# Patient Record
Sex: Male | Born: 1965 | Hispanic: Yes | Marital: Single | Smoking: Never smoker
Health system: Southern US, Community
[De-identification: ages and names within clinical notes are randomized; demographics above are authoritative.]

## PROBLEM LIST (undated history)

## (undated) DIAGNOSIS — E119 Type 2 diabetes mellitus without complications: Secondary | ICD-10-CM

---

## 2020-01-12 ENCOUNTER — Inpatient Hospital Stay
Admission: EM | Admit: 2020-01-12 | Discharge: 2020-01-16 | DRG: 439 | Disposition: A | Discharge status: Home Health | Payer: Self-pay | Attending: Internal Medicine | Admitting: Internal Medicine

## 2020-01-12 ENCOUNTER — Emergency Department: Payer: Self-pay

## 2020-01-12 DIAGNOSIS — E1165 Type 2 diabetes mellitus with hyperglycemia: Secondary | ICD-10-CM | POA: Diagnosis present

## 2020-01-12 DIAGNOSIS — K567 Ileus, unspecified: Secondary | ICD-10-CM | POA: Diagnosis present

## 2020-01-12 DIAGNOSIS — E440 Moderate protein-calorie malnutrition: Secondary | ICD-10-CM | POA: Diagnosis present

## 2020-01-12 DIAGNOSIS — R7989 Other specified abnormal findings of blood chemistry: Secondary | ICD-10-CM | POA: Diagnosis present

## 2020-01-12 DIAGNOSIS — N179 Acute kidney failure, unspecified: Secondary | ICD-10-CM | POA: Diagnosis present

## 2020-01-12 DIAGNOSIS — F10129 Alcohol abuse with intoxication, unspecified: Secondary | ICD-10-CM

## 2020-01-12 DIAGNOSIS — R739 Hyperglycemia, unspecified: Secondary | ICD-10-CM

## 2020-01-12 DIAGNOSIS — F10229 Alcohol dependence with intoxication, unspecified: Secondary | ICD-10-CM | POA: Diagnosis present

## 2020-01-12 DIAGNOSIS — Z20822 Contact with and (suspected) exposure to covid-19: Secondary | ICD-10-CM | POA: Diagnosis present

## 2020-01-12 DIAGNOSIS — K852 Alcohol induced acute pancreatitis without necrosis or infection: Principal | ICD-10-CM

## 2020-01-12 DIAGNOSIS — E871 Hypo-osmolality and hyponatremia: Secondary | ICD-10-CM

## 2020-01-12 DIAGNOSIS — E781 Pure hyperglyceridemia: Secondary | ICD-10-CM | POA: Diagnosis present

## 2020-01-12 DIAGNOSIS — D6959 Other secondary thrombocytopenia: Secondary | ICD-10-CM | POA: Diagnosis present

## 2020-01-12 DIAGNOSIS — R197 Diarrhea, unspecified: Secondary | ICD-10-CM | POA: Diagnosis present

## 2020-01-12 DIAGNOSIS — Y904 Blood alcohol level of 80-99 mg/100 ml: Secondary | ICD-10-CM | POA: Diagnosis present

## 2020-01-12 DIAGNOSIS — K859 Acute pancreatitis without necrosis or infection, unspecified: Secondary | ICD-10-CM | POA: Diagnosis present

## 2020-01-12 DIAGNOSIS — Z6824 Body mass index (BMI) 24.0-24.9, adult: Secondary | ICD-10-CM

## 2020-01-12 DIAGNOSIS — E872 Acidosis: Secondary | ICD-10-CM | POA: Diagnosis present

## 2020-01-12 DIAGNOSIS — K760 Fatty (change of) liver, not elsewhere classified: Secondary | ICD-10-CM | POA: Diagnosis present

## 2020-01-12 LAB — COMPREHENSIVE METABOLIC PANEL
ALT: 52 U/L — ABNORMAL HIGH (ref 0–44)
ALT: 56 U/L — ABNORMAL HIGH (ref 0–44)
ALT: 45 U/L — ABNORMAL HIGH (ref 0–44)
AST: 72 U/L — ABNORMAL HIGH (ref 15–41)
AST: 82 U/L — ABNORMAL HIGH (ref 15–41)
AST: 72 U/L — ABNORMAL HIGH (ref 15–41)
Albumin: 3.3 g/dL — ABNORMAL LOW (ref 3.5–5.0)
Albumin: 3.3 g/dL — ABNORMAL LOW (ref 3.5–5.0)
Albumin: 2.6 g/dL — ABNORMAL LOW (ref 3.5–5.0)
Alkaline Phosphatase: 156 U/L — ABNORMAL HIGH (ref 38–126)
Alkaline Phosphatase: 160 U/L — ABNORMAL HIGH (ref 38–126)
Alkaline Phosphatase: 127 U/L — ABNORMAL HIGH (ref 38–126)
Anion gap: 19 — ABNORMAL HIGH (ref 5–15)
Anion gap: 18 — ABNORMAL HIGH (ref 5–15)
Anion gap: 14 (ref 5–15)
BUN: 22 mg/dL — ABNORMAL HIGH (ref 6–20)
BUN: 23 mg/dL — ABNORMAL HIGH (ref 6–20)
BUN: 21 mg/dL — ABNORMAL HIGH (ref 6–20)
CO2: 15 mmol/L — ABNORMAL LOW (ref 22–32)
CO2: 21 mmol/L — ABNORMAL LOW (ref 22–32)
CO2: 19 mmol/L — ABNORMAL LOW (ref 22–32)
Calcium: 8.3 mg/dL — ABNORMAL LOW (ref 8.9–10.3)
Calcium: 8.1 mg/dL — ABNORMAL LOW (ref 8.9–10.3)
Calcium: 7.1 mg/dL — ABNORMAL LOW (ref 8.9–10.3)
Chloride: 97 mmol/L — ABNORMAL LOW (ref 98–111)
Chloride: 93 mmol/L — ABNORMAL LOW (ref 98–111)
Chloride: 101 mmol/L (ref 98–111)
Creatinine, Ser: 0.77 mg/dL (ref 0.61–1.24)
Creatinine, Ser: 0.76 mg/dL (ref 0.61–1.24)
Creatinine, Ser: 0.69 mg/dL (ref 0.61–1.24)
GFR, Estimated: >60 mL/min (ref >60)
GFR, Estimated: >60 mL/min (ref >60)
GFR, Estimated: >60 mL/min (ref >60)
Glucose, Bld: 341 mg/dL — ABNORMAL HIGH (ref 70–99)
Glucose, Bld: 311 mg/dL — ABNORMAL HIGH (ref 70–99)
Glucose, Bld: 218 mg/dL — ABNORMAL HIGH (ref 70–99)
Potassium: 4 mmol/L (ref 3.5–5.1)
Potassium: 3.8 mmol/L (ref 3.5–5.1)
Potassium: 3.8 mmol/L (ref 3.5–5.1)
Sodium: 131 mmol/L — ABNORMAL LOW (ref 135–145)
Sodium: 132 mmol/L — ABNORMAL LOW (ref 135–145)
Sodium: 134 mmol/L — ABNORMAL LOW (ref 135–145)
Total Bilirubin: 1.6 mg/dL — ABNORMAL HIGH (ref 0.3–1.2)
Total Bilirubin: 1.8 mg/dL — ABNORMAL HIGH (ref 0.3–1.2)
Total Bilirubin: 1.7 mg/dL — ABNORMAL HIGH (ref 0.3–1.2)
Total Protein: 6.4 g/dL — ABNORMAL LOW (ref 6.5–8.1)
Total Protein: 6.8 g/dL (ref 6.5–8.1)
Total Protein: 5.3 g/dL — ABNORMAL LOW (ref 6.5–8.1)

## 2020-01-12 LAB — CBC
HCT: 38.2 % — ABNORMAL LOW (ref 39.0–52.0)
HCT: 37.7 % — ABNORMAL LOW (ref 39.0–52.0)
Hemoglobin: 13.9 g/dL (ref 13.0–17.0)
Hemoglobin: 13.6 g/dL (ref 13.0–17.0)
MCH: 34.2 pg — ABNORMAL HIGH (ref 26.0–34.0)
MCH: 34.3 pg — ABNORMAL HIGH (ref 26.0–34.0)
MCHC: 36.4 g/dL — ABNORMAL HIGH (ref 30.0–36.0)
MCHC: 36.1 g/dL — ABNORMAL HIGH (ref 30.0–36.0)
MCV: 93.9 fL (ref 80.0–100.0)
MCV: 95 fL (ref 80.0–100.0)
Platelets: 204 10*3/uL (ref 150–400)
Platelets: 173 10*3/uL (ref 150–400)
RBC: 4.07 MIL/uL — ABNORMAL LOW (ref 4.22–5.81)
RBC: 3.97 MIL/uL — ABNORMAL LOW (ref 4.22–5.81)
RDW: 13.1 % (ref 11.5–15.5)
RDW: 13.2 % (ref 11.5–15.5)
WBC: 5.9 10*3/uL (ref 4.0–10.5)
WBC: 8.1 10*3/uL (ref 4.0–10.5)
nRBC: 0 % (ref 0.0–0.2)
nRBC: 0 % (ref 0.0–0.2)

## 2020-01-12 LAB — URINALYSIS, COMPLETE (UACMP) WITH MICROSCOPIC
Bacteria, UA: NONE SEEN
Bilirubin Urine: NEGATIVE
Glucose, UA: >=500 mg/dL — AB
Hgb urine dipstick: NEGATIVE
Ketones, ur: NEGATIVE mg/dL
Leukocytes,Ua: NEGATIVE
Nitrite: NEGATIVE
Protein, ur: NEGATIVE mg/dL
Specific Gravity, Urine: 1.022 (ref 1.005–1.030)
pH: 5 (ref 5.0–8.0)

## 2020-01-12 LAB — PHOSPHORUS: Phosphorus: 3 mg/dL (ref 2.5–4.6)

## 2020-01-12 LAB — MAGNESIUM: Magnesium: 1.9 mg/dL (ref 1.7–2.4)

## 2020-01-12 LAB — ETHANOL: Alcohol, Ethyl (B): 96 mg/dL — ABNORMAL HIGH (ref <10)

## 2020-01-12 LAB — LIPASE, BLOOD: Lipase: 726 U/L — ABNORMAL HIGH (ref 11–51)

## 2020-01-12 LAB — CBG MONITORING, ED: Glucose-Capillary: 206 mg/dL — ABNORMAL HIGH (ref 70–99)

## 2020-01-12 LAB — RESPIRATORY PANEL BY RT PCR (FLU A&B, COVID)
Influenza A by PCR: NEGATIVE
Influenza B by PCR: NEGATIVE
SARS Coronavirus 2 by RT PCR: NEGATIVE

## 2020-01-12 IMAGING — CT CT ABD-PELV W/ CM
2 of 5 series · 15 of 46 positions shown, 17 images · IV contrast (omnipaque)
Comparison: None.

CLINICAL DATA: Diffuse abdominal pain, nausea and vomiting

EXAM:
CT ABDOMEN AND PELVIS WITH CONTRAST
TECHNIQUE: Multidetector CT imaging of the abdomen and pelvis was performed
using the standard protocol following bolus administration of
intravenous contrast.
CONTRAST:  100mL OMNIPAQUE IOHEXOL 300 MG/ML  SOLN

[Series 2: routine abd/pel with · axial · 0.70mm/px · z∈[-816,-391]mm · 12 of 95 slices shown, 14 images]
[im 5/95  soft-tissue]
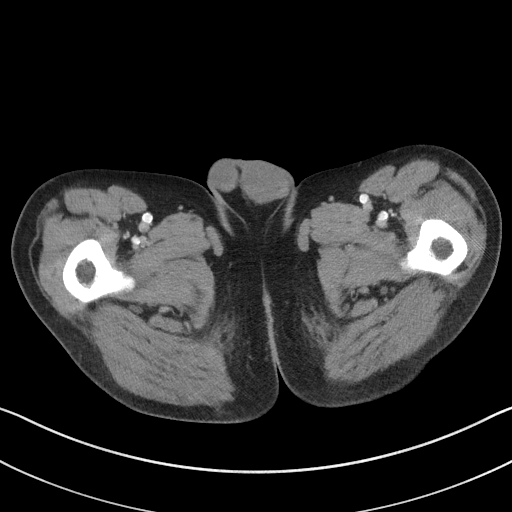
[im 5/95  bone]
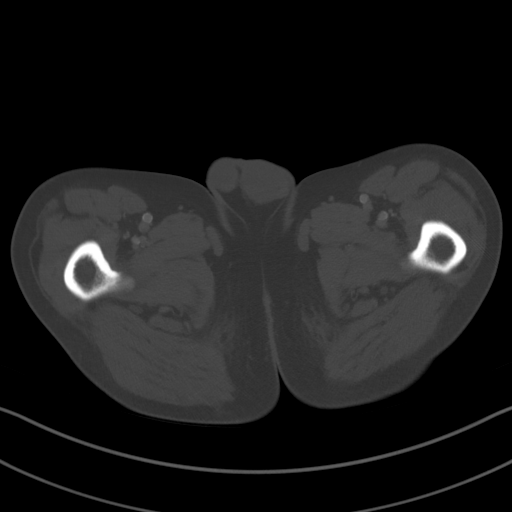
[im 15/95  soft-tissue]
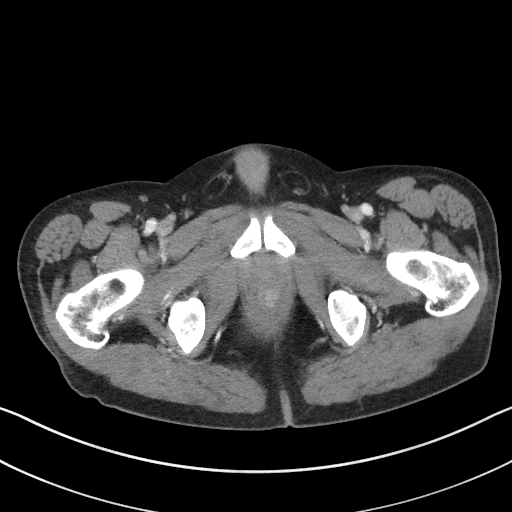
[im 20/95  soft-tissue]
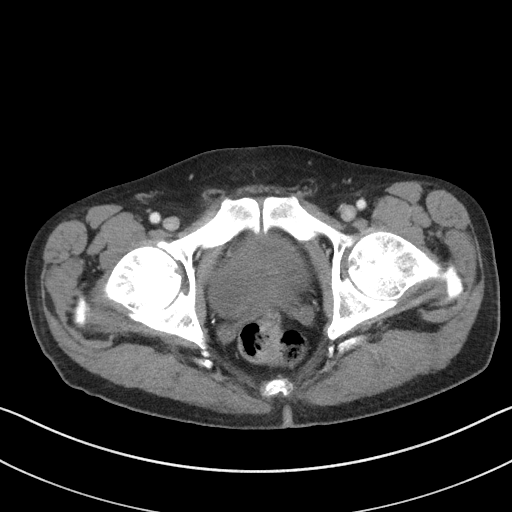
[im 30/95  soft-tissue]
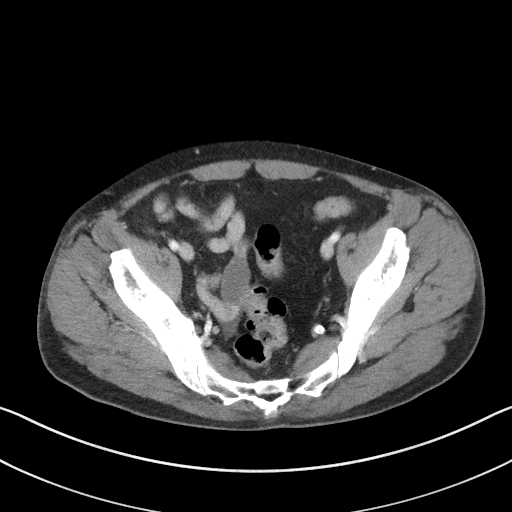
[im 35/95  soft-tissue]
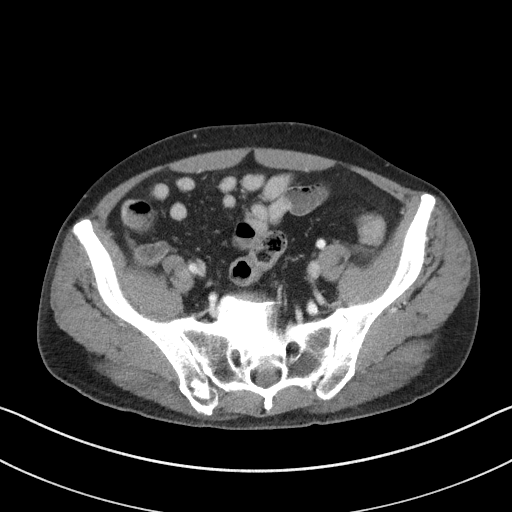
[im 45/95  soft-tissue]
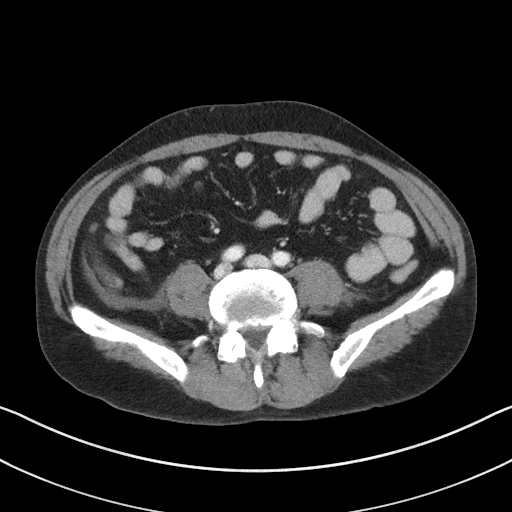
[im 50/95  soft-tissue]
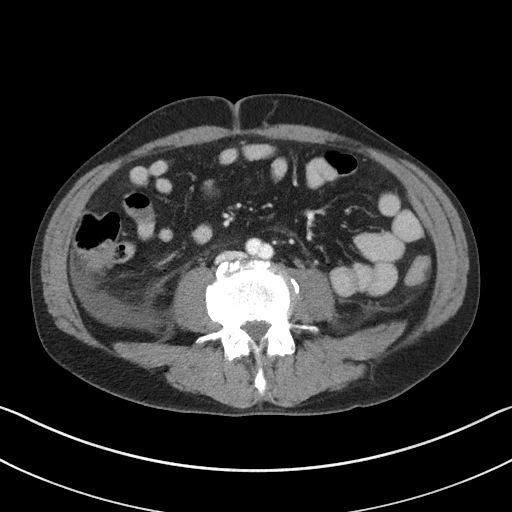
[im 60/95  soft-tissue]
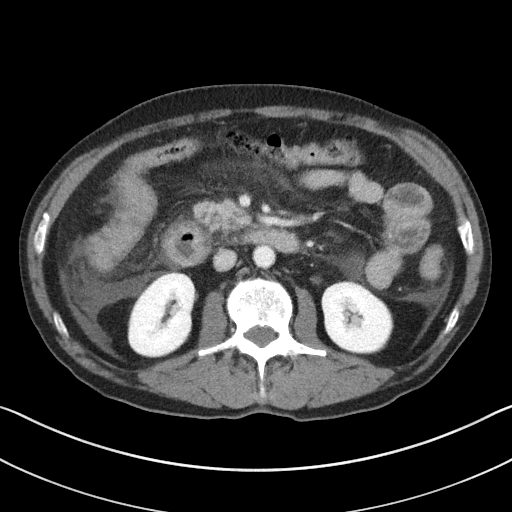
[im 65/95  soft-tissue]
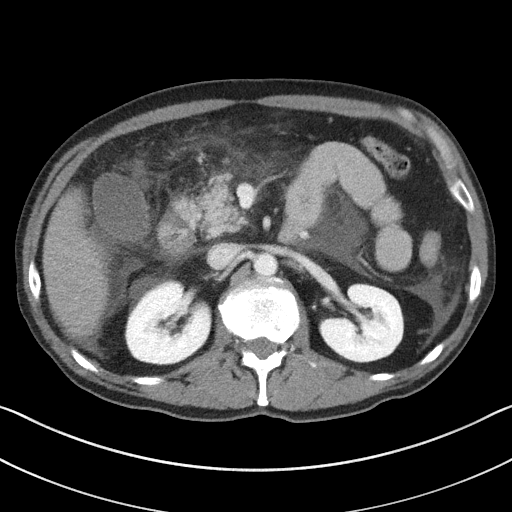
[im 65/95  bone]
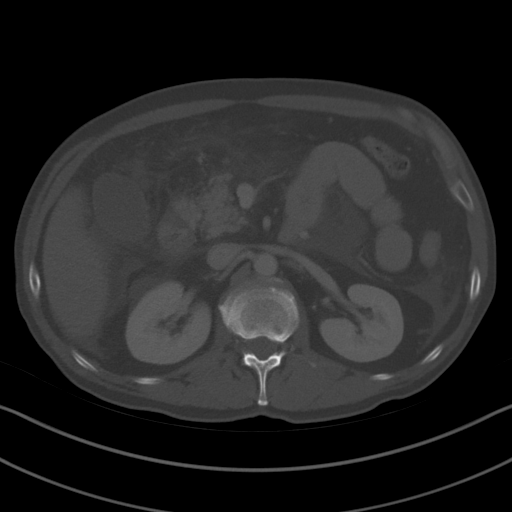
[im 75/95  soft-tissue]
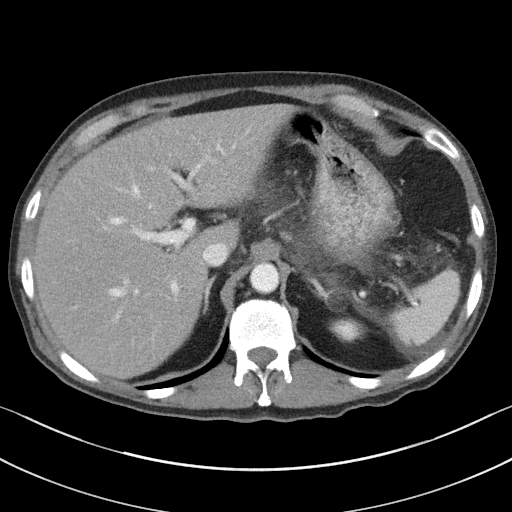
[im 80/95  soft-tissue]
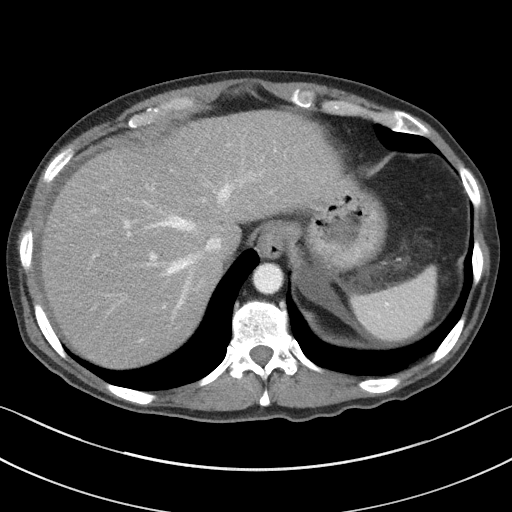
[im 90/95  soft-tissue]
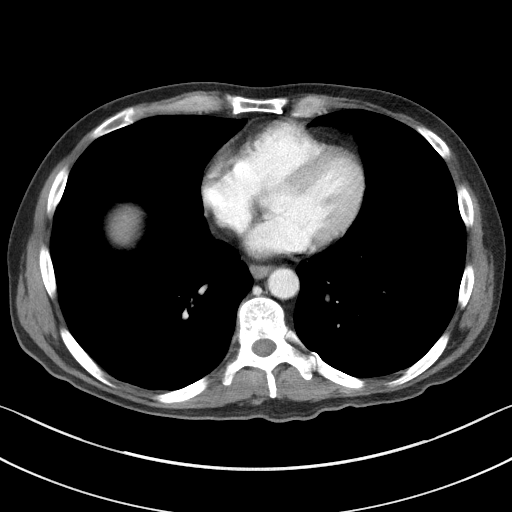

[Series 5: coronal st · coronal · 0.70mm/px · 3 of 86 slices shown]
[im 29/86  soft-tissue]
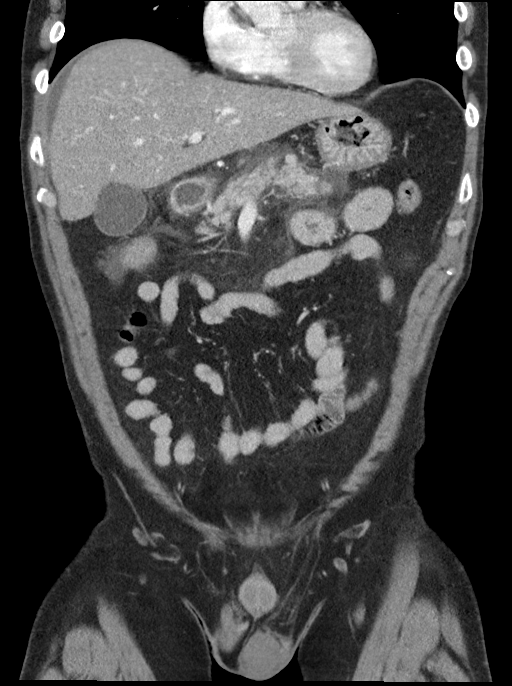
[im 38/86  soft-tissue]
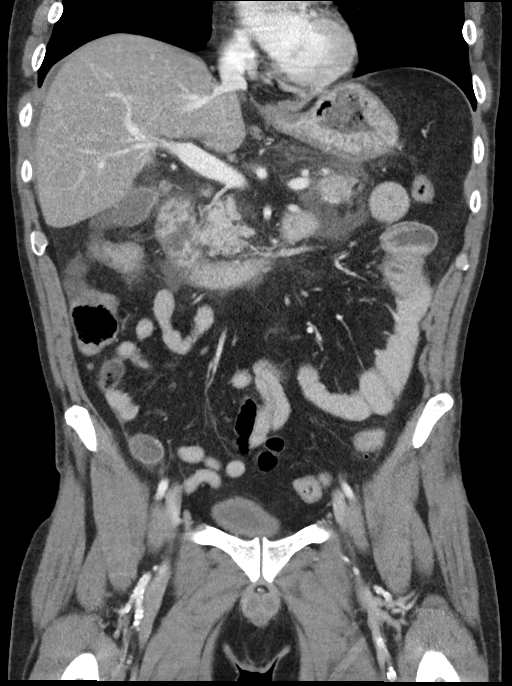
[im 48/86  soft-tissue]
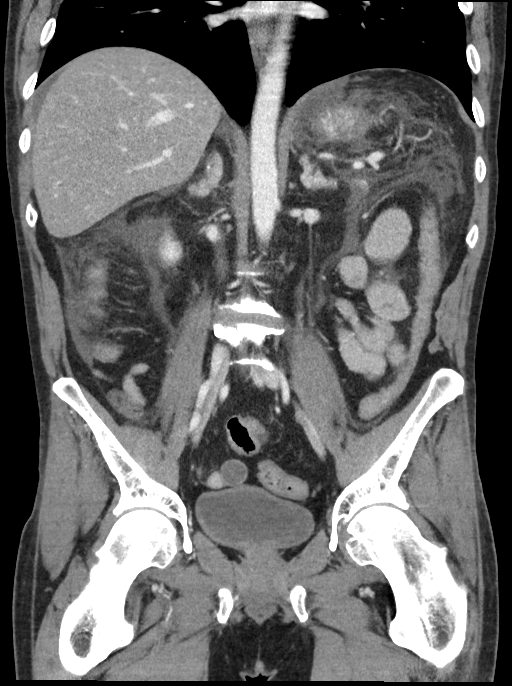

[15 of 46 positions shown; findings below may reference images not displayed]

FINDINGS: Lower chest: No acute pleural or parenchymal lung disease.

Hepatobiliary: Diffuse hepatic steatosis. No focal liver
abnormality. The gallbladder is unremarkable.

Pancreas: There are marked inflammatory changes surrounding the
pancreas consistent with acute pancreatitis. The pancreatic
parenchyma enhances normally. No pancreatic duct dilation.

Spleen: Normal in size without focal abnormality.

Adrenals/Urinary Tract: 1.6 cm fat containing left adrenal nodule
consistent with adrenal myelolipoma. The right adrenal is
unremarkable. Kidneys enhance normally and symmetrically. No urinary
tract calculi or obstruction. The bladder is unremarkable.

Stomach/Bowel: Mildly dilated loops of proximal small bowel, with
associated mild wall thickening, likely reflect localized ileus
given the adjacent inflammatory changes from pancreatitis. No
high-grade obstruction. Normal appendix right lower quadrant.

Vascular/Lymphatic: Aortic atherosclerosis. No enlarged abdominal or
pelvic lymph nodes.

Reproductive: Prostate is unremarkable.

Other: Moderate ascites is seen within the upper abdomen, related to
the acute pancreatitis described above. No fluid collection or
abscess. No free intraperitoneal gas. No abdominal wall hernia.

Musculoskeletal: No acute or destructive bony lesions. Reconstructed
images demonstrate no additional findings.
IMPRESSION: 1. Findings consistent with acute pancreatitis. There is
peripancreatic mesenteric fat stranding and free fluid throughout
the upper abdomen. No localized fluid collection, abscess, or
pseudocyst.
2. Mild proximal jejunal wall thickening and dilation consistent
with localized ileus.
3. Diffuse hepatic steatosis.
4.  Aortic Atherosclerosis ([F8]-[F8]).

## 2020-01-12 MED ORDER — MORPHINE SULFATE (PF) 4 MG/ML IV SOLN: 4.0000 mg | Freq: Once | INTRAVENOUS | Status: AC

## 2020-01-12 MED ORDER — MORPHINE SULFATE (PF) 4 MG/ML IV SOLN
4.0000 mg | Freq: Once | INTRAVENOUS | Status: AC
  Administered 2020-01-12: 4 mg via INTRAVENOUS
  Filled 2020-01-12: qty 1

## 2020-01-12 MED ORDER — LACTATED RINGERS IV SOLN: INTRAVENOUS | Status: DC

## 2020-01-12 MED ORDER — FOLIC ACID 1 MG PO TABS
1.0000 mg | ORAL_TABLET | Freq: Every day | ORAL | Status: DC
  Administered 2020-01-12 – 2020-01-16 (×5): 1 mg via ORAL
  Filled 2020-01-12 (×5): qty 1

## 2020-01-12 MED ORDER — ONDANSETRON HCL 4 MG/2ML IJ SOLN
4.0000 mg | Freq: Once | INTRAMUSCULAR | Status: AC
  Administered 2020-01-12: 4 mg via INTRAVENOUS
  Filled 2020-01-12: qty 2

## 2020-01-12 MED ORDER — IOHEXOL 300 MG/ML  SOLN
100.0000 mL | Freq: Once | INTRAMUSCULAR | Status: AC | PRN
  Administered 2020-01-12: 100 mL via INTRAVENOUS

## 2020-01-12 MED ORDER — SODIUM CHLORIDE 0.9 % IV BOLUS
1000.0000 mL | Freq: Once | INTRAVENOUS | Status: AC
  Administered 2020-01-12: 1000 mL via INTRAVENOUS

## 2020-01-12 MED ORDER — LORAZEPAM 2 MG/ML IJ SOLN
1.0000 mg | INTRAMUSCULAR | Status: AC | PRN
  Administered 2020-01-12: 2 mg via INTRAVENOUS
  Administered 2020-01-13: 1 mg via INTRAVENOUS
  Filled 2020-01-12 (×2): qty 1

## 2020-01-12 MED ORDER — LORAZEPAM 1 MG PO TABS: 1.0000 mg | ORAL_TABLET | ORAL | Status: AC | PRN

## 2020-01-12 MED ORDER — ADULT MULTIVITAMIN W/MINERALS CH
1.0000 | ORAL_TABLET | Freq: Every day | ORAL | Status: DC
  Administered 2020-01-12 – 2020-01-16 (×5): 1 via ORAL
  Filled 2020-01-12 (×5): qty 1

## 2020-01-12 MED ORDER — SODIUM CHLORIDE 0.9 % IV SOLN
1000.0000 mL | Freq: Once | INTRAVENOUS | Status: AC
  Administered 2020-01-12: 1000 mL via INTRAVENOUS

## 2020-01-12 MED ORDER — MORPHINE SULFATE (PF) 4 MG/ML IV SOLN
INTRAVENOUS | Status: AC
  Administered 2020-01-12: 4 mg via INTRAVENOUS
  Filled 2020-01-12: qty 1

## 2020-01-12 MED ORDER — THIAMINE HCL 100 MG PO TABS
100.0000 mg | ORAL_TABLET | Freq: Every day | ORAL | Status: DC
  Administered 2020-01-12 – 2020-01-16 (×5): 100 mg via ORAL
  Filled 2020-01-12 (×5): qty 1

## 2020-01-12 MED ORDER — ONDANSETRON HCL 4 MG/2ML IJ SOLN: 4.0000 mg | Freq: Four times a day (QID) | INTRAMUSCULAR | Status: DC | PRN

## 2020-01-12 MED ORDER — INSULIN ASPART 100 UNIT/ML ~~LOC~~ SOLN
0.0000 [IU] | Freq: Every day | SUBCUTANEOUS | Status: DC
  Administered 2020-01-12 – 2020-01-15 (×2): 2 [IU] via SUBCUTANEOUS
  Filled 2020-01-12 (×2): qty 1

## 2020-01-12 MED ORDER — ONDANSETRON HCL 4 MG PO TABS: 4.0000 mg | ORAL_TABLET | Freq: Four times a day (QID) | ORAL | Status: DC | PRN

## 2020-01-12 MED ORDER — THIAMINE HCL 100 MG/ML IJ SOLN: 100.0000 mg | Freq: Every day | INTRAMUSCULAR | Status: DC

## 2020-01-12 MED ORDER — ENOXAPARIN SODIUM 40 MG/0.4ML ~~LOC~~ SOLN
40.0000 mg | SUBCUTANEOUS | Status: DC
  Administered 2020-01-12 – 2020-01-15 (×4): 40 mg via SUBCUTANEOUS
  Filled 2020-01-12 (×4): qty 0.4

## 2020-01-12 MED ORDER — INSULIN ASPART 100 UNIT/ML ~~LOC~~ SOLN
0.0000 [IU] | Freq: Three times a day (TID) | SUBCUTANEOUS | Status: DC
  Administered 2020-01-13 – 2020-01-14 (×3): 2 [IU] via SUBCUTANEOUS
  Administered 2020-01-15: 5 [IU] via SUBCUTANEOUS
  Administered 2020-01-15: 3 [IU] via SUBCUTANEOUS
  Administered 2020-01-16: 2 [IU] via SUBCUTANEOUS
  Administered 2020-01-16: 7 [IU] via SUBCUTANEOUS
  Filled 2020-01-12 (×7): qty 1

## 2020-01-12 MED ORDER — HYDROMORPHONE HCL 1 MG/ML IJ SOLN
0.5000 mg | INTRAMUSCULAR | Status: DC | PRN
  Administered 2020-01-12 – 2020-01-13 (×2): 1 mg via INTRAVENOUS
  Filled 2020-01-12 (×2): qty 1

## 2020-01-12 NOTE — ED Triage Notes (Signed)
Through interpretor patient states abdominal pain located all over the abdomen.Onset yesterday and states nausea and vomiting x 4 yesterday and once today. Denies diarrhea. Drank alcohol yesterday.

## 2020-01-12 NOTE — ED Provider Notes (Signed)
[facility] Emergency Department Provider Note  ____________________________________________   First MD Initiated Contact with Patient 01/12/20 1615     (approximate)  I have reviewed the triage vital signs and the nursing notes.   HISTORY  Chief Complaint Abdominal Pain    HPI Cody Cowan is a 54 y.o. male presents emergency department with abdominal pain, nausea/vomiting/diarrhea.  Patient is a known noncompliant diabetic.  States he has not had any medication since he came here from [place].  States he just arrived.  States he normally takes pills.  Has never been on insulin.  No history of pancreatitis or DKA.  Patient states he drank a 12 pack of beer yesterday.  He then had vomiting and diarrhea.  He is complaining of a headache.  He denies fever but does have chills.    History reviewed. No pertinent past medical history.  There are no problems to display for this patient.   History reviewed. No pertinent surgical history.  Prior to Admission medications   Not on File    Allergies Patient has no known allergies.  History reviewed. No pertinent family history.  Social History Social History   Tobacco Use  . Smoking status: Never Smoker  . Smokeless tobacco: Never Used  Substance Use Topics  . Alcohol use: Not Currently  . Drug use: Not on file    Review of Systems  Constitutional: No fever/positive chills Eyes: No visual changes. ENT: No sore throat. Respiratory: Denies cough Cardiovascular: Denies chest pain Gastrointestinal: Positive abdominal pain Genitourinary: Negative for dysuria. Musculoskeletal: Negative for back pain. Skin: Negative for rash. Psychiatric: no mood changes,     ____________________________________________   PHYSICAL EXAM:  VITAL SIGNS: ED Triage Vitals  Enc Vitals Group     BP 01/12/20 1439 90/68     Pulse Rate 01/12/20 1439 (!) 117     Resp 01/12/20 1439 (!) 22     Temp  01/12/20 1439 99 F (37.2 C)     Temp Source 01/12/20 1439 Oral     SpO2 01/12/20 1439 100 %     Weight 01/12/20 1443 148 lb (67.1 kg)     Height 01/12/20 1443 5' 4.96" (1.65 m)     Head Circumference --      Peak Flow --      Pain Score 01/12/20 1439 10     Pain Loc --      Pain Edu? --      Excl. in GC? --     Constitutional: Alert and oriented. Well appearing and in no acute distress.  Patient is shaking with chills Eyes: Conjunctivae are normal.  Head: Atraumatic. Nose: No congestion/rhinnorhea. Mouth/Throat: Mucous membranes are moist.   Neck:  supple no lymphadenopathy noted Cardiovascular: Tachycardic, regular rhythm. Heart sounds are normal Respiratory: Normal respiratory effort.  No retractions, lungs c t a  Abd: soft tender in all quadrants bs normal all 4 quad GU: deferred Musculoskeletal: FROM all extremities, warm and well perfused Neurologic:  Normal speech and language.  Skin:  Skin is warm, dry and intact. No rash noted. Psychiatric: Mood and affect are normal. Speech and behavior are normal.  ____________________________________________   LABS (all labs ordered are listed, but only abnormal results are displayed)  Labs Reviewed  LIPASE, BLOOD - Abnormal; Notable for the following components:      Result Value   Lipase 726 (*)    All other components within normal limits  COMPREHENSIVE METABOLIC PANEL - Abnormal; Notable  for the following components:   Sodium 131 (*)    Chloride 97 (*)    CO2 15 (*)    Glucose, Bld 341 (*)    BUN 22 (*)    Calcium 8.3 (*)    Total Protein 6.4 (*)    Albumin 3.3 (*)    AST 72 (*)    ALT 52 (*)    Alkaline Phosphatase 156 (*)    Total Bilirubin 1.6 (*)    Anion gap 19 (*)    All other components within normal limits  CBC - Abnormal; Notable for the following components:   RBC 4.07 (*)    HCT 38.2 (*)    MCH 34.2 (*)    MCHC 36.4 (*)    All other components within normal limits  URINALYSIS, COMPLETE (UACMP)  WITH MICROSCOPIC - Abnormal; Notable for the following components:   Color, Urine YELLOW (*)    APPearance HAZY (*)    Glucose, UA >=500 (*)    All other components within normal limits  RESPIRATORY PANEL BY RT PCR (FLU A&B, COVID)  BLOOD GAS, VENOUS  COMPREHENSIVE METABOLIC PANEL  ETHANOL   ____________________________________________   ____________________________________________  RADIOLOGY  CT abdomen/pelvis with IV contrast  ____________________________________________   PROCEDURES  Procedure(s) performed: No  Procedures    ____________________________________________   INITIAL IMPRESSION / ASSESSMENT AND PLAN / ED COURSE  Pertinent labs & imaging results that were available during my care of the patient were reviewed by me and considered in my medical decision making (see chart for details).   Patient is a 54 year old Hispanic male with history of diabetes presents emergency department vomiting diarrhea after drinking a 12 pack of beer yesterday.  Patient states he has diffuse abdominal pain.  See HPI.  Physical exam shows patient to have chills, is tachycardic, abdomen is tender throughout all 4 quadrants.  Decreased bowel sounds also noted.  DDx: Pancreatitis, acute cholecystitis, DKA, bowel obstruction  CBC basically normal, lipase elevated at 726 to confirm pancreatitis, comprehensive metabolic panel has low sodium elevated glucose and BUN, elevated LFTs, and elevated anion gap.  Urinalysis shows greater than 50 glucose, EtOH level and Covid test along with VBG pending.  I did discuss with Dr. Cyril Loosen.  Patient will be moved to the major side.  We did start fluids and pain medication follow-up back here in this area.      Cody Cowan was evaluated in Emergency Department on 01/12/2020 for the symptoms described in the history of present illness. He was evaluated in the context of the global COVID-19 pandemic, which necessitated consideration that  the patient might be at risk for infection with the SARS-CoV-2 virus that causes COVID-19. Institutional protocols and algorithms that pertain to the evaluation of patients at risk for COVID-19 are in a state of rapid change based on information released by regulatory bodies including the CDC and federal and state organizations. These policies and algorithms were followed during the patient's care in the ED.    As part of my medical decision making, I reviewed the following data within the electronic MEDICAL RECORD NUMBER Nursing notes reviewed and incorporated, Interpreter needed, Labs reviewed , Old chart reviewed, Patient signed out to Dr. Cyril Loosen, Evaluated by EM attending , Notes from prior ED visits and [place] Controlled Substance Database  ____________________________________________   FINAL CLINICAL IMPRESSION(S) / ED DIAGNOSES  Final diagnoses:  Alcohol-induced acute pancreatitis, unspecified complication status      NEW MEDICATIONS STARTED DURING THIS VISIT:  New Prescriptions   No medications on file     Note:  This document was prepared using Dragon voice recognition software and may include unintentional dictation errors.    Faythe Ghee, PA-C 01/12/20 1710    Jene Every, MD 01/12/20 Garnette Scheuermann    Jene Every, MD 01/12/20 Clarisa Fling    Jene Every, MD 01/12/20 2036

## 2020-01-12 NOTE — ED Notes (Signed)
Patient transported to CT 

## 2020-01-12 NOTE — H&P (Signed)
History and Physical   Cody Cowan QBV:[id number] DOB: 14-Jan-1966 DOA: 01/12/2020  Referring MD/NP/PA: Dr. Cyril Loosen  PCP: Patient, No Pcp Per   Outpatient Specialists: None  Patient coming from: Home  Chief Complaint: Abdominal pain nausea with vomiting  HPI: Cody Cowan is a 54 y.o. male with medical history significant of diabetes who has not been on any medication, recent alcohol abuse who presents to the ER with persistent epigastric pain rated as 10 out of 10 on arrival with intractable nausea vomiting lasting 2 days.  Patient reports binge drinking recently.  No prior abdominal problems.  No known liver or pancreatic disease.  He was found to be in acute pancreatitis with pseudohyponatremia and elevated glucose.  Denied any family history of gallstones or gallbladder disease.  Patient additionally suspected to have had possible gallstones.  He recently moved over from [place] and has not established care with any physician.  He is being admitted with most likely alcoholic pancreatitis.  He denied fever or chills.  Denied any exposure to Covid..  ED Course: Temperature is 99 blood pressure 162/89 initially pulse 128 respirate of 22 oxygen sat 98% room air.  Sodium 132 potassium 3.8 chloride 93 CO2 21 glucose 311 BUN 23 creatinine 1.76.  His anion gap of 18 with calcium 8.1.  CBC is entirely within normal.  COVID-19 screen is negative alcohol level of 96.  CT abdomen pelvis shows findings consistent with acute pancreatitis.  There is peripancreatic mesenteric fat stranding and free fluid throughout the upper abdomen no abscess.  There is also mild proximal jejunal thickening and dilatation consistent with localized ileus with diffuse hepatic steatosis.  Patient being admitted with acute pancreatitis most likely alcoholic.  Review of Systems: As per HPI otherwise 10 point review of systems negative.    History reviewed. No pertinent past medical history.  History  reviewed. No pertinent surgical history.   reports that he has never smoked. He has never used smokeless tobacco. He reports previous alcohol use. No history on file for drug use.  No Known Allergies  History reviewed. No pertinent family history.   Prior to Admission medications   Not on File    Physical Exam: Vitals:   01/12/20 1740 01/12/20 1820 01/12/20 1830 01/12/20 1900  BP: (!) 141/85 (!) 162/89 (!) 143/89 106/76  Pulse: (!) 122 (!) 124 (!) 121 (!) 118  Resp: 20 20    Temp:  98.5 F (36.9 C)    TempSrc:  Oral    SpO2: 99% 98% 99% 100%  Weight:      Height:          Constitutional: Acutely ill looking, in fetal position, and pain Vitals:   01/12/20 1740 01/12/20 1820 01/12/20 1830 01/12/20 1900  BP: (!) 141/85 (!) 162/89 (!) 143/89 106/76  Pulse: (!) 122 (!) 124 (!) 121 (!) 118  Resp: 20 20    Temp:  98.5 F (36.9 C)    TempSrc:  Oral    SpO2: 99% 98% 99% 100%  Weight:      Height:       Eyes: PERRL, lids and conjunctivae normal ENMT: Mucous membranes are dry. Posterior pharynx clear of any exudate or lesions.Normal dentition.  Neck: normal, supple, no masses, no thyromegaly Respiratory: clear to auscultation bilaterally, no wheezing, no crackles. Normal respiratory effort. No accessory muscle use.  Cardiovascular: Sinus tachycardia, no murmurs / rubs / gallops. No extremity edema. 2+ pedal pulses. No carotid bruits.  Abdomen: Significant tenderness  with mild rebound in the epigastric region, no masses palpated. No hepatosplenomegaly. Bowel sounds positive.  Musculoskeletal: no clubbing / cyanosis. No joint deformity upper and lower extremities. Good ROM, no contractures. Normal muscle tone.  Skin: no rashes, lesions, ulcers. No induration Neurologic: CN 2-12 grossly intact. Sensation intact, DTR normal. Strength 5/5 in all 4.  Psychiatric: Normal judgment and insight. Alert and oriented x 3. Normal mood.     Labs on Admission: I have personally reviewed  following labs and imaging studies  CBC: Recent Labs  Lab 01/12/20 1446  WBC 5.9  HGB 13.9  HCT 38.2*  MCV 93.9  PLT 204   Basic Metabolic Panel: Recent Labs  Lab 01/12/20 1446 01/12/20 1652  NA 131* 132*  K 4.0 3.8  CL 97* 93*  CO2 15* 21*  GLUCOSE 341* 311*  BUN 22* 23*  CREATININE 0.77 0.76  CALCIUM 8.3* 8.1*   GFR: Estimated Creatinine Clearance: 92.7 mL/min (by C-G formula based on SCr of 0.76 mg/dL). Liver Function Tests: Recent Labs  Lab 01/12/20 1446 01/12/20 1652  AST 72* 82*  ALT 52* 56*  ALKPHOS 156* 160*  BILITOT 1.6* 1.8*  PROT 6.4* 6.8  ALBUMIN 3.3* 3.3*   Recent Labs  Lab 01/12/20 1446  LIPASE 726*   No results for input(s): AMMONIA in the last 168 hours. Coagulation Profile: No results for input(s): INR, PROTIME in the last 168 hours. Cardiac Enzymes: No results for input(s): CKTOTAL, CKMB, CKMBINDEX, TROPONINI in the last 168 hours. BNP (last 3 results) No results for input(s): PROBNP in the last 8760 hours. HbA1C: No results for input(s): HGBA1C in the last 72 hours. CBG: No results for input(s): GLUCAP in the last 168 hours. Lipid Profile: No results for input(s): CHOL, HDL, LDLCALC, TRIG, CHOLHDL, LDLDIRECT in the last 72 hours. Thyroid Function Tests: No results for input(s): TSH, T4TOTAL, FREET4, T3FREE, THYROIDAB in the last 72 hours. Anemia Panel: No results for input(s): VITAMINB12, FOLATE, FERRITIN, TIBC, IRON, RETICCTPCT in the last 72 hours. Urine analysis:    Component Value Date/Time   COLORURINE YELLOW (A) 01/12/2020 1446   APPEARANCEUR HAZY (A) 01/12/2020 1446   LABSPEC 1.022 01/12/2020 1446   PHURINE 5.0 01/12/2020 1446   GLUCOSEU >=500 (A) 01/12/2020 1446   HGBUR NEGATIVE 01/12/2020 1446   BILIRUBINUR NEGATIVE 01/12/2020 1446   KETONESUR NEGATIVE 01/12/2020 1446   PROTEINUR NEGATIVE 01/12/2020 1446   NITRITE NEGATIVE 01/12/2020 1446   LEUKOCYTESUR NEGATIVE 01/12/2020 1446   Sepsis  Labs: @LABRCNTIP (procalcitonin:4,lacticidven:4) ) Recent Results (from the past 240 hour(s))  Respiratory Panel by RT PCR (Flu A&B, Covid) - Nasopharyngeal Swab     Status: None   Collection Time: 01/12/20  6:21 PM   Specimen: Nasopharyngeal Swab  Result Value Ref Range Status   SARS Coronavirus 2 by RT PCR NEGATIVE NEGATIVE Final    Comment: (NOTE) SARS-CoV-2 target nucleic acids are NOT DETECTED.  The SARS-CoV-2 RNA is generally detectable in upper respiratoy specimens during the acute phase of infection. The lowest concentration of SARS-CoV-2 viral copies this assay can detect is 131 copies/mL. A negative result does not preclude SARS-Cov-2 infection and should not be used as the sole basis for treatment or other patient management decisions. A negative result may occur with  improper specimen collection/handling, submission of specimen other than nasopharyngeal swab, presence of viral mutation(s) within the areas targeted by this assay, and inadequate number of viral copies (<131 copies/mL). A negative result must be combined with clinical observations, patient history, and epidemiological  information. The expected result is Negative.  Fact Sheet for Patients:  https://www.moore.com/  Fact Sheet for Healthcare Providers:  https://www.young.biz/  This test is no t yet approved or cleared by the [place] FDA and  has been authorized for detection and/or diagnosis of SARS-CoV-2 by FDA under an Emergency Use Authorization (EUA). This EUA will remain  in effect (meaning this test can be used) for the duration of the COVID-19 declaration under Section 564(b)(1) of the Act, 21 U.S.C. section 360bbb-3(b)(1), unless the authorization is terminated or revoked sooner.     Influenza A by PCR NEGATIVE NEGATIVE Final   Influenza B by PCR NEGATIVE NEGATIVE Final    Comment: (NOTE) The Xpert Xpress SARS-CoV-2/FLU/RSV assay is intended as an  aid in  the diagnosis of influenza from Nasopharyngeal swab specimens and  should not be used as a sole basis for treatment. Nasal washings and  aspirates are unacceptable for Xpert Xpress SARS-CoV-2/FLU/RSV  testing.  Fact Sheet for Patients: https://www.moore.com/  Fact Sheet for Healthcare Providers: https://www.young.biz/  This test is not yet approved or cleared by the [place] FDA and  has been authorized for detection and/or diagnosis of SARS-CoV-2 by  FDA under an Emergency Use Authorization (EUA). This EUA will remain  in effect (meaning this test can be used) for the duration of the  Covid-19 declaration under Section 564(b)(1) of the Act, 21  U.S.C. section 360bbb-3(b)(1), unless the authorization is  terminated or revoked. Performed at [facility], [address] [place], [place] [address]      Radiological Exams on Admission: CT ABDOMEN PELVIS W CONTRAST  Result Date: 01/12/2020 CLINICAL DATA:  Diffuse abdominal pain, nausea and vomiting EXAM: CT ABDOMEN AND PELVIS WITH CONTRAST TECHNIQUE: Multidetector CT imaging of the abdomen and pelvis was performed using the standard protocol following bolus administration of intravenous contrast. CONTRAST:  OMNIPAQUE IOHEXOL 300 MG/ML  SOLN COMPARISON:  None. FINDINGS: Lower chest: No acute pleural or parenchymal lung disease. Hepatobiliary: Diffuse hepatic steatosis. No focal liver abnormality. The gallbladder is unremarkable. Pancreas: There are marked inflammatory changes surrounding the pancreas consistent with acute pancreatitis. The pancreatic parenchyma enhances normally. No pancreatic duct dilation. Spleen: Normal in size without focal abnormality. 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 The right adrenal is unremarkable. Kidneys enhance normally and symmetrically. No urinary tract calculi or obstruction.  The bladder is unremarkable. Stomach/Bowel: Mildly dilated loops of proximal small bowel, with associated mild wall thickening, likely reflect localized ileus given the adjacent inflammatory changes from pancreatitis. No high-grade obstruction. Normal appendix right lower quadrant. Vascular/Lymphatic: Aortic atherosclerosis. No enlarged abdominal or pelvic lymph nodes. Reproductive: Prostate is unremarkable. Other: Moderate ascites is seen within the upper abdomen, related to the acute pancreatitis described above. No fluid collection or abscess. No free intraperitoneal gas. No abdominal wall hernia. Musculoskeletal: No acute or destructive bony lesions. Reconstructed images demonstrate no additional findings. IMPRESSION: 1. Findings consistent with acute pancreatitis. There is peripancreatic mesenteric fat stranding and free fluid throughout the upper abdomen. No localized fluid collection, abscess, or pseudocyst. 2. Mild proximal jejunal wall thickening and dilation consistent with localized ileus. 3. Diffuse hepatic steatosis. 4.  Aortic Atherosclerosis (ICD10-I70.0). Electronically Signed   By: Sharlet Salina M.D.   On: 01/12/2020 18:38    EKG: Independently reviewed.  Sinus tachycardia otherwise no significant findings  Assessment/Plan Principal Problem:   Pancreatitis, acute Active Problems:   Alcohol abuse with intoxication (HCC)   Hyperglycemia  Hyponatremia     #1 acute pancreatitis: Suspected alcoholic pancreatitis.  Patient will have right upper quadrant abdominal ultrasound to rule out gallstones versus historically seems more alcoholic.  Initiate bowel rest Ringer's lactate pain control nausea control.  Check lipase daily.  Current lipase is 726.  #2 uncontrolled diabetes: Patient has not been on medication.  Will initiate subcutaneous insulin while here.  Monitor blood sugar closely.  Will need to get back on medications at discharge.  #3 alcohol abuse with intoxication: Initiate  CIWA protocol  #4 pseudohyponatremia: Most likely due to elevated glucose.  Continue close monitoring.  #5 elevated anion gap: Probably dehydration hyperchloremia.  Not in DKA.   DVT prophylaxis: Lovenox Code Status: Full code Family Communication: No family at bedside Disposition Plan: Home Consults called: None Admission status: Inpatient  Severity of Illness: The appropriate patient status for this patient is INPATIENT. Inpatient status is judged to be reasonable and necessary in order to provide the required intensity of service to ensure the patient's safety. The patient's presenting symptoms, physical exam findings, and initial radiographic and laboratory data in the context of their chronic comorbidities is felt to place them at high risk for further clinical deterioration. Furthermore, it is not anticipated that the patient will be medically stable for discharge from the hospital within 2 midnights of admission. The following factors support the patient status of inpatient.   " The patient's presenting symptoms include epigastric pain. " The worrisome physical exam findings include diffuse tenderness in the epigastric region. " The initial radiographic and laboratory data are worrisome because of elevated lipase and evidence of hyperglycemia. " The chronic co-morbidities include diabetes.   * I certify that at the point of admission it is my clinical judgment that the patient will require inpatient hospital care spanning beyond 2 midnights from the point of admission due to high intensity of service, high risk for further deterioration and high frequency of surveillance required.Lonia Blood*    Christeen Lai,LAWAL MD [facility] Pager [phone]  If 7PM-7AM, please contact night-coverage [email or web address] Password [email or web address]  01/12/2020, 7:49 PM

## 2020-01-13 ENCOUNTER — Other Ambulatory Visit: Payer: Self-pay

## 2020-01-13 ENCOUNTER — Inpatient Hospital Stay: Payer: Self-pay

## 2020-01-13 LAB — BLOOD GAS, VENOUS
Acid-base deficit: 1.8 mmol/L (ref 0.0–2.0)
Bicarbonate: 23.1 mmol/L (ref 20.0–28.0)
O2 Saturation: 35.9 %
Patient temperature: 37
pCO2, Ven: 39 mmHg — ABNORMAL LOW (ref 44.0–60.0)
pH, Ven: 7.38 (ref 7.250–7.430)

## 2020-01-13 LAB — COMPREHENSIVE METABOLIC PANEL
ALT: 42 U/L (ref 0–44)
AST: 68 U/L — ABNORMAL HIGH (ref 15–41)
Albumin: 2.6 g/dL — ABNORMAL LOW (ref 3.5–5.0)
Alkaline Phosphatase: 102 U/L (ref 38–126)
Anion gap: 11 (ref 5–15)
BUN: 28 mg/dL — ABNORMAL HIGH (ref 6–20)
CO2: 20 mmol/L — ABNORMAL LOW (ref 22–32)
Calcium: 7.2 mg/dL — ABNORMAL LOW (ref 8.9–10.3)
Chloride: 103 mmol/L (ref 98–111)
Creatinine, Ser: 1.24 mg/dL (ref 0.61–1.24)
GFR, Estimated: >60 mL/min (ref >60)
Glucose, Bld: 130 mg/dL — ABNORMAL HIGH (ref 70–99)
Potassium: 4 mmol/L (ref 3.5–5.1)
Sodium: 134 mmol/L — ABNORMAL LOW (ref 135–145)
Total Bilirubin: 2.3 mg/dL — ABNORMAL HIGH (ref 0.3–1.2)
Total Protein: 5.3 g/dL — ABNORMAL LOW (ref 6.5–8.1)

## 2020-01-13 LAB — CBC
HCT: 38.6 % — ABNORMAL LOW (ref 39.0–52.0)
Hemoglobin: 13.6 g/dL (ref 13.0–17.0)
MCH: 33.6 pg (ref 26.0–34.0)
MCHC: 35.2 g/dL (ref 30.0–36.0)
MCV: 95.3 fL (ref 80.0–100.0)
Platelets: 153 10*3/uL (ref 150–400)
RBC: 4.05 MIL/uL — ABNORMAL LOW (ref 4.22–5.81)
RDW: 13.5 % (ref 11.5–15.5)
WBC: 8 10*3/uL (ref 4.0–10.5)
nRBC: 0 % (ref 0.0–0.2)

## 2020-01-13 LAB — HEPATITIS PANEL, ACUTE
HCV Ab: NONREACTIVE
Hep A IgM: NONREACTIVE
Hep B C IgM: NONREACTIVE
Hepatitis B Surface Ag: NONREACTIVE

## 2020-01-13 LAB — LIPASE, BLOOD: Lipase: 289 U/L — ABNORMAL HIGH (ref 11–51)

## 2020-01-13 LAB — HEMOGLOBIN A1C
Hgb A1c MFr Bld: 11.2 % — ABNORMAL HIGH (ref 4.8–5.6)
Mean Plasma Glucose: 274.74 mg/dL

## 2020-01-13 LAB — GLUCOSE, CAPILLARY
Glucose-Capillary: 168 mg/dL — ABNORMAL HIGH (ref 70–99)
Glucose-Capillary: 168 mg/dL — ABNORMAL HIGH (ref 70–99)
Glucose-Capillary: 105 mg/dL — ABNORMAL HIGH (ref 70–99)

## 2020-01-13 LAB — HIV ANTIBODY (ROUTINE TESTING W REFLEX): HIV Screen 4th Generation wRfx: NONREACTIVE

## 2020-01-13 LAB — TRIGLYCERIDES: Triglycerides: 1915 mg/dL — ABNORMAL HIGH (ref <150)

## 2020-01-13 LAB — CBG MONITORING, ED: Glucose-Capillary: 154 mg/dL — ABNORMAL HIGH (ref 70–99)

## 2020-01-13 IMAGING — US US ABDOMEN LIMITED
1 series · 15 of 25 positions shown · non-contrast
Comparison: CT [DATE]

CLINICAL DATA: Pancreatitis

EXAM:
ULTRASOUND ABDOMEN LIMITED RIGHT UPPER QUADRANT

[Series 1: us abdomen limited ruq · 15 of 55 slices shown]
[im 1/55]
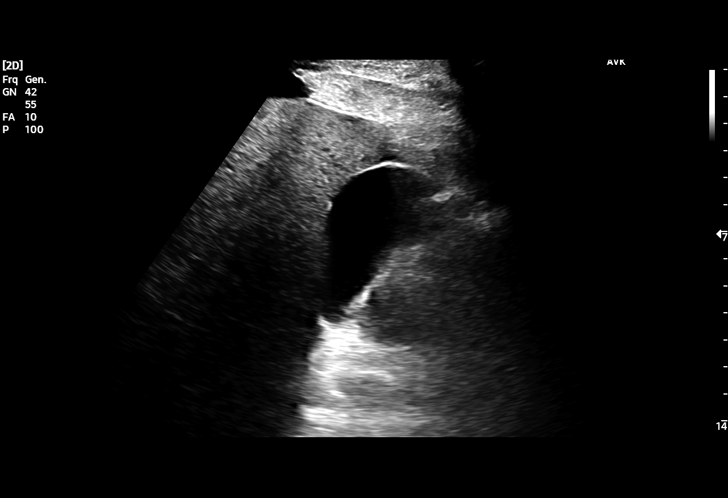
[im 5/55]
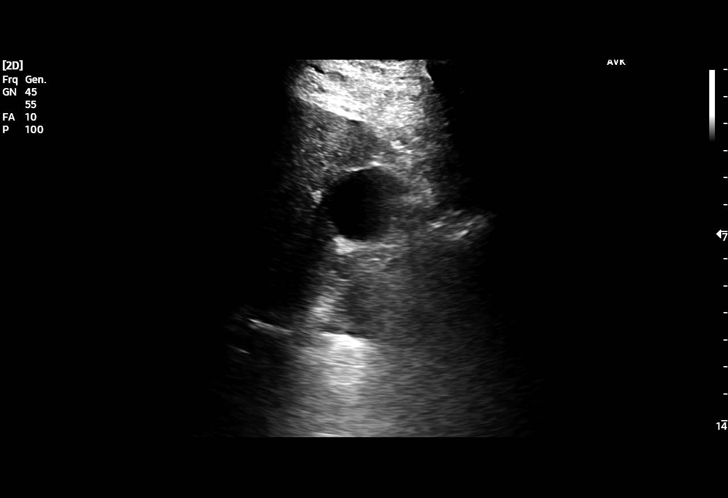
[im 10/55]
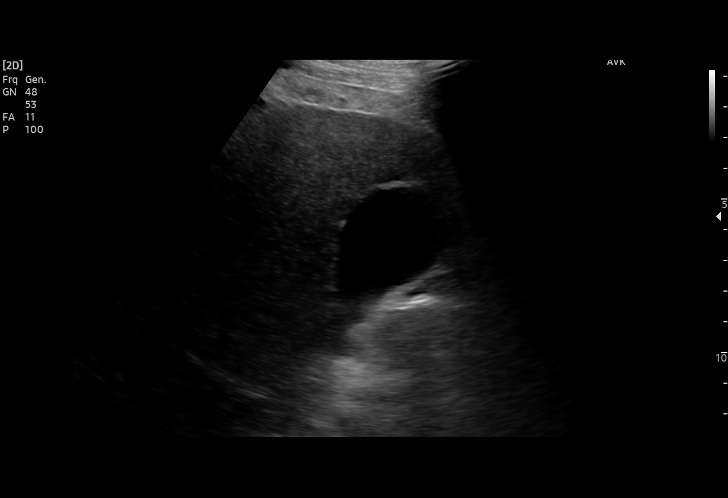
[im 12/55]
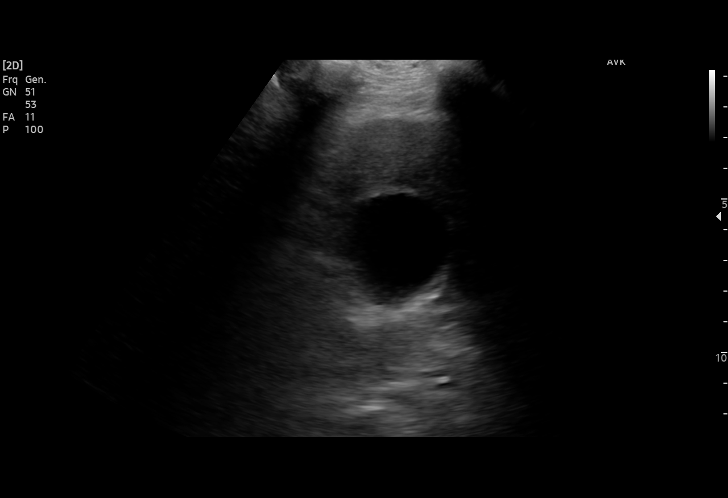
[im 16/55]
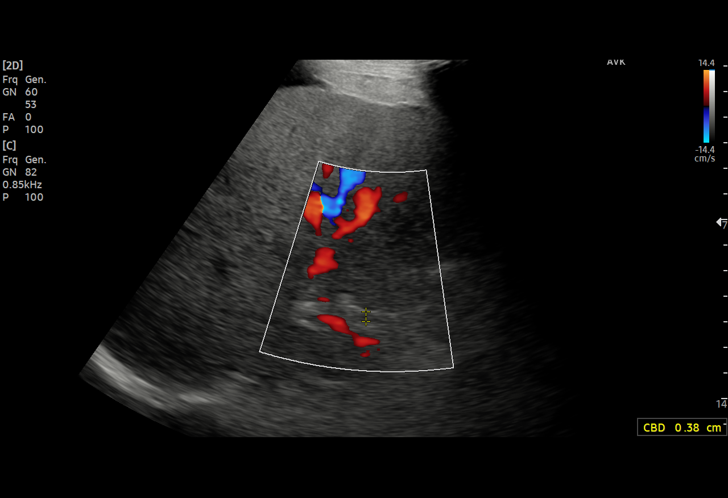
[im 21/55]
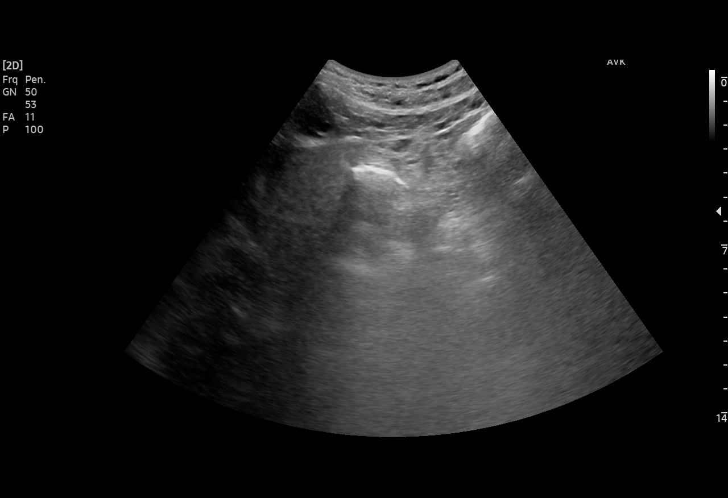
[im 23/55]
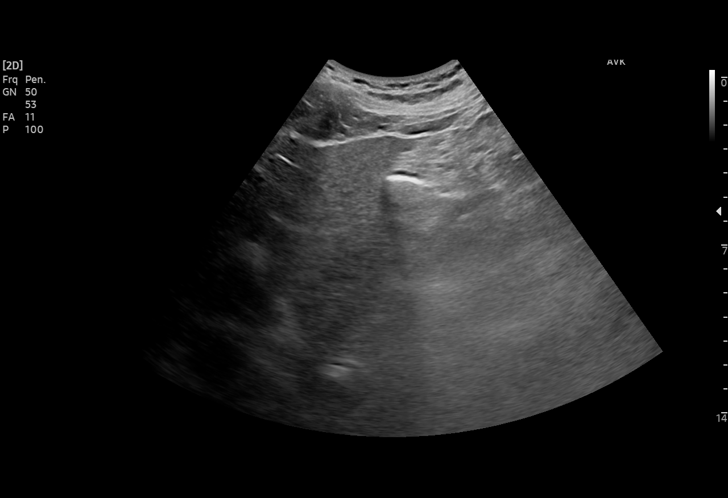
[im 28/55]
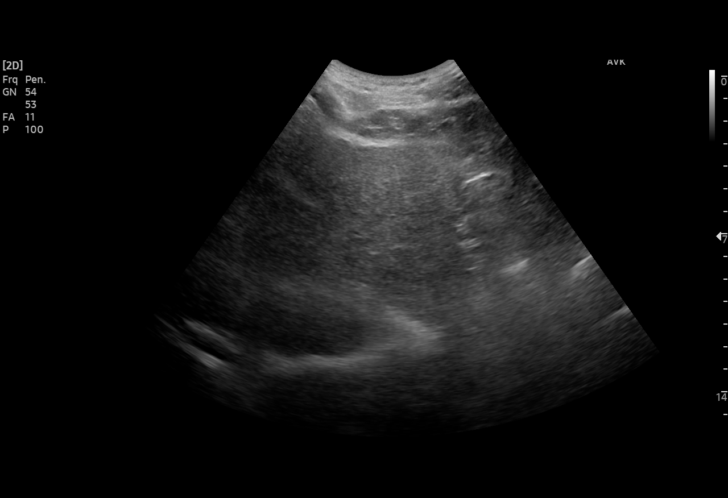
[im 32/55]
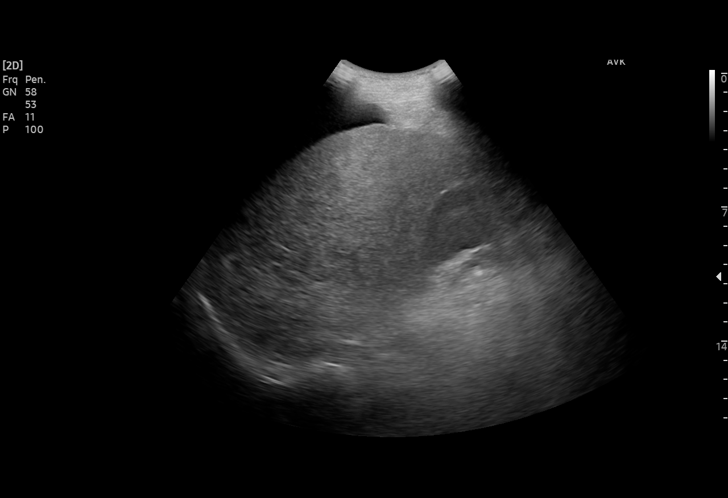
[im 34/55]
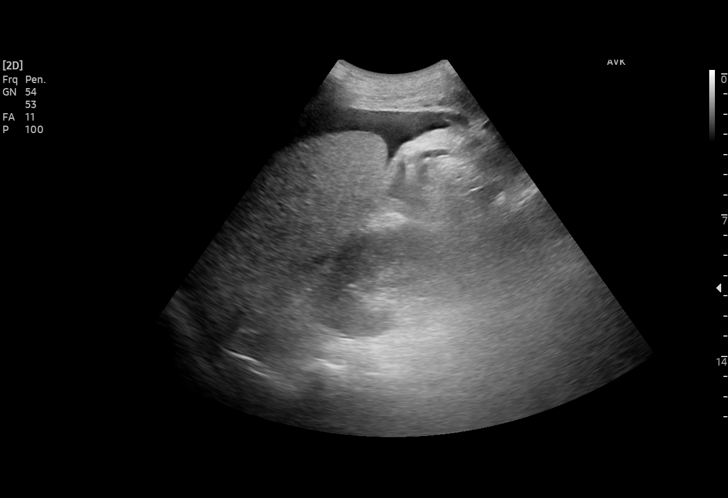
[im 39/55]
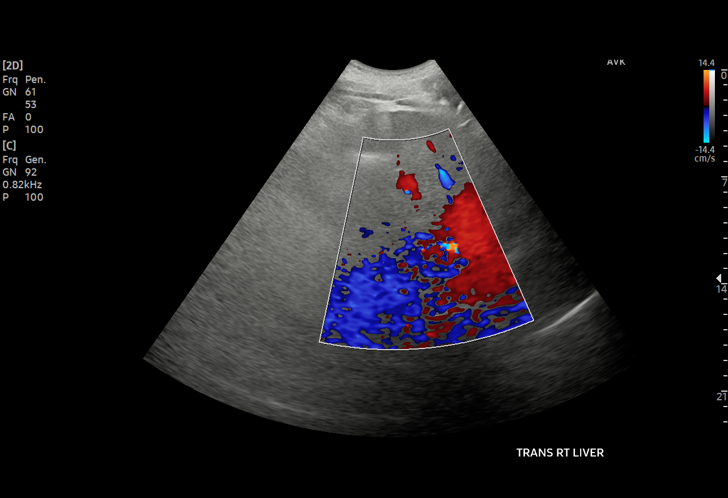
[im 43/55]
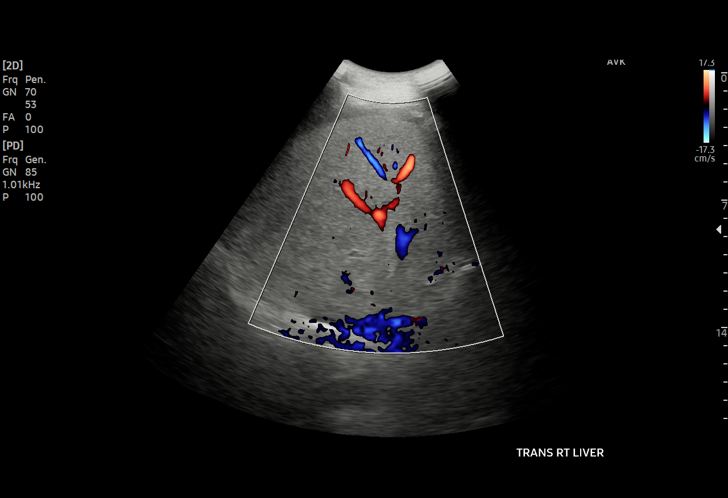
[im 46/55]
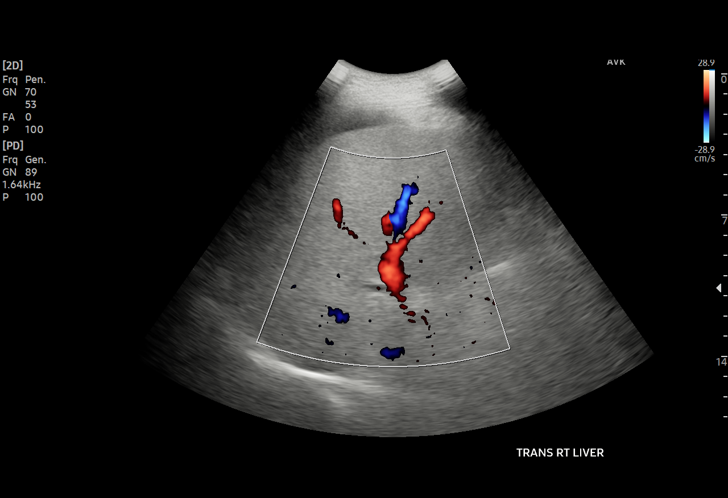
[im 50/55]
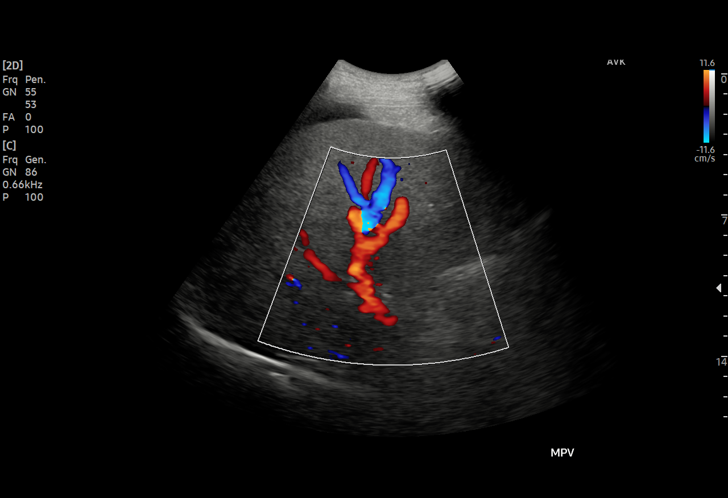
[im 55/55]
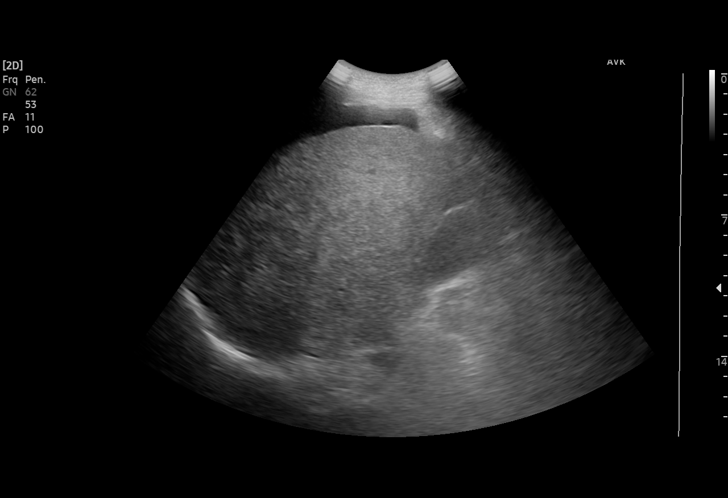

[15 of 25 positions shown; findings below may reference images not displayed]

FINDINGS: Gallbladder:

No gallstones or wall thickening visualized. No sonographic Murphy
sign noted by sonographer.

Common bile duct:

Diameter: 4 mm.

Liver:

No focal lesion identified. Diffusely increased hepatic parenchymal
echogenicity. Portal vein is patent on color Doppler imaging with
normal direction of blood flow towards the liver.

Other: Small volume ascites.
IMPRESSION: 1. The echogenicity of the liver is increased. This is a nonspecific
finding but is most commonly seen with fatty infiltration of the
liver. There are no obvious focal liver lesions.
2. Small volume ascites.

## 2020-01-13 MED ORDER — LACTATED RINGERS IV BOLUS
1000.0000 mL | Freq: Once | INTRAVENOUS | Status: AC
  Administered 2020-01-13: 1000 mL via INTRAVENOUS

## 2020-01-13 MED ORDER — LACTATED RINGERS IV SOLN: INTRAVENOUS | Status: AC

## 2020-01-13 MED ORDER — LACTATED RINGERS IV SOLN: INTRAVENOUS | Status: DC

## 2020-01-13 NOTE — Plan of Care (Signed)
°  Problem: Education: Goal: Ability to describe self-care measures that may prevent or decrease complications (Diabetes Survival Skills Education) will improve Outcome: Progressing Goal: Individualized Educational Video(s) Outcome: Progressing   Problem: Coping: Goal: Ability to adjust to condition or change in health will improve Outcome: Progressing   Problem: Fluid Volume: Goal: Ability to maintain a balanced intake and output will improve Outcome: Progressing   Problem: Health Behavior/Discharge Planning: Goal: Ability to identify and utilize available resources and services will improve Outcome: Progressing Goal: Ability to manage health-related needs will improve Outcome: Progressing   Problem: Metabolic: Goal: Ability to maintain appropriate glucose levels will improve Outcome: Progressing   Problem: Nutritional: Goal: Maintenance of adequate nutrition will improve Outcome: Progressing Goal: Progress toward achieving an optimal weight will improve Outcome: Progressing   Problem: Skin Integrity: Goal: Risk for impaired skin integrity will decrease Outcome: Progressing   Problem: Tissue Perfusion: Goal: Adequacy of tissue perfusion will improve Outcome: Progressing   Problem: Education: Goal: Knowledge of General Education information will improve Description: Including pain rating scale, medication(s)/side effects and non-pharmacologic comfort measures Outcome: Progressing   Problem: Health Behavior/Discharge Planning: Goal: Ability to manage health-related needs will improve Outcome: Progressing   Problem: Clinical Measurements: Goal: Ability to maintain clinical measurements within normal limits will improve Outcome: Progressing Goal: Will remain free from infection Outcome: Progressing Goal: Diagnostic test results will improve Outcome: Progressing Goal: Respiratory complications will improve Outcome: Progressing Goal: Cardiovascular complication will  be avoided Outcome: Progressing   Problem: Activity: Goal: Risk for activity intolerance will decrease Outcome: Progressing   Problem: Nutrition: Goal: Adequate nutrition will be maintained Outcome: Progressing   Problem: Coping: Goal: Level of anxiety will decrease Outcome: Progressing   Problem: Elimination: Goal: Will not experience complications related to bowel motility Outcome: Progressing Goal: Will not experience complications related to urinary retention Outcome: Progressing   Problem: Pain Managment: Goal: General experience of comfort will improve Outcome: Progressing   Problem: Safety: Goal: Ability to remain free from injury will improve Outcome: Progressing   Problem: Skin Integrity: Goal: Risk for impaired skin integrity will decrease Outcome: Progressing   Problem: Education: Goal: Knowledge of Pancreatitis treatment and prevention will improve Outcome: Progressing   Problem: Health Behavior/Discharge Planning: Goal: Ability to formulate a plan to maintain an alcohol-free life will improve Outcome: Progressing   Problem: Nutritional: Goal: Ability to achieve adequate nutritional intake will improve Outcome: Progressing   Problem: Clinical Measurements: Goal: Complications related to the disease process, condition or treatment will be avoided or minimized Outcome: Progressing

## 2020-01-13 NOTE — Progress Notes (Addendum)
PROGRESS NOTE    Cody Cowan  [id number] DOB: 03-Jan-1966 DOA: 01/12/2020 PCP: Patient, No Pcp Per   Chief Complaint  Patient presents with  . Abdominal Pain   Brief Narrative:  Cody Cowan is Cody Cowan 54 y.o. male with medical history significant of diabetes who has not been on any medication, recent alcohol abuse who presents to the ER with persistent epigastric pain rated as 10 out of 10 on arrival with intractable nausea vomiting lasting 2 days.  Patient reports binge drinking recently.  No prior abdominal problems.  No known liver or pancreatic disease.  He was found to be in acute pancreatitis with pseudohyponatremia and elevated glucose.  Denied any family history of gallstones or gallbladder disease.  Patient additionally suspected to have had possible gallstones.  He recently moved over from [place] and has not established care with any physician.  He is being admitted with most likely alcoholic pancreatitis.  He denied fever or chills.  Denied any exposure to Covid..  ED Course: Temperature is 99 blood pressure 162/89 initially pulse 128 respirate of 22 oxygen sat 98% room air.  Sodium 132 potassium 3.8 chloride 93 CO2 21 glucose 311 BUN 23 creatinine 1.76.  His anion gap of 18 with calcium 8.1.  CBC is entirely within normal.  COVID-19 screen is negative alcohol level of 96.  CT abdomen pelvis shows findings consistent with acute pancreatitis.  There is peripancreatic mesenteric fat stranding and free fluid throughout the upper abdomen no abscess.  There is also mild proximal jejunal thickening and dilatation consistent with localized ileus with diffuse hepatic steatosis.  Patient being admitted with acute pancreatitis most likely alcoholic.  Review of Systems: As per HPI otherwise 10 point review of systems negative.   Assessment & Plan:   Principal Problem:   Pancreatitis, acute Active Problems:   Alcohol abuse with intoxication (HCC)   Hyperglycemia    Hyponatremia   # Alcoholic Pancreatitis:  RUQ US without gallstones.  Hx binge drinking this wknd prior to symptoms.  No home meds.  Check triglycerides. Continue IVF - bolus and increase rate in setting of AKI, increased hematocrit compared to yesterday - follow volume status closely Strict I/O  NPO Analgesia, antiemetics prn Will continue to monitor, reimage prn  # Acute Kidney Injury:  Creatinine 0.69 yesterday, increased to 1.24 today Follow IVF, follow UA  # Elevated LFTs: 2/2 etoh.  US as above.  Increased echogenictiy of liver.  Small volume ascities.  Suspect 2/2 etoh.  Follow acute hepatitis panel.  #2 uncontrolled diabetes with hyperglycemia:  Continue SSI for now A1c 11.2 Needs diabetes education when able  #3 alcohol abuse with intoxication: Initiate CIWA protocol  #4 pseudohyponatremia: Most likely due to elevated glucose.  Continue close monitoring.  #5 NAGMA: likely 2/2 AKI, continue to monitor  DVT prophylaxis: lovenox Code Status: full  Family Communication: nephew at bedside Disposition:   Status is: Inpatient  Remains inpatient appropriate because:Inpatient level of care appropriate due to severity of illness   Dispo: The patient is from: Home              Anticipated d/c is to: Home              Anticipated d/c date is: > 3 days              Patient currently is not medically stable to d/c.  Consultants:   none  Procedures:   none  Antimicrobials:  Anti-infectives (From admission, onward)  None     Subjective: C/o abdominal pain Drank 12 pack on sat, abdominal pain started Saturday Drank 1 beer yesterday Usually drinks 3 days Cody Housand week Interpreter used  Objective: Vitals:   01/13/20 1100 01/13/20 1200 01/13/20 1212 01/13/20 1216  BP: (!) 123/92 117/68 117/68 117/68  Pulse:  (!) 121  (!) 119  Resp: 14 17  18   Temp:    98.4 F (36.9 C)  TempSrc:      SpO2:  99%  98%  Weight:      Height:        Intake/Output Summary (Last  24 hours) at 01/13/2020 1224 Last data filed at 01/12/2020 2100 Gross per 24 hour  Intake 2000 ml  Output --  Net 2000 ml   Filed Weights   01/12/20 1443  Weight: 67.1 kg    Examination:  General exam: Appears calm and comfortable  Respiratory system: Clear to auscultation. Respiratory effort normal. Cardiovascular system: S1 & S2 heard, RRR.  Gastrointestinal system: Abdomen is mildly distended, diffuse TTP most in epigastric region, no rebound/guarding Central nervous system: Alert and oriented. No focal neurological deficits. Extremities: no LEE Skin: No rashes, lesions or ulcers Psychiatry: Judgement and insight appear normal. Mood & affect appropriate.     Data Reviewed: I have personally reviewed following labs and imaging studies  CBC: Recent Labs  Lab 01/12/20 1446 01/12/20 2112 01/13/20 0632  WBC 5.9 8.1 8.0  HGB 13.9 13.6 13.6  HCT 38.2* 37.7* 38.6*  MCV 93.9 95.0 95.3  PLT 204 173 153    Basic Metabolic Panel: Recent Labs  Lab 01/12/20 1446 01/12/20 1652 01/12/20 2112 01/13/20 0632  NA 131* 132* 134* 134*  K 4.0 3.8 3.8 4.0  CL 97* 93* 101 103  CO2 15* 21* 19* 20*  GLUCOSE 341* 311* 218* 130*  BUN 22* 23* 21* 28*  CREATININE 0.77 0.76 0.69 1.24  CALCIUM 8.3* 8.1* 7.1* 7.2*  MG  --   --  1.9  --   PHOS  --   --  3.0  --     GFR: Estimated Creatinine Clearance: 59.8 mL/min (by C-G formula based on SCr of 1.24 mg/dL).  Liver Function Tests: Recent Labs  Lab 01/12/20 1446 01/12/20 1652 01/12/20 2112 01/13/20 0632  AST 72* 82* 72* 68*  ALT 52* 56* 45* 42  ALKPHOS 156* 160* 127* 102  BILITOT 1.6* 1.8* 1.7* 2.3*  PROT 6.4* 6.8 5.3* 5.3*  ALBUMIN 3.3* 3.3* 2.6* 2.6*    CBG: Recent Labs  Lab 01/12/20 2309 01/13/20 0801  GLUCAP 206* 154*     Recent Results (from the past 240 hour(s))  Respiratory Panel by RT PCR (Flu Jolynda Townley&B, Covid) - Nasopharyngeal Swab     Status: None   Collection Time: 01/12/20  6:21 PM   Specimen: Nasopharyngeal  Swab  Result Value Ref Range Status   SARS Coronavirus 2 by RT PCR NEGATIVE NEGATIVE Final    Comment: (NOTE) SARS-CoV-2 target nucleic acids are NOT DETECTED.  The SARS-CoV-2 RNA is generally detectable in upper respiratoy specimens during the acute phase of infection. The lowest concentration of SARS-CoV-2 viral copies this assay can detect is 131 copies/mL. Camia Dipinto negative result does not preclude SARS-Cov-2 infection and should not be used as the sole basis for treatment or other patient management decisions. Bria Sparr negative result may occur with  improper specimen collection/handling, submission of specimen other than nasopharyngeal swab, presence of viral mutation(s) within the areas targeted by this assay, and inadequate number of  viral copies (<131 copies/mL). Zyden Suman negative result must be combined with clinical observations, patient history, and epidemiological information. The expected result is Negative.  Fact Sheet for Patients:  https://www.moore.com/  Fact Sheet for Healthcare Providers:  https://www.young.biz/  This test is no t yet approved or cleared by the [place] FDA and  has been authorized for detection and/or diagnosis of SARS-CoV-2 by FDA under an Emergency Use Authorization (EUA). This EUA will remain  in effect (meaning this test can be used) for the duration of the COVID-19 declaration under Section 564(b)(1) of the Act, 21 U.S.C. section 360bbb-3(b)(1), unless the authorization is terminated or revoked sooner.     Influenza Sherryn Pollino by PCR NEGATIVE NEGATIVE Final   Influenza B by PCR NEGATIVE NEGATIVE Final    Comment: (NOTE) The Xpert Xpress SARS-CoV-2/FLU/RSV assay is intended as an aid in  the diagnosis of influenza from Nasopharyngeal swab specimens and  should not be used as Keyan Folson sole basis for treatment. Nasal washings and  aspirates are unacceptable for Xpert Xpress SARS-CoV-2/FLU/RSV  testing.  Fact Sheet for  Patients: https://www.moore.com/  Fact Sheet for Healthcare Providers: https://www.young.biz/  This test is not yet approved or cleared by the [place] FDA and  has been authorized for detection and/or diagnosis of SARS-CoV-2 by  FDA under an Emergency Use Authorization (EUA). This EUA will remain  in effect (meaning this test can be used) for the duration of the  Covid-19 declaration under Section 564(b)(1) of the Act, 21  U.S.C. section 360bbb-3(b)(1), unless the authorization is  terminated or revoked. Performed at [facility], [address] [place], [place] [address]          Radiology Studies: CT ABDOMEN PELVIS W CONTRAST  Result Date: 01/12/2020 CLINICAL DATA:  Diffuse abdominal pain, nausea and vomiting EXAM: CT ABDOMEN AND PELVIS WITH CONTRAST TECHNIQUE: Multidetector CT imaging of the abdomen and pelvis was performed using the standard protocol following bolus administration of intravenous contrast. CONTRAST:  OMNIPAQUE IOHEXOL 300 MG/ML  SOLN COMPARISON:  None. FINDINGS: Lower chest: No acute pleural or parenchymal lung disease. Hepatobiliary: Diffuse hepatic steatosis. No focal liver abnormality. The gallbladder is unremarkable. Pancreas: There are marked inflammatory changes surrounding the pancreas consistent with acute pancreatitis. The pancreatic parenchyma enhances normally. No pancreatic duct dilation. Spleen: Normal in size without focal abnormality. 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 The right adrenal is unremarkable. Kidneys enhance normally and symmetrically. No urinary tract calculi or obstruction. The bladder is unremarkable. Stomach/Bowel: Mildly dilated loops of proximal small bowel, with associated mild wall thickening, likely reflect localized ileus given the adjacent inflammatory changes from pancreatitis. No high-grade obstruction.  Normal appendix right lower quadrant. Vascular/Lymphatic: Aortic atherosclerosis. No enlarged abdominal or pelvic lymph nodes. Reproductive: Prostate is unremarkable. Other: Moderate ascites is seen within the upper abdomen, related to the acute pancreatitis described above. No fluid collection or abscess. No free intraperitoneal gas. No abdominal wall hernia. Musculoskeletal: No acute or destructive bony lesions. Reconstructed images demonstrate no additional findings. IMPRESSION: 1. Findings consistent with acute pancreatitis. There is peripancreatic mesenteric fat stranding and free fluid throughout the upper abdomen. No localized fluid collection, abscess, or pseudocyst. 2. Mild proximal jejunal wall thickening and dilation consistent with localized ileus. 3. Diffuse hepatic steatosis. 4.  Aortic Atherosclerosis (ICD10-I70.0). Electronically Signed   By: Sharlet Salina M.D.   On: 01/12/2020 18:38   US Abdomen Limited RUQ (LIVER/GB)  Result Date: 01/13/2020 CLINICAL DATA:  Pancreatitis EXAM:  ULTRASOUND ABDOMEN LIMITED RIGHT UPPER QUADRANT COMPARISON:  CT 01/12/2020 FINDINGS: Gallbladder: No gallstones or wall thickening visualized. No sonographic Murphy sign noted by sonographer. Common bile duct: Diameter: 4 mm. Liver: No focal lesion identified. Diffusely increased hepatic parenchymal echogenicity. Portal vein is patent on color Doppler imaging with normal direction of blood flow towards the liver. Other: Small volume ascites. IMPRESSION: 1. The echogenicity of the liver is increased. This is Siah Steely nonspecific finding but is most commonly seen with fatty infiltration of the liver. There are no obvious focal liver lesions. 2. Small volume ascites. Electronically Signed   By: Duanne Guess D.O.   On: 01/13/2020 09:18        Scheduled Meds: . enoxaparin (LOVENOX) injection  40 mg Subcutaneous Q24H  . folic acid  1 mg Oral Daily  . insulin aspart  0-5 Units Subcutaneous QHS  . insulin aspart  0-9  Units Subcutaneous TID WC  . multivitamin with minerals  1 tablet Oral Daily  . thiamine  100 mg Oral Daily   Or  . thiamine  100 mg Intravenous Daily   Continuous Infusions: . lactated ringers 200 mL/hr at 01/13/20 0901   Followed by  . lactated ringers       LOS: 1 day    Time spent: over 30 min    Lacretia Nicks, MD [facility]   To contact the attending provider between 7A-7P or the covering provider during after hours 7P-7A, please log into the web site [email or web address] and access using universal [place] password for that web site. If you do not have the password, please call the hospital operator.  01/13/2020, 12:24 PM

## 2020-01-13 NOTE — ED Notes (Signed)
Attempted 5 am lab draw without success.  Lab called to come draw.

## 2020-01-13 NOTE — ED Notes (Signed)
Will call accepting nurse back; she is unable to take report for a few minutes.

## 2020-01-13 NOTE — ED Notes (Signed)
Pt is sleeping but wakes as I enter room.  Pt states that pain is reduced and only a little bit of pain can be felt.  Pressure soft but pt is asymptomatic.

## 2020-01-13 NOTE — ED Notes (Signed)
Cliffton Asters, NP notified regarding patient's heart rate.

## 2020-01-14 ENCOUNTER — Inpatient Hospital Stay: Payer: Self-pay

## 2020-01-14 LAB — URINALYSIS, COMPLETE (UACMP) WITH MICROSCOPIC
Bacteria, UA: NONE SEEN
Bilirubin Urine: NEGATIVE
Glucose, UA: >=500 mg/dL — AB
Glucose, UA: >=500 mg/dL — AB
Hgb urine dipstick: NEGATIVE
Ketones, ur: NEGATIVE mg/dL
Ketones, ur: NEGATIVE mg/dL
Leukocytes,Ua: NEGATIVE
Nitrite: NEGATIVE
Nitrite: NEGATIVE
Protein, ur: NEGATIVE mg/dL
Protein, ur: NEGATIVE mg/dL
RBC / HPF: >50 RBC/hpf — ABNORMAL HIGH (ref 0–5)
Specific Gravity, Urine: 1.003 — ABNORMAL LOW (ref 1.005–1.030)
Specific Gravity, Urine: 1.003 — ABNORMAL LOW (ref 1.005–1.030)
Squamous Epithelial / HPF: NONE SEEN (ref 0–5)
WBC, UA: >50 WBC/hpf — ABNORMAL HIGH (ref 0–5)
pH: 6 (ref 5.0–8.0)
pH: 6 (ref 5.0–8.0)

## 2020-01-14 LAB — CBC WITH DIFFERENTIAL/PLATELET
Abs Immature Granulocytes: 0.04 10*3/uL (ref 0.00–0.07)
Basophils Absolute: 0 10*3/uL (ref 0.0–0.1)
Basophils Relative: 0 %
Eosinophils Absolute: 0.1 10*3/uL (ref 0.0–0.5)
Eosinophils Relative: 1 %
HCT: 30.7 % — ABNORMAL LOW (ref 39.0–52.0)
Hemoglobin: 10.9 g/dL — ABNORMAL LOW (ref 13.0–17.0)
Immature Granulocytes: 1 %
Lymphocytes Relative: 23 %
Lymphs Abs: 1.2 10*3/uL (ref 0.7–4.0)
MCH: 34 pg (ref 26.0–34.0)
MCHC: 35.5 g/dL (ref 30.0–36.0)
MCV: 95.6 fL (ref 80.0–100.0)
Monocytes Absolute: 0.4 10*3/uL (ref 0.1–1.0)
Monocytes Relative: 7 %
Neutro Abs: 3.6 10*3/uL (ref 1.7–7.7)
Neutrophils Relative %: 68 %
Platelets: 124 10*3/uL — ABNORMAL LOW (ref 150–400)
RBC: 3.21 MIL/uL — ABNORMAL LOW (ref 4.22–5.81)
RDW: 13.1 % (ref 11.5–15.5)
Smear Review: NORMAL
WBC: 5.4 10*3/uL (ref 4.0–10.5)
nRBC: 0 % (ref 0.0–0.2)

## 2020-01-14 LAB — PHOSPHORUS: Phosphorus: 1.4 mg/dL — ABNORMAL LOW (ref 2.5–4.6)

## 2020-01-14 LAB — COMPREHENSIVE METABOLIC PANEL
ALT: 31 U/L (ref 0–44)
AST: 65 U/L — ABNORMAL HIGH (ref 15–41)
Albumin: 2.3 g/dL — ABNORMAL LOW (ref 3.5–5.0)
Alkaline Phosphatase: 89 U/L (ref 38–126)
Anion gap: 8 (ref 5–15)
BUN: 19 mg/dL (ref 6–20)
CO2: 19 mmol/L — ABNORMAL LOW (ref 22–32)
Calcium: 7.1 mg/dL — ABNORMAL LOW (ref 8.9–10.3)
Chloride: 105 mmol/L (ref 98–111)
Creatinine, Ser: 0.57 mg/dL — ABNORMAL LOW (ref 0.61–1.24)
GFR, Estimated: >60 mL/min (ref >60)
Glucose, Bld: 126 mg/dL — ABNORMAL HIGH (ref 70–99)
Potassium: 3.5 mmol/L (ref 3.5–5.1)
Sodium: 132 mmol/L — ABNORMAL LOW (ref 135–145)
Total Bilirubin: 2.6 mg/dL — ABNORMAL HIGH (ref 0.3–1.2)
Total Protein: 4.6 g/dL — ABNORMAL LOW (ref 6.5–8.1)

## 2020-01-14 LAB — GLUCOSE, CAPILLARY
Glucose-Capillary: 127 mg/dL — ABNORMAL HIGH (ref 70–99)
Glucose-Capillary: 183 mg/dL — ABNORMAL HIGH (ref 70–99)
Glucose-Capillary: 85 mg/dL (ref 70–99)
Glucose-Capillary: 168 mg/dL — ABNORMAL HIGH (ref 70–99)

## 2020-01-14 LAB — MAGNESIUM: Magnesium: 1.9 mg/dL (ref 1.7–2.4)

## 2020-01-14 LAB — LIPASE, BLOOD: Lipase: 107 U/L — ABNORMAL HIGH (ref 11–51)

## 2020-01-14 IMAGING — DX DG CHEST 1V PORT
1 series · 1 of 1 positions shown · non-contrast
Comparison: None similar

CLINICAL DATA: Pancreatitis

EXAM:
PORTABLE CHEST 1 VIEW

[chest ap]
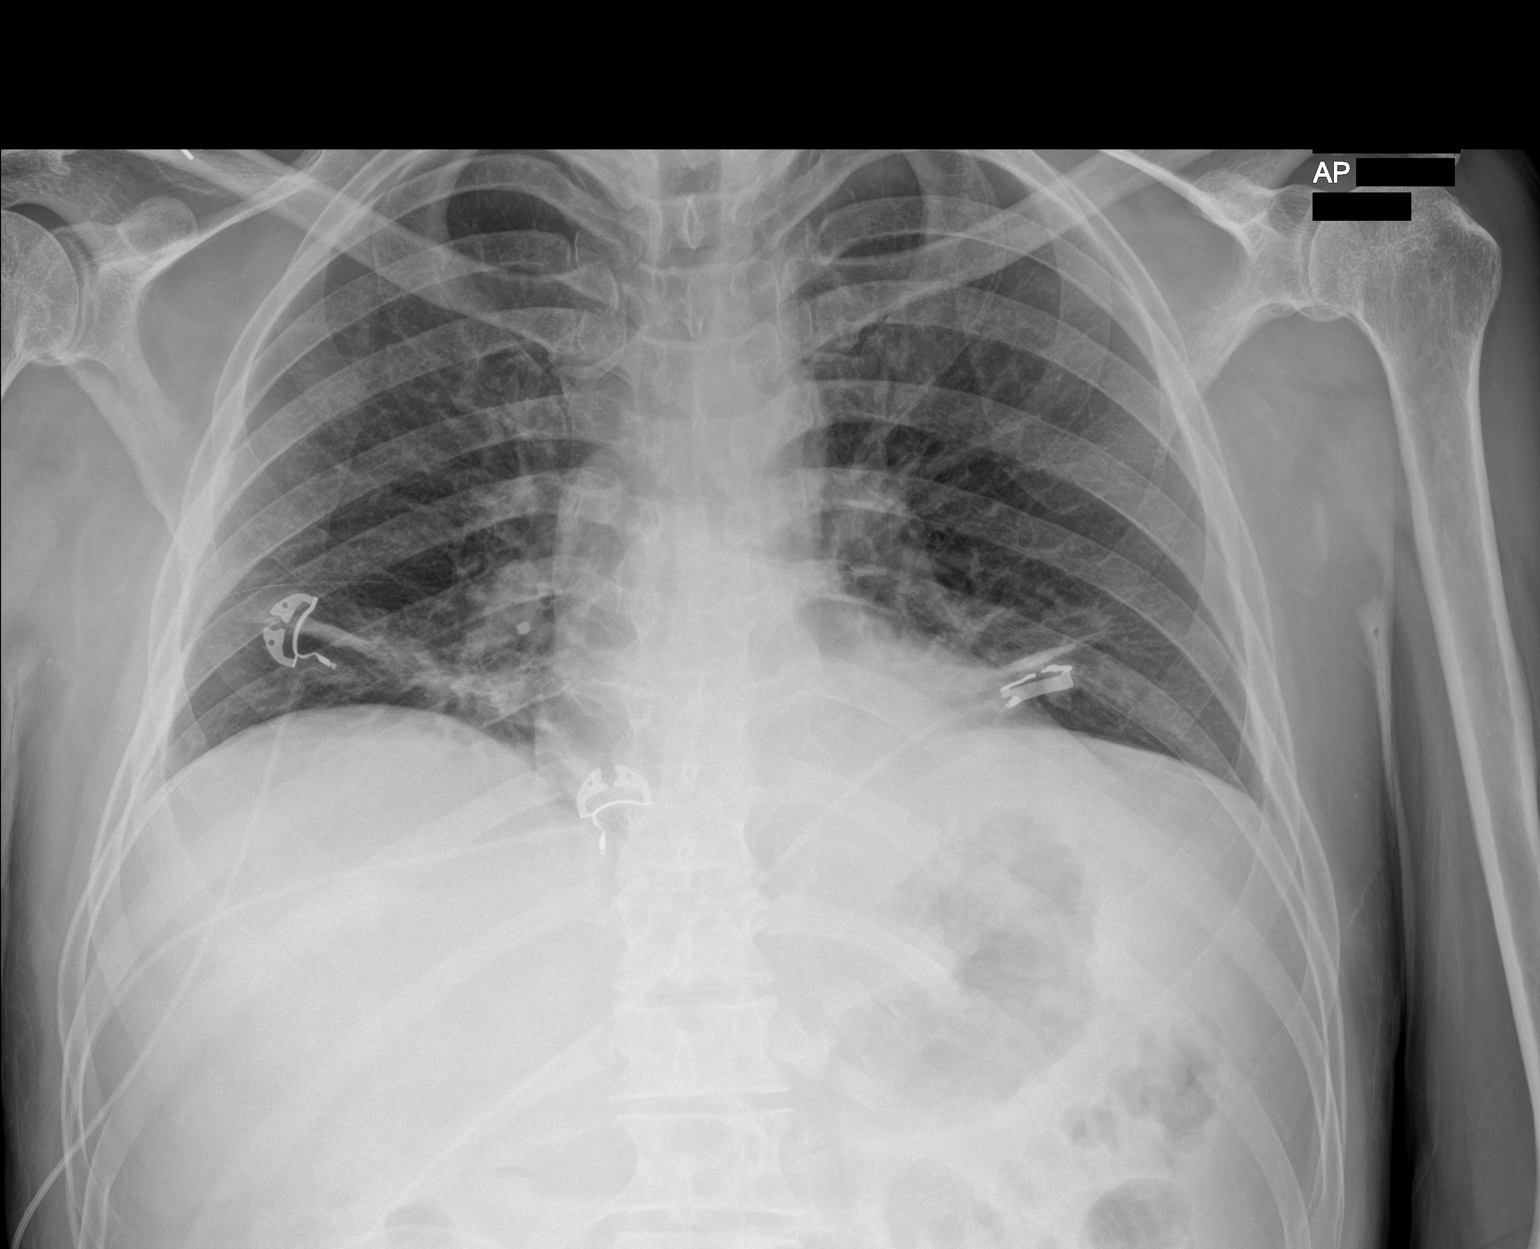

[1 of 1 positions shown; findings below may reference images not displayed]

FINDINGS: Mildly low lung volumes with streaky density at the bases. Normal
heart size. No edema, air bronchogram, effusion, or pneumothorax.
IMPRESSION: Low volume chest with atelectasis at the bases.

## 2020-01-14 MED ORDER — LACTATED RINGERS IV SOLN
INTRAVENOUS | Status: DC
  Filled 2020-01-14: qty 1000

## 2020-01-14 MED ORDER — PROSOURCE PLUS PO LIQD
30.0000 mL | Freq: Three times a day (TID) | ORAL | Status: DC
  Administered 2020-01-14 – 2020-01-16 (×4): 30 mL via ORAL
  Filled 2020-01-14 (×7): qty 30

## 2020-01-14 MED ORDER — K PHOS MONO-SOD PHOS DI & MONO 155-852-130 MG PO TABS
500.0000 mg | ORAL_TABLET | Freq: Four times a day (QID) | ORAL | Status: AC
  Administered 2020-01-14 (×4): 500 mg via ORAL
  Filled 2020-01-14 (×4): qty 2

## 2020-01-14 MED ORDER — INSULIN DETEMIR 100 UNIT/ML ~~LOC~~ SOLN
5.0000 [IU] | Freq: Two times a day (BID) | SUBCUTANEOUS | Status: DC
  Administered 2020-01-14: 5 [IU] via SUBCUTANEOUS
  Filled 2020-01-14 (×3): qty 0.05

## 2020-01-14 MED ORDER — INSULIN DETEMIR 100 UNIT/ML ~~LOC~~ SOLN
5.0000 [IU] | Freq: Every day | SUBCUTANEOUS | Status: DC
  Administered 2020-01-15 – 2020-01-16 (×2): 5 [IU] via SUBCUTANEOUS
  Filled 2020-01-14 (×3): qty 0.05

## 2020-01-14 MED ORDER — POTASSIUM CHLORIDE CRYS ER 20 MEQ PO TBCR
40.0000 meq | EXTENDED_RELEASE_TABLET | Freq: Once | ORAL | Status: AC
  Administered 2020-01-14: 40 meq via ORAL
  Filled 2020-01-14: qty 2

## 2020-01-14 MED ORDER — POTASSIUM CHLORIDE 2 MEQ/ML IV SOLN
INTRAVENOUS | Status: DC
  Filled 2020-01-14 (×3): qty 1000

## 2020-01-14 NOTE — TOC Initial Note (Signed)
Transition of Care [facility]) - Initial/Assessment Note    Patient Details  Name: Cody Cowan MRN: [id number] Date of Birth: 1965-11-17  Transition of Care [facility]) CM/SW Contact:    Marina Goodell Phone Number: [phone] 01/14/2020, 9:37 AM  Clinical Narrative:                  CSW spoke with patient and explained the role of TOC in patient care. Patient stated he lives with his sister in [place], Jeffie Pollock, but could not remember her phone number.  Patient stated he is able to perform all his ADLs, but he does not drive.  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  Patient stated he does drink alcohol but only drinks 12oz beer everyday.  CSW asked what brought him to the ED and the patient stated that he felt ill after drinking a beer. CSW spoke with the patient about substance abuse resources, gave him a list of resources, and the application spanish version of the [employer] application.  Expected Discharge Plan: Home w Home Health Services Barriers to Discharge: Continued Medical Work up, Inadequate or no insurance   Patient Goals and CMS Choice Patient states their goals for this hospitalization and ongoing recovery are:: To feel better and return home.      Expected Discharge Plan and Services Expected Discharge Plan: Home w Home Health Services In-house Referral: Clinical Social Work     Living arrangements for the past 2 months: Single Family Home                                      Prior Living Arrangements/Services Living arrangements for the past 2 months: Single Family Home Lives with:: Relatives (lives with sister Jeffie Pollock but could not remember contact number.) Patient language and need for interpreter reviewed:: Yes (Needs Spanish interpreter)        Need for Family Participation in Patient Care: Yes (Comment) Care giver support system in place?: No  (comment)   Criminal Activity/Legal Involvement Pertinent to Current Situation/Hospitalization: No - Comment as needed  Activities of Daily Living Home Assistive Devices/Equipment: None ADL Screening (condition at time of admission) Patient's cognitive ability adequate to safely complete daily activities?: Yes Is the patient deaf or have difficulty hearing?: No Does the patient have difficulty seeing, even when wearing glasses/contacts?: No Does the patient have difficulty concentrating, remembering, or making decisions?: No Patient able to express need for assistance with ADLs?: Yes Does the patient have difficulty dressing or bathing?: No Independently performs ADLs?: Yes (appropriate for developmental age) Does the patient have difficulty walking or climbing stairs?: No Weakness of Legs: None Weakness of Arms/Hands: None  Permission Sought/Granted Permission sought to share information with : Family Supports (sister Jeffie Pollock pt cannot remember her phone number.)    Share Information with NAME: Jeffie Pollock sister           Emotional Assessment Appearance:: Appears older than stated age Attitude/Demeanor/Rapport: Engaged Affect (typically observed): Guarded Orientation: : Oriented to Self, Oriented to Place, Oriented to Situation Alcohol / Substance Use: Alcohol Use (Patient stated he drinks beer daily) Psych Involvement: No (comment)  Admission diagnosis:  Pancreatitis, acute [K85.90] Pancreatitis [K85.90] Hyperglycemia [R73.9] Alcohol-induced acute pancreatitis, unspecified complication status [K85.20] Patient Active Problem List   Diagnosis Date Noted  . Alcohol  abuse with intoxication (HCC) 01/12/2020  . Hyperglycemia 01/12/2020  . Hyponatremia 01/12/2020  . Pancreatitis, acute 01/12/2020   PCP:  Patient, No Pcp Per Pharmacy:  No Pharmacies Listed    Social Determinants of Health (SDOH) Interventions    Readmission Risk Interventions No flowsheet data  found.

## 2020-01-14 NOTE — Progress Notes (Signed)
Initial Nutrition Assessment  DOCUMENTATION CODES:   Non-severe (moderate) malnutrition in context of social or environmental circumstances  INTERVENTION:  Provide PROSource Plus 30 mL po TID between meals, each supplement provides 100 kcal and 15 grams of protein.  Once diet is advanced past clear liquids provide Ensure Max Protein po BID, each supplement provides 150 kcal and 30 grams of protein.  Encouraged adequate intake of calories and protein at meals. Discussed which foods contain protein and how patient can increase protein intake at home.  Monitor magnesium, potassium, and phosphorus daily for at least 3 days, MD to replete as needed, as pt is at risk for refeeding syndrome given malnutrition, EtOH abuse. MD is already monitoring and replacing electrolytes.  NUTRITION DIAGNOSIS:   Moderate Malnutrition related to social / environmental circumstances (EtOH abuse, suspected inadequate oral intake) as evidenced by moderate fat depletion, moderate muscle depletion, severe muscle depletion.  GOAL:   Patient will meet greater than or equal to 90% of their needs  MONITOR:   PO intake, Supplement acceptance, Diet advancement, Labs, Weight trends, I & O's  REASON FOR ASSESSMENT:   Malnutrition Screening Tool    ASSESSMENT:   54 year old male with PMHx of DM, EtOH abuse admitted with alcoholic pancreatitis, AKI.   Met with patient at bedside with assistance from [place] interpreter Environmental education officer). Patient initially reports he has had a decreased appetite but then later reported he is hungry but cannot eat well lately. He reports he tries to eat 2 meals per day. At home he usually eats beans and rice. He is currently on clear liquid diet. He reports he is tolerating well. He denies any N/V or abdominal pain. He does endorse diarrhea that began today. Discussed importance of adequate calorie/protein intake. Patient is amenable to drinking oral nutrition supplements to help meet  needs.  Patient reports his UBW was 198 lbs and he has lost down to 148 lbs over the last 2 years. He is unsure what led to this weight loss. No weight history in chart to trend.  Medications reviewed and include: folic acid 1 mg daily, Novolog 0-5 units QHS, Novolog 0-9 units TID, Levemir 5 units BID, MVI daily, K Phos Neutral 500 mg QID, thiamine 100 mg daily, LR at 125 mL/hr.  Labs reviewed: CBG 85-183, Sodium 132, CO2 19, Creatinine 0.57, Phosphorus 1.4.  NUTRITION - FOCUSED PHYSICAL EXAM:    Most Recent Value  Orbital Region Moderate depletion  Upper Arm Region Moderate depletion  Thoracic and Lumbar Region Moderate depletion  Buccal Region Moderate depletion  Temple Region Moderate depletion  Clavicle Bone Region Moderate depletion  Clavicle and Acromion Bone Region Moderate depletion  Scapular Bone Region Mild depletion  Dorsal Hand Moderate depletion  Patellar Region Severe depletion  Anterior Thigh Region Severe depletion  Posterior Calf Region Severe depletion  Edema (RD Assessment) None  Hair Reviewed  Eyes Reviewed  Mouth Reviewed  Skin Reviewed  Nails Reviewed     Diet Order:   Diet Order            Diet clear liquid Room service appropriate? Yes; Fluid consistency: Thin  Diet effective now                EDUCATION NEEDS:   Education needs have been addressed  Skin:  Skin Assessment: Reviewed RN Assessment  Last BM:  01/14/2020 - large type 6  Height:   Ht Readings from Last 1 Encounters:  01/12/20 5' 4.96" (1.65 m)  Weight:   Wt Readings from Last 1 Encounters:  01/12/20 67.1 kg   BMI:  Body mass index is 24.66 kg/m.  Estimated Nutritional Needs:   Kcal:  2000-2200  Protein:  100-110 grams  Fluid:  2 L/day  Jacklynn Barnacle, MS, RD, LDN Pager number available on Amion

## 2020-01-14 NOTE — Progress Notes (Addendum)
PROGRESS NOTE    Cody Cowan  OEV:[id number] DOB: May 16, 1965 DOA: 01/12/2020 PCP: Patient, No Pcp Per   Chief Complaint  Patient presents with  . Abdominal Pain   Brief Narrative:  Cody Cowan is Cody Cowan 54 y.o. male with medical history significant of diabetes with [name] hx of etoh abuse who presents after binge drinking episode on Saturday with alcoholic pancreatitis.    Assessment & Plan:   Principal Problem:   Pancreatitis, acute Active Problems:   Alcohol abuse with intoxication (HCC)   Hyperglycemia   Hyponatremia   # Alcoholic Pancreatitis:  RUQ [place] without gallstones.  Hx binge drinking this wknd prior to symptoms.  No home meds.  Check triglycerides. Continue IVF Attention to volume status, UOP Strict I/O  Clear liquids today, advance to soft as tolerated Analgesia, antiemetics prn Will continue to monitor, reimage prn Hopefully d/c within 24-48 hrs if he continues to improve  # Acute Kidney Injury:  Improved with IVF  # Concern for hematuria: follow UA - concern for bloody urine in cannister   # Hypophosphatemia: replace and follow  # Elevated LFTs: 2/2 etoh.  [place] as above.  Increased echogenictiy of liver.  Small volume ascities.  Suspect 2/2 etoh.  Follow acute hepatitis panel (negative). Improved AST/ALT, bili has bumped  #2 uncontrolled diabetes with hyperglycemia:  Continue SSI for now A1c 11.2 Start basal insulin, follow Needs diabetes education when able  #3 alcohol abuse with intoxication: Initiate CIWA protocol  #4 hyponatremia: mild, continue to monitor with IVF  #5 NAGMA: continue to monitor, possibly related to IVF  # Thrombocytopenia: all counts down today, likely component of dilution.  likely 2/2 etoh use, follow.  DVT prophylaxis: lovenox Code Status: full  Family Communication: nephew at bedside Disposition:   Status is: Inpatient  Remains inpatient appropriate because:Inpatient level of care appropriate  due to severity of illness   Dispo: The patient is from: Home              Anticipated d/c is to: Home              Anticipated d/c date is: > 3 days              Patient currently is not medically stable to d/c.  Consultants:   none  Procedures:   none  Antimicrobials:  Anti-infectives (From admission, onward)   None     Subjective: Asking to eat today abd pain improved Interpreter used  Objective: Vitals:   01/14/20 0048 01/14/20 0422 01/14/20 0723 01/14/20 1133  BP: 131/71 (!) 147/89 (!) 137/91 (!) 163/91  Pulse: (!) 107 (!) 104 96 97  Resp: 18 17 16 18   Temp: 98.8 F (37.1 C) 97.9 F (36.6 C) 98.1 F (36.7 C) 98.4 F (36.9 C)  TempSrc: Oral Oral Oral Oral  SpO2: 96% 98% 99% 99%  Weight:      Height:        Intake/Output Summary (Last 24 hours) at 01/14/2020 1349 Last data filed at 01/14/2020 0320 Gross per 24 hour  Intake 1566.2 ml  Output --  Net 1566.2 ml   Filed Weights   01/12/20 1443  Weight: 67.1 kg    Examination:  General: No acute distress. Cardiovascular: Heart sounds show Deonna Krummel regular rate, and rhythm.  Lungs: Clear to auscultation bilaterally  Abdomen: Soft, mildly tender to palpation, most in epigastric region Neurological: Alert and oriented 3. Moves all extremities 4 . Cranial nerves II through XII grossly intact. Skin:  Warm and dry. No rashes or lesions. Extremities: No clubbing or cyanosis. No edema.  Data Reviewed: I have personally reviewed following labs and imaging studies  CBC: Recent Labs  Lab 01/12/20 1446 01/12/20 2112 01/13/20 0632 01/14/20 0409  WBC 5.9 8.1 8.0 5.4  NEUTROABS  --   --   --  3.6  HGB 13.9 13.6 13.6 10.9*  HCT 38.2* 37.7* 38.6* 30.7*  MCV 93.9 95.0 95.3 95.6  PLT 204 173 153 124*    Basic Metabolic Panel: Recent Labs  Lab 01/12/20 1446 01/12/20 1652 01/12/20 2112 01/13/20 0632 01/14/20 0409  NA 131* 132* 134* 134* 132*  K 4.0 3.8 3.8 4.0 3.5  CL 97* 93* 101 103 105  CO2 15* 21* 19*  20* 19*  GLUCOSE 341* 311* 218* 130* 126*  BUN 22* 23* 21* 28* 19  CREATININE 0.77 0.76 0.69 1.24 0.57*  CALCIUM 8.3* 8.1* 7.1* 7.2* 7.1*  MG  --   --  1.9  --  1.9  PHOS  --   --  3.0  --  1.4*    GFR: Estimated Creatinine Clearance: 92.7 mL/min (Hania Cerone) (by C-G formula based on SCr of 0.57 mg/dL (L)).  Liver Function Tests: Recent Labs  Lab 01/12/20 1446 01/12/20 1652 01/12/20 2112 01/13/20 0632 01/14/20 0409  AST 72* 82* 72* 68* 65*  ALT 52* 56* 45* 42 31  ALKPHOS 156* 160* 127* 102 89  BILITOT 1.6* 1.8* 1.7* 2.3* 2.6*  PROT 6.4* 6.8 5.3* 5.3* 4.6*  ALBUMIN 3.3* 3.3* 2.6* 2.6* 2.3*    CBG: Recent Labs  Lab 01/13/20 1312 01/13/20 1630 01/13/20 2106 01/14/20 0720 01/14/20 1134  GLUCAP 168* 168* 105* 127* 183*     Recent Results (from the past 240 hour(s))  Respiratory Panel by RT PCR (Flu Mennie Spiller&B, Covid) - Nasopharyngeal Swab     Status: None   Collection Time: 01/12/20  6:21 PM   Specimen: Nasopharyngeal Swab  Result Value Ref Range Status   SARS Coronavirus 2 by RT PCR NEGATIVE NEGATIVE Final    Comment: (NOTE) SARS-CoV-2 target nucleic acids are NOT DETECTED.  The SARS-CoV-2 RNA is generally detectable in upper respiratoy specimens during the acute phase of infection. The lowest concentration of SARS-CoV-2 viral copies this assay can detect is 131 copies/mL. Katrina Brosh negative result does not preclude SARS-Cov-2 infection and should not be used as the sole basis for treatment or other patient management decisions. Girard Koontz negative result may occur with  improper specimen collection/handling, submission of specimen other than nasopharyngeal swab, presence of viral mutation(s) within the areas targeted by this assay, and inadequate number of viral copies (<131 copies/mL). Toyna Erisman negative result must be combined with clinical observations, patient history, and epidemiological information. The expected result is Negative.  Fact Sheet for Patients:    https://www.moore.com/https://www.fda.gov/media/142436/download  Fact Sheet for Healthcare Providers:  https://www.young.biz/https://www.fda.gov/media/142435/download  This test is no t yet approved or cleared by the [place] FDA and  has been authorized for detection and/or diagnosis of SARS-CoV-2 by FDA under an Emergency Use Authorization (EUA). This EUA will remain  in effect (meaning this test can be used) for the duration of the COVID-19 declaration under Section 564(b)(1) of the Act, 21 U.S.C. section 360bbb-3(b)(1), unless the authorization is terminated or revoked sooner.     Influenza Sloan Takagi by PCR NEGATIVE NEGATIVE Final   Influenza B by PCR NEGATIVE NEGATIVE Final    Comment: (NOTE) The Xpert Xpress SARS-CoV-2/FLU/RSV assay is intended as an aid in  the diagnosis of  influenza from Nasopharyngeal swab specimens and  should not be used as Randal Goens sole basis for treatment. Nasal washings and  aspirates are unacceptable for Xpert Xpress SARS-CoV-2/FLU/RSV  testing.  Fact Sheet for Patients: https://www.moore.com/  Fact Sheet for Healthcare Providers: https://www.young.biz/  This test is not yet approved or cleared by the [place] FDA and  has been authorized for detection and/or diagnosis of SARS-CoV-2 by  FDA under an Emergency Use Authorization (EUA). This EUA will remain  in effect (meaning this test can be used) for the duration of the  Covid-19 declaration under Section 564(b)(1) of the Act, 21  U.S.C. section 360bbb-3(b)(1), unless the authorization is  terminated or revoked. Performed at [facility], [address] [place], [place] [address]          Radiology Studies: CT ABDOMEN PELVIS W CONTRAST  Result Date: 01/12/2020 CLINICAL DATA:  Diffuse abdominal pain, nausea and vomiting EXAM: CT ABDOMEN AND PELVIS WITH CONTRAST TECHNIQUE: Multidetector CT imaging of the abdomen and pelvis was performed using the standard protocol following bolus  administration of intravenous contrast. CONTRAST:  OMNIPAQUE IOHEXOL 300 MG/ML  SOLN COMPARISON:  None. FINDINGS: Lower chest: No acute pleural or parenchymal lung disease. Hepatobiliary: Diffuse hepatic steatosis. No focal liver abnormality. The gallbladder is unremarkable. Pancreas: There are marked inflammatory changes surrounding the pancreas consistent with acute pancreatitis. The pancreatic parenchyma enhances normally. No pancreatic duct dilation. Spleen: Normal in size without focal abnormality. 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 The right adrenal is unremarkable. Kidneys enhance normally and symmetrically. No urinary tract calculi or obstruction. The bladder is unremarkable. Stomach/Bowel: Mildly dilated loops of proximal small bowel, with associated mild wall thickening, likely reflect localized ileus given the adjacent inflammatory changes from pancreatitis. No high-grade obstruction. Normal appendix right lower quadrant. Vascular/Lymphatic: Aortic atherosclerosis. No enlarged abdominal or pelvic lymph nodes. Reproductive: Prostate is unremarkable. Other: Moderate ascites is seen within the upper abdomen, related to the acute pancreatitis described above. No fluid collection or abscess. No free intraperitoneal gas. No abdominal wall hernia. Musculoskeletal: No acute or destructive bony lesions. Reconstructed images demonstrate no additional findings. IMPRESSION: 1. Findings consistent with acute pancreatitis. There is peripancreatic mesenteric fat stranding and free fluid throughout the upper abdomen. No localized fluid collection, abscess, or pseudocyst. 2. Mild proximal jejunal wall thickening and dilation consistent with localized ileus. 3. Diffuse hepatic steatosis. 4.  Aortic Atherosclerosis (ICD10-I70.0). Electronically Signed   By: Sharlet Salina M.D.   On: 01/12/2020 18:38   DG Chest Port 1 View  Result Date:  01/14/2020 CLINICAL DATA:  Pancreatitis EXAM: PORTABLE CHEST 1 VIEW COMPARISON:  None similar FINDINGS: Mildly low lung volumes with streaky density at the bases. Normal heart size. No edema, air bronchogram, effusion, or pneumothorax. IMPRESSION: Low volume chest with atelectasis at the bases. Electronically Signed   By: Marnee Spring M.D.   On: 01/14/2020 07:52   US Abdomen Limited RUQ (LIVER/GB)  Result Date: 01/13/2020 CLINICAL DATA:  Pancreatitis EXAM: ULTRASOUND ABDOMEN LIMITED RIGHT UPPER QUADRANT COMPARISON:  CT 01/12/2020 FINDINGS: Gallbladder: No gallstones or wall thickening visualized. No sonographic Murphy sign noted by sonographer. Common bile duct: Diameter: 4 mm. Liver: No focal lesion identified. Diffusely increased hepatic parenchymal echogenicity. Portal vein is patent on color Doppler imaging with normal direction of blood flow towards the liver. Other: Small volume ascites. IMPRESSION: 1. The echogenicity of the liver is increased. This is Kathrene Sinopoli nonspecific finding but is most commonly seen with  fatty infiltration of the liver. There are no obvious focal liver lesions. 2. Small volume ascites. Electronically Signed   By: Duanne Guess D.O.   On: 01/13/2020 09:18        Scheduled Meds: . enoxaparin (LOVENOX) injection  40 mg Subcutaneous Q24H  . folic acid  1 mg Oral Daily  . insulin aspart  0-5 Units Subcutaneous QHS  . insulin aspart  0-9 Units Subcutaneous TID WC  . insulin detemir  5 Units Subcutaneous BID  . multivitamin with minerals  1 tablet Oral Daily  . phosphorus  500 mg Oral QID  . thiamine  100 mg Oral Daily   Or  . thiamine  100 mg Intravenous Daily   Continuous Infusions: . lactated ringers 125 mL/hr at 01/14/20 1036     LOS: 2 days    Time spent: over 30 min    Lacretia Nicks, MD [facility]   To contact the attending provider between 7A-7P or the covering provider during after hours 7P-7A, please log into the web site [email or web address]  and access using universal [place] password for that web site. If you do not have the password, please call the hospital operator.  01/14/2020, 1:49 PM

## 2020-01-15 LAB — GLUCOSE, CAPILLARY
Glucose-Capillary: 118 mg/dL — ABNORMAL HIGH (ref 70–99)
Glucose-Capillary: 281 mg/dL — ABNORMAL HIGH (ref 70–99)
Glucose-Capillary: 227 mg/dL — ABNORMAL HIGH (ref 70–99)
Glucose-Capillary: 228 mg/dL — ABNORMAL HIGH (ref 70–99)

## 2020-01-15 LAB — C DIFFICILE QUICK SCREEN W PCR REFLEX
C Diff antigen: NEGATIVE
C Diff interpretation: NOT DETECTED
C Diff toxin: NEGATIVE

## 2020-01-15 MED ORDER — FENOFIBRATE 160 MG PO TABS
160.0000 mg | ORAL_TABLET | Freq: Every day | ORAL | Status: DC
  Administered 2020-01-15 – 2020-01-16 (×2): 160 mg via ORAL
  Filled 2020-01-15 (×3): qty 1

## 2020-01-15 MED ORDER — LOPERAMIDE HCL 2 MG PO CAPS
4.0000 mg | ORAL_CAPSULE | ORAL | Status: DC | PRN
  Administered 2020-01-15: 4 mg via ORAL
  Filled 2020-01-15 (×4): qty 2

## 2020-01-15 NOTE — Progress Notes (Signed)
PROGRESS NOTE    Cody Cowan  VVO:[id number] DOB: September 21, 1965 DOA: 01/12/2020 PCP: Patient, No Pcp Per   Brief Narrative:  Cody Gatliff Hernandezis a 54 y.o.malewith medical history significant ofdiabetes with a hx of etoh abuse who presents after binge drinking episode on Saturday with alcoholic pancreatitis.    Assessment & Plan:  Alcoholic Pancreatitis:  -RUQ [place] without gallstones.  Hx binge drinking this wknd prior to symptoms.  No home meds.  Lipase: Elevated at 726 upon admission Continue IVF Strict I/O  Advance diet to low-fat diet.  Continue current medications and antiemetics as needed.    Hypertriglyceridemia: -Started on fibrate  Loose stools: C. difficile negative -Imodium as needed  Acute Kidney Injury:  Improved with IVF  Concern for hematuria: Repeat UA negative for hematuria  Hypophosphatemia: Replenished  Elevated LFTs: 2/2 etoh.  [place] as above.  Increased echogenictiy of liver.  Small volume ascities.  Suspect 2/2 etoh.   Acute hepatitis panel: Negative Improved AST/ALT, bili has bumped  Diabetes mellitus: Uncontrolled.  A1c: 11.2%.  Continue sliding scale insulin and Levemir 5 units. -Consult diabetic coordinator for appropriate insulin regime for discharge.  alcohol abuse with intoxication:Initiate CIWA protocol   hyponatremia: mild, continue to monitor with IVF  Thrombocytopenia: all counts down today, likely component of dilution.  likely 2/2 etoh use, follow.  Anticipating discharge tomorrow AM.  Patient does not have PCP.talked to case manager -will arrange open-door clinic for the patient tomorrow AM with appropriate discharge plan.  DVT prophylaxis: Lovenox Code Status: Full code Family Communication:  None present at bedside.  Plan of care discussed with patient in length and he verbalized understanding and agreed with it. Disposition Plan: Likely home tomorrow  Consultants:   None  Procedures:    None  Antimicrobials:   None  Status is: Inpatient  Dispo: The patient is from: home              Anticipated d/c is to: home              Anticipated d/c date is: 01/16/2020              Patient currently not medically stable for the discharge.   Subjective: Patient seen and examined.  Resting comfortably on the bed.  Tells me that he feels much better however he continues to have loose stool.  He had 4 loose stools this morning.  He denies nausea, vomiting or severe abdominal pain.  Objective: Vitals:   01/14/20 2351 01/15/20 0415 01/15/20 0733 01/15/20 1150  BP: 139/77 (!) 168/93 137/78   Pulse: 89 94 79 84  Resp: 18 18 15 16   Temp: 100.2 F (37.9 C) 98.4 F (36.9 C) 98.2 F (36.8 C) 98.9 F (37.2 C)  TempSrc: Oral Oral Oral Oral  SpO2: 99% 98% 100% 99%  Weight:      Height:        Intake/Output Summary (Last 24 hours) at 01/15/2020 1629 Last data filed at 01/15/2020 0118 Gross per 24 hour  Intake 780 ml  Output 1125 ml  Net -345 ml   Filed Weights   01/12/20 1443  Weight: 67.1 kg    Examination:  General exam: Appears calm and comfortable  Respiratory system: Clear to auscultation. Respiratory effort normal. Cardiovascular system: S1 & S2 heard, RRR. No JVD, murmurs, rubs, gallops or clicks. No pedal edema. Gastrointestinal system: Abdomen is nondistended, soft and nontender. No organomegaly or masses felt. Normal bowel sounds heard. Central nervous system: Alert and  oriented. No focal neurological deficits. Extremities: Symmetric 5 x 5 power. Skin: No rashes, lesions or ulcers Psychiatry: Judgement and insight appear normal. Mood & affect appropriate.    Data Reviewed: I have personally reviewed following labs and imaging studies  CBC: Recent Labs  Lab 01/12/20 1446 01/12/20 2112 01/13/20 0632 01/14/20 0409  WBC 5.9 8.1 8.0 5.4  NEUTROABS  --   --   --  3.6  HGB 13.9 13.6 13.6 10.9*  HCT 38.2* 37.7* 38.6* 30.7*  MCV 93.9 95.0 95.3 95.6   PLT 204 173 153 124*   Basic Metabolic Panel: Recent Labs  Lab 01/12/20 1446 01/12/20 1652 01/12/20 2112 01/13/20 0632 01/14/20 0409  NA 131* 132* 134* 134* 132*  K 4.0 3.8 3.8 4.0 3.5  CL 97* 93* 101 103 105  CO2 15* 21* 19* 20* 19*  GLUCOSE 341* 311* 218* 130* 126*  BUN 22* 23* 21* 28* 19  CREATININE 0.77 0.76 0.69 1.24 0.57*  CALCIUM 8.3* 8.1* 7.1* 7.2* 7.1*  MG  --   --  1.9  --  1.9  PHOS  --   --  3.0  --  1.4*   GFR: Estimated Creatinine Clearance: 92.7 mL/min (A) (by C-G formula based on SCr of 0.57 mg/dL (L)). Liver Function Tests: Recent Labs  Lab 01/12/20 1446 01/12/20 1652 01/12/20 2112 01/13/20 0632 01/14/20 0409  AST 72* 82* 72* 68* 65*  ALT 52* 56* 45* 42 31  ALKPHOS 156* 160* 127* 102 89  BILITOT 1.6* 1.8* 1.7* 2.3* 2.6*  PROT 6.4* 6.8 5.3* 5.3* 4.6*  ALBUMIN 3.3* 3.3* 2.6* 2.6* 2.3*   Recent Labs  Lab 01/12/20 1446 01/13/20 0632 01/14/20 0409  LIPASE 726* 289* 107*   No results for input(s): AMMONIA in the last 168 hours. Coagulation Profile: No results for input(s): INR, PROTIME in the last 168 hours. Cardiac Enzymes: No results for input(s): CKTOTAL, CKMB, CKMBINDEX, TROPONINI in the last 168 hours. BNP (last 3 results) No results for input(s): PROBNP in the last 8760 hours. HbA1C: Recent Labs    01/12/20 1951  HGBA1C 11.2*   CBG: Recent Labs  Lab 01/14/20 1134 01/14/20 1632 01/14/20 2102 01/15/20 0735 01/15/20 1146  GLUCAP 183* 85 168* 118* 281*   Lipid Profile: Recent Labs    01/13/20 0632  TRIG 1,915*   Thyroid Function Tests: No results for input(s): TSH, T4TOTAL, FREET4, T3FREE, THYROIDAB in the last 72 hours. Anemia Panel: No results for input(s): VITAMINB12, FOLATE, FERRITIN, TIBC, IRON, RETICCTPCT in the last 72 hours. Sepsis Labs: No results for input(s): PROCALCITON, LATICACIDVEN in the last 168 hours.  Recent Results (from the past 240 hour(s))  Respiratory Panel by RT PCR (Flu A&B, Covid) -  Nasopharyngeal Swab     Status: None   Collection Time: 01/12/20  6:21 PM   Specimen: Nasopharyngeal Swab  Result Value Ref Range Status   SARS Coronavirus 2 by RT PCR NEGATIVE NEGATIVE Final    Comment: (NOTE) SARS-CoV-2 target nucleic acids are NOT DETECTED.  The SARS-CoV-2 RNA is generally detectable in upper respiratoy specimens during the acute phase of infection. The lowest concentration of SARS-CoV-2 viral copies this assay can detect is 131 copies/mL. A negative result does not preclude SARS-Cov-2 infection and should not be used as the sole basis for treatment or other patient management decisions. A negative result may occur with  improper specimen collection/handling, submission of specimen other than nasopharyngeal swab, presence of viral mutation(s) within the areas targeted by this assay, and inadequate  number of viral copies (<131 copies/mL). A negative result must be combined with clinical observations, patient history, and epidemiological information. The expected result is Negative.  Fact Sheet for Patients:  https://www.moore.com/  Fact Sheet for Healthcare Providers:  https://www.young.biz/  This test is no t yet approved or cleared by the [place] FDA and  has been authorized for detection and/or diagnosis of SARS-CoV-2 by FDA under an Emergency Use Authorization (EUA). This EUA will remain  in effect (meaning this test can be used) for the duration of the COVID-19 declaration under Section 564(b)(1) of the Act, 21 U.S.C. section 360bbb-3(b)(1), unless the authorization is terminated or revoked sooner.     Influenza A by PCR NEGATIVE NEGATIVE Final   Influenza B by PCR NEGATIVE NEGATIVE Final    Comment: (NOTE) The Xpert Xpress SARS-CoV-2/FLU/RSV assay is intended as an aid in  the diagnosis of influenza from Nasopharyngeal swab specimens and  should not be used as a sole basis for treatment. Nasal washings and   aspirates are unacceptable for Xpert Xpress SARS-CoV-2/FLU/RSV  testing.  Fact Sheet for Patients: https://www.moore.com/  Fact Sheet for Healthcare Providers: https://www.young.biz/  This test is not yet approved or cleared by the [place] FDA and  has been authorized for detection and/or diagnosis of SARS-CoV-2 by  FDA under an Emergency Use Authorization (EUA). This EUA will remain  in effect (meaning this test can be used) for the duration of the  Covid-19 declaration under Section 564(b)(1) of the Act, 21  U.S.C. section 360bbb-3(b)(1), unless the authorization is  terminated or revoked. Performed at [facility], [address] [place], [place] [address]   C Difficile Quick Screen w PCR reflex     Status: None   Collection Time: 01/14/20  5:23 PM   Specimen: STOOL  Result Value Ref Range Status   C Diff antigen NEGATIVE NEGATIVE Final   C Diff toxin NEGATIVE NEGATIVE Final   C Diff interpretation No C. difficile detected.  Final    Comment: Performed at [facility], [address] [place], [place] [address]      Radiology Studies: DG Chest Taylor 1 View  Result Date: 01/14/2020 CLINICAL DATA:  Pancreatitis EXAM: PORTABLE CHEST 1 VIEW COMPARISON:  None similar FINDINGS: Mildly low lung volumes with streaky density at the bases. Normal heart size. No edema, air bronchogram, effusion, or pneumothorax. IMPRESSION: Low volume chest with atelectasis at the bases. Electronically Signed   By: Marnee Spring M.D.   On: 01/14/2020 07:52    Scheduled Meds: . (feeding supplement) PROSource Plus  30 mL Oral TID BM  . enoxaparin (LOVENOX) injection  40 mg Subcutaneous Q24H  . fenofibrate  160 mg Oral Q breakfast  . folic acid  1 mg Oral Daily  . insulin aspart  0-5 Units Subcutaneous QHS  . insulin aspart  0-9 Units Subcutaneous TID WC  . insulin detemir  5 Units Subcutaneous Daily  . multivitamin with minerals  1  tablet Oral Daily  . thiamine  100 mg Oral Daily   Or  . thiamine  100 mg Intravenous Daily   Continuous Infusions: . lactated ringers 125 mL/hr at 01/14/20 1839     LOS: 3 days   Time spent: 35 minutes   Cody Cowan Estill Cotta, MD [facility]  If 7PM-7AM, please contact night-coverage [email or web address] 01/15/2020, 4:29 PM

## 2020-01-16 ENCOUNTER — Other Ambulatory Visit: Payer: Self-pay

## 2020-01-16 LAB — CBC
HCT: 29.8 % — ABNORMAL LOW (ref 39.0–52.0)
Hemoglobin: 10.4 g/dL — ABNORMAL LOW (ref 13.0–17.0)
MCH: 33.7 pg (ref 26.0–34.0)
MCHC: 34.9 g/dL (ref 30.0–36.0)
MCV: 96.4 fL (ref 80.0–100.0)
Platelets: 147 10*3/uL — ABNORMAL LOW (ref 150–400)
RBC: 3.09 MIL/uL — ABNORMAL LOW (ref 4.22–5.81)
RDW: 12.5 % (ref 11.5–15.5)
WBC: 5.4 10*3/uL (ref 4.0–10.5)
nRBC: 0 % (ref 0.0–0.2)

## 2020-01-16 LAB — COMPREHENSIVE METABOLIC PANEL
ALT: 29 U/L (ref 0–44)
AST: 36 U/L (ref 15–41)
Albumin: 2.5 g/dL — ABNORMAL LOW (ref 3.5–5.0)
Alkaline Phosphatase: 106 U/L (ref 38–126)
Anion gap: 6 (ref 5–15)
BUN: 6 mg/dL (ref 6–20)
CO2: 26 mmol/L (ref 22–32)
Calcium: 8.4 mg/dL — ABNORMAL LOW (ref 8.9–10.3)
Chloride: 103 mmol/L (ref 98–111)
Creatinine, Ser: 0.6 mg/dL — ABNORMAL LOW (ref 0.61–1.24)
GFR, Estimated: >60 mL/min (ref >60)
Glucose, Bld: 160 mg/dL — ABNORMAL HIGH (ref 70–99)
Potassium: 3.4 mmol/L — ABNORMAL LOW (ref 3.5–5.1)
Sodium: 135 mmol/L (ref 135–145)
Total Bilirubin: 1.5 mg/dL — ABNORMAL HIGH (ref 0.3–1.2)
Total Protein: 5.6 g/dL — ABNORMAL LOW (ref 6.5–8.1)

## 2020-01-16 LAB — MAGNESIUM: Magnesium: 2 mg/dL (ref 1.7–2.4)

## 2020-01-16 LAB — GLUCOSE, CAPILLARY
Glucose-Capillary: 165 mg/dL — ABNORMAL HIGH (ref 70–99)
Glucose-Capillary: 337 mg/dL — ABNORMAL HIGH (ref 70–99)

## 2020-01-16 LAB — PHOSPHORUS: Phosphorus: 3 mg/dL (ref 2.5–4.6)

## 2020-01-16 MED ORDER — ACCU-CHEK SOFTCLIX LANCETS MISC: 1 refills | Status: DC

## 2020-01-16 MED ORDER — INSULIN STARTER KIT- SYRINGES (ENGLISH)
1.0000 | Freq: Once | Status: AC
  Administered 2020-01-16: 1
  Filled 2020-01-16: qty 1

## 2020-01-16 MED ORDER — INFLUENZA VAC SPLIT QUAD 0.5 ML IM SUSY: 0.5000 mL | PREFILLED_SYRINGE | INTRAMUSCULAR | Status: DC

## 2020-01-16 MED ORDER — FOLIC ACID 1 MG PO TABS
1.0000 mg | ORAL_TABLET | Freq: Every day | ORAL | 0 refills | Status: DC
Start: 2020-01-17 — End: 2020-01-17

## 2020-01-16 MED ORDER — INSULIN STARTER KIT- SYRINGES (ENGLISH): 1.0000 | Freq: Once | 0 refills | Status: AC

## 2020-01-16 MED ORDER — ADULT MULTIVITAMIN W/MINERALS CH
1.0000 | ORAL_TABLET | Freq: Every day | ORAL | 0 refills | Status: DC
Start: 2020-01-17 — End: 2021-08-09

## 2020-01-16 MED ORDER — FENOFIBRATE 160 MG PO TABS
160.0000 mg | ORAL_TABLET | Freq: Every day | ORAL | 0 refills | Status: DC
Start: 2020-01-17 — End: 2021-01-27

## 2020-01-16 MED ORDER — LIVING WELL WITH DIABETES BOOK - IN SPANISH
Freq: Once | Status: AC
  Filled 2020-01-16: qty 1

## 2020-01-16 MED ORDER — PNEUMOCOCCAL VAC POLYVALENT 25 MCG/0.5ML IJ INJ: 0.5000 mL | INJECTION | INTRAMUSCULAR | Status: DC

## 2020-01-16 MED ORDER — NOVOLIN 70/30 RELION (70-30) 100 UNIT/ML ~~LOC~~ SUSP: 5.0000 [IU] | Freq: Two times a day (BID) | SUBCUTANEOUS | 11 refills | Status: DC

## 2020-01-16 MED ORDER — THIAMINE HCL 100 MG PO TABS
100.0000 mg | ORAL_TABLET | Freq: Every day | ORAL | 0 refills | Status: DC
Start: 2020-01-17 — End: 2021-01-27

## 2020-01-16 MED ORDER — POTASSIUM CHLORIDE 20 MEQ PO PACK
40.0000 meq | PACK | Freq: Once | ORAL | Status: AC
  Administered 2020-01-16: 40 meq via ORAL
  Filled 2020-01-16: qty 2

## 2020-01-16 NOTE — Discharge Summary (Addendum)
Physician Discharge Summary  Cody Cowan PRF:[id number] DOB: 1965-07-23 DOA: 01/12/2020  PCP: Patient, No Pcp Per  Admit date: 01/12/2020 Discharge date: 01/16/2020  Admitted From:home Disposition: home  Recommendations for Outpatient Follow-up:  1. [facility] 2. Repeat CMP, CBC on follow-up visit 3. Take insulin 70/30 5 units twice daily  4. Monitor blood sugar closely at home 5. Advise carb consistent diet 6. Discussed about alcohol cessation  Home Health:none Equipment/Devices: Glucometer Discharge Condition: Stable CODE STATUS: Full code Diet recommendation: Carb consistent diet/low-fat diet  Brief/Interim Summary:  Cody Matusek Hernandezis a 54 y.o.malewith medical history significant ofdiabeteswith a hx of etoh abuse who presents after binge drinking episode on Saturday with alcoholic pancreatitis.  Alcoholic Pancreatitis:  -RUQ [place] without gallstones.  Hx binge drinking. Lipase: Elevated at 726 upon admission Patient started on IV fluids, pain medications and antiemetics as needed.  His symptoms improved significantly and his diet was advanced with clear liquid and then low-fat diet which he tolerated very well.  His symptoms improved and he remained pain-free on the day of discharge.  Continue IVF  Hypertriglyceridemia: -Started and discharged home on on fibrate  Loose stools: C. difficile negative -Resolved.  Acute Kidney Injury:  Improved with IVF  Concern for hematuria: Repeat UA negative for hematuria.  Patient denied hematuria.  Hypophosphatemia: Replenished  Elevated LFTs: 2/2 etoh.    Right upper quadrant ultrasound showed increased echogenictiy of liver.  Small volume ascities.  Suspect 2/2 etoh.   Acute hepatitis panel: Negative Liver enzymes improved.  Total bilirubin level improved.  Diabetes mellitus: Uncontrolled.  A1c: 11.2%.    Patient started on sliding scale insulin and Levemir 5  units during hospitalization.   -Consulted diabetes coordinator recommended Novolin Relion 70/30 5 units twice daily  alcohol abuse with intoxication:Patient started on CIWA protocol.  He is seems to be pleasant and alert and oriented on day of discharge.  hyponatremia: Resolved with IV fluids  Hypokalemia: Potassium 3.4.  Replenished. -Repeat CMP on follow-up with PCP  Thrombocytopenia: Likely secondary to alcohol abuse.  Platelet: 147 on the day of discharge.  Moderate malnutrition: -Consulted dietitian.  Appreciate help.  Albumin: 2.5. -Recommended Prosource plus liquid 30 mL with meals.  Discharge Diagnoses:  Acute pancreatitis secondary to alcoholism Hypertriglyceridemia Loose stools AKI Hematuria Hypophosphatemia Elevated liver enzymes Uncontrolled diabetes mellitus Alcohol abuse with intoxication Hyponatremia Hypokalemia Thrombocytopenia  Discharge Instructions  Discharge Instructions    Diet Carb Modified   Complete by: As directed    Discharge instructions   Complete by: As directed    Follow-up with PCP in 1 week Repeat CBC and CMP on follow-up visit Carb consistent diet Avoid alcohol Take insulin Novolin 70/30 5 units twice daily   Increase activity slowly   Complete by: As directed      Allergies as of 01/16/2020   No Known Allergies     Medication List    TAKE these medications   Accu-Chek Softclix Lancets lancets Use as directed.   fenofibrate 160 MG tablet Take 1 tablet (160 mg total) by mouth daily with breakfast. Start taking on: January 17, 9356   folic acid 1 MG tablet Commonly known as: FOLVITE Take 1 tablet (1 mg total) by mouth daily. Start taking on: January 17, 2020   insulin starter kit- syringes Misc 1 kit by Other route once for 1 dose.   multivitamin with minerals Tabs tablet Take 1 tablet by mouth daily. Start taking on: January 17, 2020   NovoLIN 70/30 ReliOn (70-30) 100 UNIT/ML injection Generic drug:  insulin NPH-regular Human Inject 5 Units into the skin 2 (two) times daily with a meal.   thiamine 100 MG tablet Take 1 tablet (100 mg total) by mouth daily. Start taking on: January 17, 2020       No Known Allergies  Consultations:  None   Procedures/Studies: CT ABDOMEN PELVIS W CONTRAST  Result Date: 01/12/2020 CLINICAL DATA:  Diffuse abdominal pain, nausea and vomiting EXAM: CT ABDOMEN AND PELVIS WITH CONTRAST TECHNIQUE: Multidetector CT imaging of the abdomen and pelvis was performed using the standard protocol following bolus administration of intravenous contrast. CONTRAST:  149m OMNIPAQUE IOHEXOL 300 MG/ML  SOLN COMPARISON:  None. FINDINGS: Lower chest: No acute pleural or parenchymal lung disease. Hepatobiliary: Diffuse hepatic steatosis. No focal liver abnormality. The gallbladder is unremarkable. Pancreas: There are marked inflammatory changes surrounding the pancreas consistent with acute pancreatitis. The pancreatic parenchyma enhances normally. No pancreatic duct dilation. Spleen: Normal in size without focal abnormality. 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 The right adrenal is unremarkable. Kidneys enhance normally and symmetrically. No urinary tract calculi or obstruction. The bladder is unremarkable. Stomach/Bowel: Mildly dilated loops of proximal small bowel, with associated mild wall thickening, likely reflect localized ileus given the adjacent inflammatory changes from pancreatitis. No high-grade obstruction. Normal appendix right lower quadrant. Vascular/Lymphatic: Aortic atherosclerosis. No enlarged abdominal or pelvic lymph nodes. Reproductive: Prostate is unremarkable. Other: Moderate ascites is seen within the upper abdomen, related to the acute pancreatitis described above. No fluid collection or abscess. No free intraperitoneal gas. No abdominal wall hernia. Musculoskeletal: No acute or destructive bony  lesions. Reconstructed images demonstrate no additional findings. IMPRESSION: 1. Findings consistent with acute pancreatitis. There is peripancreatic mesenteric fat stranding and free fluid throughout the upper abdomen. No localized fluid collection, abscess, or pseudocyst. 2. Mild proximal jejunal wall thickening and dilation consistent with localized ileus. 3. Diffuse hepatic steatosis. 4.  Aortic Atherosclerosis (ICD10-I70.0). Electronically Signed   By: MRanda NgoM.D.   On: 01/12/2020 18:38   DG Chest Port 1 View  Result Date: 01/14/2020 CLINICAL DATA:  Pancreatitis EXAM: PORTABLE CHEST 1 VIEW COMPARISON:  None similar FINDINGS: Mildly low lung volumes with streaky density at the bases. Normal heart size. No edema, air bronchogram, effusion, or pneumothorax. IMPRESSION: Low volume chest with atelectasis at the bases. Electronically Signed   By: JMonte FantasiaM.D.   On: 01/14/2020 07:52   [place]Abdomen Limited RUQ (LIVER/GB)  Result Date: 01/13/2020 CLINICAL DATA:  Pancreatitis EXAM: ULTRASOUND ABDOMEN LIMITED RIGHT UPPER QUADRANT COMPARISON:  CT 01/12/2020 FINDINGS: Gallbladder: No gallstones or wall thickening visualized. No sonographic Murphy sign noted by sonographer. Common bile duct: Diameter: 4 mm. Liver: No focal lesion identified. Diffusely increased hepatic parenchymal echogenicity. Portal vein is patent on color Doppler imaging with normal direction of blood flow towards the liver. Other: Small volume ascites. IMPRESSION: 1. The echogenicity of the liver is increased. This is a nonspecific finding but is most commonly seen with fatty infiltration of the liver. There are no obvious focal liver lesions. 2. Small volume ascites. Electronically Signed   By: NDavina PokeD.O.   On: 01/13/2020 09:18      Subjective:  Patient seen and examined.  Resting comfortably on the bed.  Tells me that he is ready to go home today.  Denies any complaints such as abdominal pain, nausea or vomiting.   Tells me that his  loose stools have resolved.  Discharge Exam: Vitals:   01/15/20 2358 01/16/20 0837  BP: 126/77 (!) 154/92  Pulse: 93 87  Resp: 18 18  Temp: 99.3 F (37.4 C) 98.8 F (37.1 C)  SpO2: 95% 100%   Vitals:   01/15/20 1150 01/15/20 2104 01/15/20 2358 01/16/20 0837  BP:  136/82 126/77 (!) 154/92  Pulse: 84 91 93 87  Resp: _0 Temp: 98.9 F (37.2 C) (!) 100.4 F (38 C) 99.3 F (37.4 C) 98.8 F (37.1 C)  TempSrc: Oral Oral Oral Oral  SpO2: 99% 98% 95% 100%  Weight:      Height:        General: Pt is alert, awake, not in acute distress Cardiovascular: RRR, S1/S2 +, no rubs, no gallops Respiratory: CTA bilaterally, no wheezing, no rhonchi Abdominal: Soft, NT, ND, bowel sounds + Extremities: no edema, no cyanosis    The results of significant diagnostics from this hospitalization (including imaging, microbiology, ancillary and laboratory) are listed below for reference.     Microbiology: Recent Results (from the past 240 hour(s))  Respiratory Panel by RT PCR (Flu A&B, Covid) - Nasopharyngeal Swab     Status: None   Collection Time: 01/12/20  6:21 PM   Specimen: Nasopharyngeal Swab  Result Value Ref Range Status   SARS Coronavirus 2 by RT PCR NEGATIVE NEGATIVE Final    Comment: (NOTE) SARS-CoV-2 target nucleic acids are NOT DETECTED.  The SARS-CoV-2 RNA is generally detectable in upper respiratoy specimens during the acute phase of infection. The lowest concentration of SARS-CoV-2 viral copies this assay can detect is 131 copies/mL. A negative result does not preclude SARS-Cov-2 infection and should not be used as the sole basis for treatment or other patient management decisions. A negative result may occur with  improper specimen collection/handling, submission of specimen other than nasopharyngeal swab, presence of viral mutation(s) within the areas targeted by this assay, and inadequate number of viral copies (<131 copies/mL). A negative  result must be combined with clinical observations, patient history, and epidemiological information. The expected result is Negative.  Fact Sheet for Patients:  PinkCheek.be  Fact Sheet for Healthcare Providers:  GravelBags.it  This test is no t yet approved or cleared by the [place] FDA and  has been authorized for detection and/or diagnosis of SARS-CoV-2 by FDA under an Emergency Use Authorization (EUA). This EUA will remain  in effect (meaning this test can be used) for the duration of the COVID-19 declaration under Section 564(b)(1) of the Act, 21 U.S.C. section 360bbb-3(b)(1), unless the authorization is terminated or revoked sooner.     Influenza A by PCR NEGATIVE NEGATIVE Final   Influenza B by PCR NEGATIVE NEGATIVE Final    Comment: (NOTE) The Xpert Xpress SARS-CoV-2/FLU/RSV assay is intended as an aid in  the diagnosis of influenza from Nasopharyngeal swab specimens and  should not be used as a sole basis for treatment. Nasal washings and  aspirates are unacceptable for Xpert Xpress SARS-CoV-2/FLU/RSV  testing.  Fact Sheet for Patients: PinkCheek.be  Fact Sheet for Healthcare Providers: GravelBags.it  This test is not yet approved or cleared by the [place] FDA and  has been authorized for detection and/or diagnosis of SARS-CoV-2 by  FDA under an Emergency Use Authorization (EUA). This EUA will remain  in effect (meaning this test can be used) for the duration of the  Covid-19 declaration under Section 564(b)(1) of the Act, 21  U.S.C. section 360bbb-3(b)(1), unless the authorization is  terminated or revoked. Performed at [facility], [address] [place], [place] [address]   C Difficile Quick Screen w PCR reflex     Status: None   Collection Time: 01/14/20  5:23 PM   Specimen: STOOL  Result Value Ref Range Status   C Diff  antigen NEGATIVE NEGATIVE Final   C Diff toxin NEGATIVE NEGATIVE Final   C Diff interpretation No C. difficile detected.  Final    Comment: Performed at [facility] [address] [place], [place] [address]     Labs: BNP (last 3 results) No results for input(s): BNP in the last 8760 hours. Basic Metabolic Panel: Recent Labs  Lab 01/12/20 1652 01/12/20 2112 01/13/20 0632 01/14/20 0409 01/16/20 0448  NA 132* 134* 134* 132* 135  K 3.8 3.8 4.0 3.5 3.4*  CL 93* 101 103 105 103  CO2 21* 19* 20* 19* 26  GLUCOSE 311* 218* 130* 126* 160*  BUN 23* 21* 28* 19 6  CREATININE 0.76 0.69 1.24 0.57* 0.60*  CALCIUM 8.1* 7.1* 7.2* 7.1* 8.4*  MG  --  1.9  --  1.9 2.0  PHOS  --  3.0  --  1.4* 3.0   Liver Function Tests: Recent Labs  Lab 01/12/20 1652 01/12/20 2112 01/13/20 0632 01/14/20 0409 01/16/20 0448  AST 82* 72* 68* 65* 36  ALT 56* 45* 42 31 29  ALKPHOS 160* 127* 102 89 106  BILITOT 1.8* 1.7* 2.3* 2.6* 1.5*  PROT 6.8 5.3* 5.3* 4.6* 5.6*  ALBUMIN 3.3* 2.6* 2.6* 2.3* 2.5*   Recent Labs  Lab 01/12/20 1446 01/13/20 0632 01/14/20 0409  LIPASE 726* 289* 107*   No results for input(s): AMMONIA in the last 168 hours. CBC: Recent Labs  Lab 01/12/20 1446 01/12/20 2112 01/13/20 0632 01/14/20 0409 01/16/20 0448  WBC 5.9 8.1 8.0 5.4 5.4  NEUTROABS  --   --   --  3.6  --   HGB 13.9 13.6 13.6 10.9* 10.4*  HCT 38.2* 37.7* 38.6* 30.7* 29.8*  MCV 93.9 95.0 95.3 95.6 96.4  PLT 204 173 153 124* 147*   Cardiac Enzymes: No results for input(s): CKTOTAL, CKMB, CKMBINDEX, TROPONINI in the last 168 hours. BNP: Invalid input(s): POCBNP CBG: Recent Labs  Lab 01/15/20 0735 01/15/20 1146 01/15/20 1634 01/15/20 2144 01/16/20 0744  GLUCAP 118* 281* 227* 228* 165*   D-Dimer No results for input(s): DDIMER in the last 72 hours. Hgb A1c No results for input(s): HGBA1C in the last 72 hours. Lipid Profile No results for input(s): CHOL, HDL, LDLCALC, TRIG, CHOLHDL,  LDLDIRECT in the last 72 hours. Thyroid function studies No results for input(s): TSH, T4TOTAL, T3FREE, THYROIDAB in the last 72 hours.  Invalid input(s): FREET3 Anemia work up No results for input(s): VITAMINB12, FOLATE, FERRITIN, TIBC, IRON, RETICCTPCT in the last 72 hours. Urinalysis    Component Value Date/Time   COLORURINE YELLOW (A) 01/14/2020 1947   APPEARANCEUR CLEAR (A) 01/14/2020 1947   LABSPEC 1.003 (L) 01/14/2020 1947   PHURINE 6.0 01/14/2020 1947   GLUCOSEU >=500 (A) 01/14/2020 1947   HGBUR NEGATIVE 01/14/2020 [place] NEGATIVE 01/14/2020 [place] NEGATIVE 01/14/2020 [place] NEGATIVE 01/14/2020 1947   NITRITE NEGATIVE 01/14/2020 1947   LEUKOCYTESUR NEGATIVE 01/14/2020 1947   Sepsis Labs Invalid input(s): PROCALCITONIN,  WBC,  LACTICIDVEN Microbiology Recent Results (from the past 240 hour(s))  Respiratory Panel by RT PCR (Flu A&B, Covid) - Nasopharyngeal Swab     Status: None  Collection Time: 01/12/20  6:21 PM   Specimen: Nasopharyngeal Swab  Result Value Ref Range Status   SARS Coronavirus 2 by RT PCR NEGATIVE NEGATIVE Final    Comment: (NOTE) SARS-CoV-2 target nucleic acids are NOT DETECTED.  The SARS-CoV-2 RNA is generally detectable in upper respiratoy specimens during the acute phase of infection. The lowest concentration of SARS-CoV-2 viral copies this assay can detect is 131 copies/mL. A negative result does not preclude SARS-Cov-2 infection and should not be used as the sole basis for treatment or other patient management decisions. A negative result may occur with  improper specimen collection/handling, submission of specimen other than nasopharyngeal swab, presence of viral mutation(s) within the areas targeted by this assay, and inadequate number of viral copies (<131 copies/mL). A negative result must be combined with clinical observations, patient history, and epidemiological information. The expected result is  Negative.  Fact Sheet for Patients:  PinkCheek.be  Fact Sheet for Healthcare Providers:  GravelBags.it  This test is no t yet approved or cleared by the [place] FDA and  has been authorized for detection and/or diagnosis of SARS-CoV-2 by FDA under an Emergency Use Authorization (EUA). This EUA will remain  in effect (meaning this test can be used) for the duration of the COVID-19 declaration under Section 564(b)(1) of the Act, 21 U.S.C. section 360bbb-3(b)(1), unless the authorization is terminated or revoked sooner.     Influenza A by PCR NEGATIVE NEGATIVE Final   Influenza B by PCR NEGATIVE NEGATIVE Final    Comment: (NOTE) The Xpert Xpress SARS-CoV-2/FLU/RSV assay is intended as an aid in  the diagnosis of influenza from Nasopharyngeal swab specimens and  should not be used as a sole basis for treatment. Nasal washings and  aspirates are unacceptable for Xpert Xpress SARS-CoV-2/FLU/RSV  testing.  Fact Sheet for Patients: PinkCheek.be  Fact Sheet for Healthcare Providers: GravelBags.it  This test is not yet approved or cleared by the [place] FDA and  has been authorized for detection and/or diagnosis of SARS-CoV-2 by  FDA under an Emergency Use Authorization (EUA). This EUA will remain  in effect (meaning this test can be used) for the duration of the  Covid-19 declaration under Section 564(b)(1) of the Act, 21  U.S.C. section 360bbb-3(b)(1), unless the authorization is  terminated or revoked. Performed at [facility], [address] [place], [place] [address]   C Difficile Quick Screen w PCR reflex     Status: None   Collection Time: 01/14/20  5:23 PM   Specimen: STOOL  Result Value Ref Range Status   C Diff antigen NEGATIVE NEGATIVE Final   C Diff toxin NEGATIVE NEGATIVE Final   C Diff interpretation No C. difficile detected.   Final    Comment: Performed at [facility], [address] [place], [place] [address]     Time coordinating discharge: Over 30 minutes  SIGNED:   Mckinley Jewel, MD  [facility] 01/16/2020, 10:48 AM Pager   If 7PM-7AM, please contact night-coverage [email or web address]

## 2020-01-16 NOTE — TOC Progression Note (Addendum)
Transition of Care [facility]) - Progression Note    Patient Details  Name: Cody Cowan MRN: [id number] Date of Birth: 09/11/1965  Transition of Care [facility]) CM/SW [place], LCSW Phone Number: 01/16/2020, 10:46 AM  Clinical Narrative:   CSW called Medication Management and spoke with Lonn Georgia who confirmed they have the insulin patient needs in stock at this time.  CSW met with patient at bedside using [facility] # [phone]. Patient to discharge home today. Patient confirmed he has a ride home from his sister whom he lives with. Sister will pick him up around 3 or 4. Provided Diabetic Kit to patient. Explained that medications will be prescribed to Medication Management and patient will need to follow up with [facility] for primary care. Patient verbalized understanding. Referral sent to [facility] so they can follow up with patient as well. Patient reported he does not have a way to pick up his medications before Medication Management closes at 4pm, [facility] member will pick up medications and deliver to room, patient should not discharge until these are delivered to his room, patient understands. Substance Abuse resources already provided by [facility] CSW Teresita during this admission. Patient denied additional needs prior to discharge.  2:23- TOC Assistant Holly delivering medications to RN on Unit.   Expected Discharge Plan: [place] Barriers to Discharge: Continued Medical Work up, Inadequate or no insurance  Expected Discharge Plan and Services Expected Discharge Plan: [place] In-house Referral: Clinical Social Work     Living arrangements for the past 2 months: Single Family Home                                       Social Determinants of Health (SDOH) Interventions    Readmission Risk Interventions No flowsheet data found.

## 2020-01-16 NOTE — Progress Notes (Addendum)
Inpatient Diabetes Program Recommendations  AACE/ADA: New Consensus Statement on Inpatient Glycemic Control (2015)  Target Ranges:  Prepandial:   less than 140 mg/dL      Peak postprandial:   less than 180 mg/dL (1-2 hours)      Critically ill patients:  140 - 180 mg/dL   Lab Results  Component Value Date   GLUCAP 165 (H) 01/16/2020   HGBA1C 11.2 (H) 01/12/2020    Review of Glycemic Control  Inpatient Diabetes Program Recommendations:   Patient to be discharged home on insulin with not insurance @ this time. Secure chatted with Dr. Jacqulyn Bath and discussed via phone with RN Sander Nephew regarding insulin teaching.  RN plans to teach patient insulin administration, timing of insulin and signs,symptoms, and treatment of hypoglycemia.  Discharge needs: Called [facility] and Basaglar and Humalog per insulin pens are available. Communicated with Dr. Pollie Meyer and Transitions of Care.  Basaglar kwikpen (order # [id number]) Insulin pens 32 gauge x 60mm [phone])  CBG Meter and Supplies (Order # [id number])  Thank you, Billy Fischer. Anvita Hirata, RN, MSN, CDE  Diabetes Coordinator Inpatient Glycemic Control Team Team Pager [phone] (8am-5pm) 01/16/2020 10:18 AM

## 2020-01-16 NOTE — Discharge Instructions (Signed)
Pancreatitis aguda Acute Pancreatitis  La pancreatitis aguda ocurre cuando el pncreas se hincha. El pncreas es una glndula grande del cuerpo que ayuda a Psychologist, clinical. Tambin produce enzimas que ayudan a Retail buyer. Esta afeccin puede durar [employer] y causar problemas graves. Los pulmones, el corazn y los riones pueden dejar de funcionar. Cules son las causas? Estas pueden incluir las siguientes:  Consumo excesivo de alcohol.  Abuso de drogas.  Clculos biliares.  Un tumor en el pncreas. Otras causas son:  Algunos medicamentos.  Algunos productos qumicos.  Diabetes.  Infeccin.  Dao producido por un accidente.  La sustancia venenosa (veneno) de una picadura de escorpin.  Una ciurga en el vientre (abdominal).  El ataque por parte del sistema de defensa del cuerpo (sistema inmunitario) al pncreas (pancreatitis autoinmunitaria).  Genes que se transmiten de padres a hijos (hereditarios). En algunos casos, se desconoce la causa. Cules son los signos o los sntomas?  Dolor en la parte superior del vientre que puede sentirse en la espalda. El dolor puede ser muy intenso.  Hinchazn en el estmago.  Malestar estomacal (nuseas) y vmitos.  Grant Ruts. Cmo se trata? Es probable que daba Engineer, maintenance hospital. El tratamiento puede incluir:  Tomar analgsicos.  Lquidos a travs de un tubo (catter) intravenoso.  Colocar un tubo en el estmago para extraer el contenido del [place]. Esto puede ayudarlo a Materials engineer.  No comer durante 3 o 4 das.  Antibiticos en el caso de una infeccin.  Tratar cualquier otro problema que pueda ser la causa.  Medicamentos con corticoesteroides, si la causa de su problema es que su sistema de defensa ataca los tejidos propios del cuerpo.  Cody Cowan. Siga estas instrucciones en su casa: Comida y bebida   Siga las indicaciones del mdico sobre qu comer y Product manager.  Consuma  alimentos que no tengan [place].  Consuma pequeas cantidades de comida con ms frecuencia. No coma en gran cantidad.  Beba suficiente lquido para Radio producer pis (la orina) de color amarillo plido.  No consuma alcohol si esta fue la causa de su afeccin. Medicamentos  [employer] de venta libre y los recetados solamente como se lo haya indicado el mdico.  Consulte a su mdico si el medicamento que le recetaron: ? Hace que sea necesario que evite conducir o usar maquinaria pesada. ? Puede causarle dificultad para defecar (estreimiento). Es posible que deba tomar medidas para prevenir o tratar los problemas para defecar:  Tome medicamentos recetados o de Sales promotion account executive.  Coma alimentos ricos en fibra. Entre ellos, frijoles, cereales integrales y frutas y verduras frescas.  Limite los alimentos con alto contenido de [place] y International aid/development worker. Estos incluyen alimentos fritos o dulces. Indicaciones generales  No consuma ningn producto que contenga nicotina o tabaco, como cigarrillos, cigarrillos electrnicos y tabaco de Theatre manager. Si necesita ayuda para dejar de consumir, consulte al mdico.  Descanse mucho.  Controle su nivel de Production assistant, radio en su casa tal como le indic el mdico.  Concurra a todas las visitas de seguimiento como se lo haya indicado el mdico. Esto es importante. Comunquese con un mdico si:  No mejora en el tiempo en que se esperaba.  Aparecen nuevos sntomas.  Sus sntomas empeoran.  Tiene dolor o debilidad que dura [employer].  Siente Agricultural consultant.  Comienza a mejorar y luego tiene dolor nuevamente.  Tiene fiebre. Solicite ayuda inmediatamente si:  No puede comer ni retener lquidos.  El dolor es muy intenso.  La piel o la parte blanca de los ojos se tornan de color amarillento.  El vientre se le hincha de manera sbita.  Vomita.  Se siente mareado o se desvanece (se desmaya).  Su nivel de azcar en la sangre es alto  (ms de 300mg/dl). Resumen  La pancreatitis aguda ocurre cuando el pncreas se hincha.  Normalmente, esta afeccin es causada por el consumo excesivo de alcohol o drogas o la presencia de clculos biliares.  Es probable que daba permanecer en el hospital para recibir tratamiento. Esta informacin no tiene como fin reemplazar el consejo del mdico. Asegrese de hacerle al mdico cualquier pregunta que tenga. Document Revised: 01/18/2018 Document Reviewed: 01/18/2018 Elsevier Patient Education  2020 Elsevier Inc.  

## 2020-01-16 NOTE — Progress Notes (Signed)
Pt discharged and given AVS, prescription medications from med management with insulin needles, as well as open door clinic information. Pt educated on the use of these via interpreter and verbalizes understanding

## 2020-03-02 ENCOUNTER — Telehealth: Payer: Self-pay | Admitting: Pharmacy Technician

## 2020-03-02 NOTE — Telephone Encounter (Signed)
Contacted patient using [employer], because patient speaks Spanish.  Interpretation provided by Vikki Ports, [id number]. Patient does not have an address in [place]. Attempted to obtain mailing address during phone conversation.  Patient stated that he did not know his address.  Will try to find out and call [facility] with that information.  Made patient aware that [facility] could not provide additional medication assistance because he had not provided financial information.  Patient stated that he was working out of town and would contact [facility] when he needed our services.  Sherilyn Dacosta Care Manager [facility]

## 2021-01-27 ENCOUNTER — Emergency Department: Payer: Self-pay

## 2021-01-27 ENCOUNTER — Other Ambulatory Visit: Payer: Self-pay

## 2021-01-27 ENCOUNTER — Inpatient Hospital Stay
Admission: EM | Admit: 2021-01-27 | Discharge: 2021-02-01 | DRG: 637 | Disposition: A | Discharge status: Home / Self Care | Payer: Self-pay | Attending: Internal Medicine | Admitting: Internal Medicine

## 2021-01-27 ENCOUNTER — Observation Stay: Payer: Self-pay

## 2021-01-27 DIAGNOSIS — R7989 Other specified abnormal findings of blood chemistry: Secondary | ICD-10-CM | POA: Diagnosis present

## 2021-01-27 DIAGNOSIS — R7401 Elevation of levels of liver transaminase levels: Secondary | ICD-10-CM

## 2021-01-27 DIAGNOSIS — E43 Unspecified severe protein-calorie malnutrition: Secondary | ICD-10-CM | POA: Diagnosis present

## 2021-01-27 DIAGNOSIS — Z20822 Contact with and (suspected) exposure to covid-19: Secondary | ICD-10-CM | POA: Diagnosis present

## 2021-01-27 DIAGNOSIS — Z23 Encounter for immunization: Secondary | ICD-10-CM

## 2021-01-27 DIAGNOSIS — E118 Type 2 diabetes mellitus with unspecified complications: Secondary | ICD-10-CM

## 2021-01-27 DIAGNOSIS — D72819 Decreased white blood cell count, unspecified: Secondary | ICD-10-CM | POA: Diagnosis present

## 2021-01-27 DIAGNOSIS — Z91120 Patient's intentional underdosing of medication regimen due to financial hardship: Secondary | ICD-10-CM

## 2021-01-27 DIAGNOSIS — Z6820 Body mass index (BMI) 20.0-20.9, adult: Secondary | ICD-10-CM

## 2021-01-27 DIAGNOSIS — T383X6A Underdosing of insulin and oral hypoglycemic [antidiabetic] drugs, initial encounter: Secondary | ICD-10-CM | POA: Diagnosis present

## 2021-01-27 DIAGNOSIS — E11621 Type 2 diabetes mellitus with foot ulcer: Principal | ICD-10-CM | POA: Diagnosis present

## 2021-01-27 DIAGNOSIS — E11628 Type 2 diabetes mellitus with other skin complications: Secondary | ICD-10-CM

## 2021-01-27 DIAGNOSIS — Z89022 Acquired absence of left finger(s): Secondary | ICD-10-CM

## 2021-01-27 DIAGNOSIS — F1021 Alcohol dependence, in remission: Secondary | ICD-10-CM | POA: Diagnosis present

## 2021-01-27 DIAGNOSIS — E781 Pure hyperglyceridemia: Secondary | ICD-10-CM | POA: Diagnosis present

## 2021-01-27 DIAGNOSIS — L97528 Non-pressure chronic ulcer of other part of left foot with other specified severity: Secondary | ICD-10-CM | POA: Diagnosis present

## 2021-01-27 DIAGNOSIS — E1140 Type 2 diabetes mellitus with diabetic neuropathy, unspecified: Secondary | ICD-10-CM | POA: Diagnosis present

## 2021-01-27 DIAGNOSIS — D696 Thrombocytopenia, unspecified: Secondary | ICD-10-CM | POA: Diagnosis present

## 2021-01-27 DIAGNOSIS — E1165 Type 2 diabetes mellitus with hyperglycemia: Secondary | ICD-10-CM

## 2021-01-27 LAB — CBC WITH DIFFERENTIAL/PLATELET
Abs Immature Granulocytes: 0.06 10*3/uL (ref 0.00–0.07)
Basophils Absolute: 0 10*3/uL (ref 0.0–0.1)
Basophils Relative: 1 %
Eosinophils Absolute: 0 10*3/uL (ref 0.0–0.5)
Eosinophils Relative: 1 %
HCT: 34.4 % — ABNORMAL LOW (ref 39.0–52.0)
Hemoglobin: 11.8 g/dL — ABNORMAL LOW (ref 13.0–17.0)
Immature Granulocytes: 2 %
Lymphocytes Relative: 35 %
Lymphs Abs: 1.3 10*3/uL (ref 0.7–4.0)
MCH: 34.2 pg — ABNORMAL HIGH (ref 26.0–34.0)
MCHC: 34.3 g/dL (ref 30.0–36.0)
MCV: 99.7 fL (ref 80.0–100.0)
Monocytes Absolute: 0.2 10*3/uL (ref 0.1–1.0)
Monocytes Relative: 6 %
Neutro Abs: 2 10*3/uL (ref 1.7–7.7)
Neutrophils Relative %: 55 %
Platelets: 106 10*3/uL — ABNORMAL LOW (ref 150–400)
RBC: 3.45 MIL/uL — ABNORMAL LOW (ref 4.22–5.81)
RDW: 12.8 % (ref 11.5–15.5)
Smear Review: NORMAL
WBC: 3.6 10*3/uL — ABNORMAL LOW (ref 4.0–10.5)
nRBC: 0 % (ref 0.0–0.2)

## 2021-01-27 LAB — COMPREHENSIVE METABOLIC PANEL
ALT: 56 U/L — ABNORMAL HIGH (ref 0–44)
AST: 76 U/L — ABNORMAL HIGH (ref 15–41)
Albumin: 3.3 g/dL — ABNORMAL LOW (ref 3.5–5.0)
Alkaline Phosphatase: 416 U/L — ABNORMAL HIGH (ref 38–126)
Anion gap: 7 (ref 5–15)
BUN: 13 mg/dL (ref 6–20)
CO2: 27 mmol/L (ref 22–32)
Calcium: 9.2 mg/dL (ref 8.9–10.3)
Chloride: 92 mmol/L — ABNORMAL LOW (ref 98–111)
Creatinine, Ser: 0.68 mg/dL (ref 0.61–1.24)
GFR, Estimated: >60 mL/min (ref >60)
Glucose, Bld: 527 mg/dL (ref 70–99)
Potassium: 4.8 mmol/L (ref 3.5–5.1)
Sodium: 126 mmol/L — ABNORMAL LOW (ref 135–145)
Total Bilirubin: 1.3 mg/dL — ABNORMAL HIGH (ref 0.3–1.2)
Total Protein: 7.4 g/dL (ref 6.5–8.1)

## 2021-01-27 LAB — RESP PANEL BY RT-PCR (FLU A&B, COVID) ARPGX2
Influenza A by PCR: NEGATIVE
Influenza B by PCR: NEGATIVE
SARS Coronavirus 2 by RT PCR: NEGATIVE

## 2021-01-27 LAB — CBG MONITORING, ED
Glucose-Capillary: 383 mg/dL — ABNORMAL HIGH (ref 70–99)
Glucose-Capillary: 416 mg/dL — ABNORMAL HIGH (ref 70–99)

## 2021-01-27 LAB — MRSA NEXT GEN BY PCR, NASAL: MRSA by PCR Next Gen: NOT DETECTED

## 2021-01-27 IMAGING — CR DG FOOT COMPLETE 3+V*L*
1 series · 3 of 3 positions shown · non-contrast
Comparison: None.

CLINICAL DATA: Left lateral foot pain since falling off of a ladder
20 days ago. Pain/wound.

EXAM:
LEFT FOOT - COMPLETE 3+ VIEW

[Series 1: dg foot complete left · 0.14mm/px · 3 of 3 slices shown]
[im 1/3]
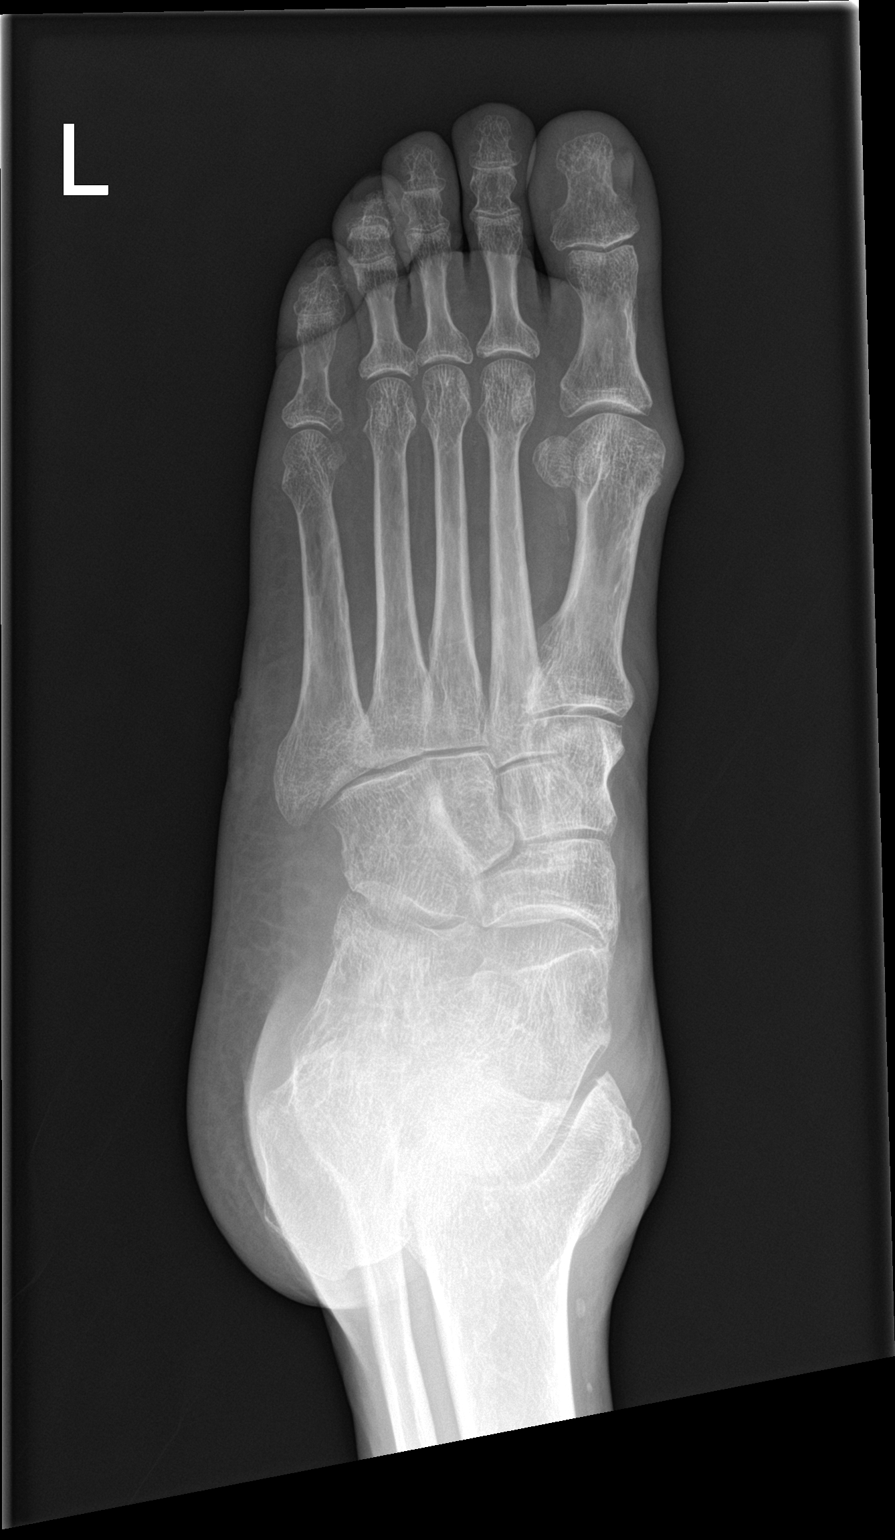
[im 2/3]
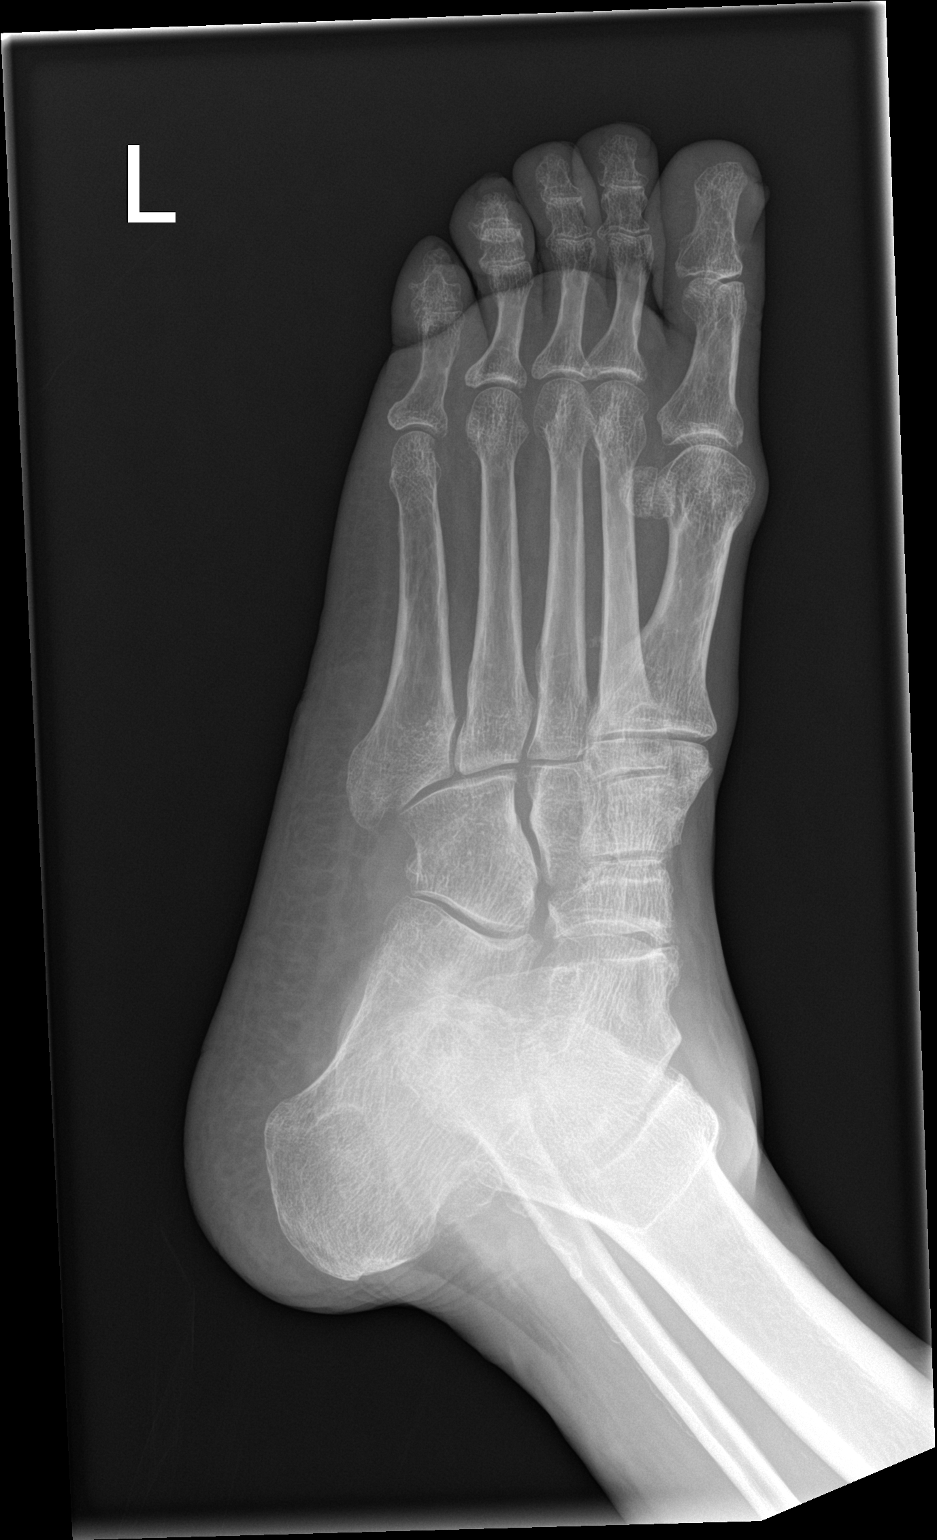
[im 3/3]
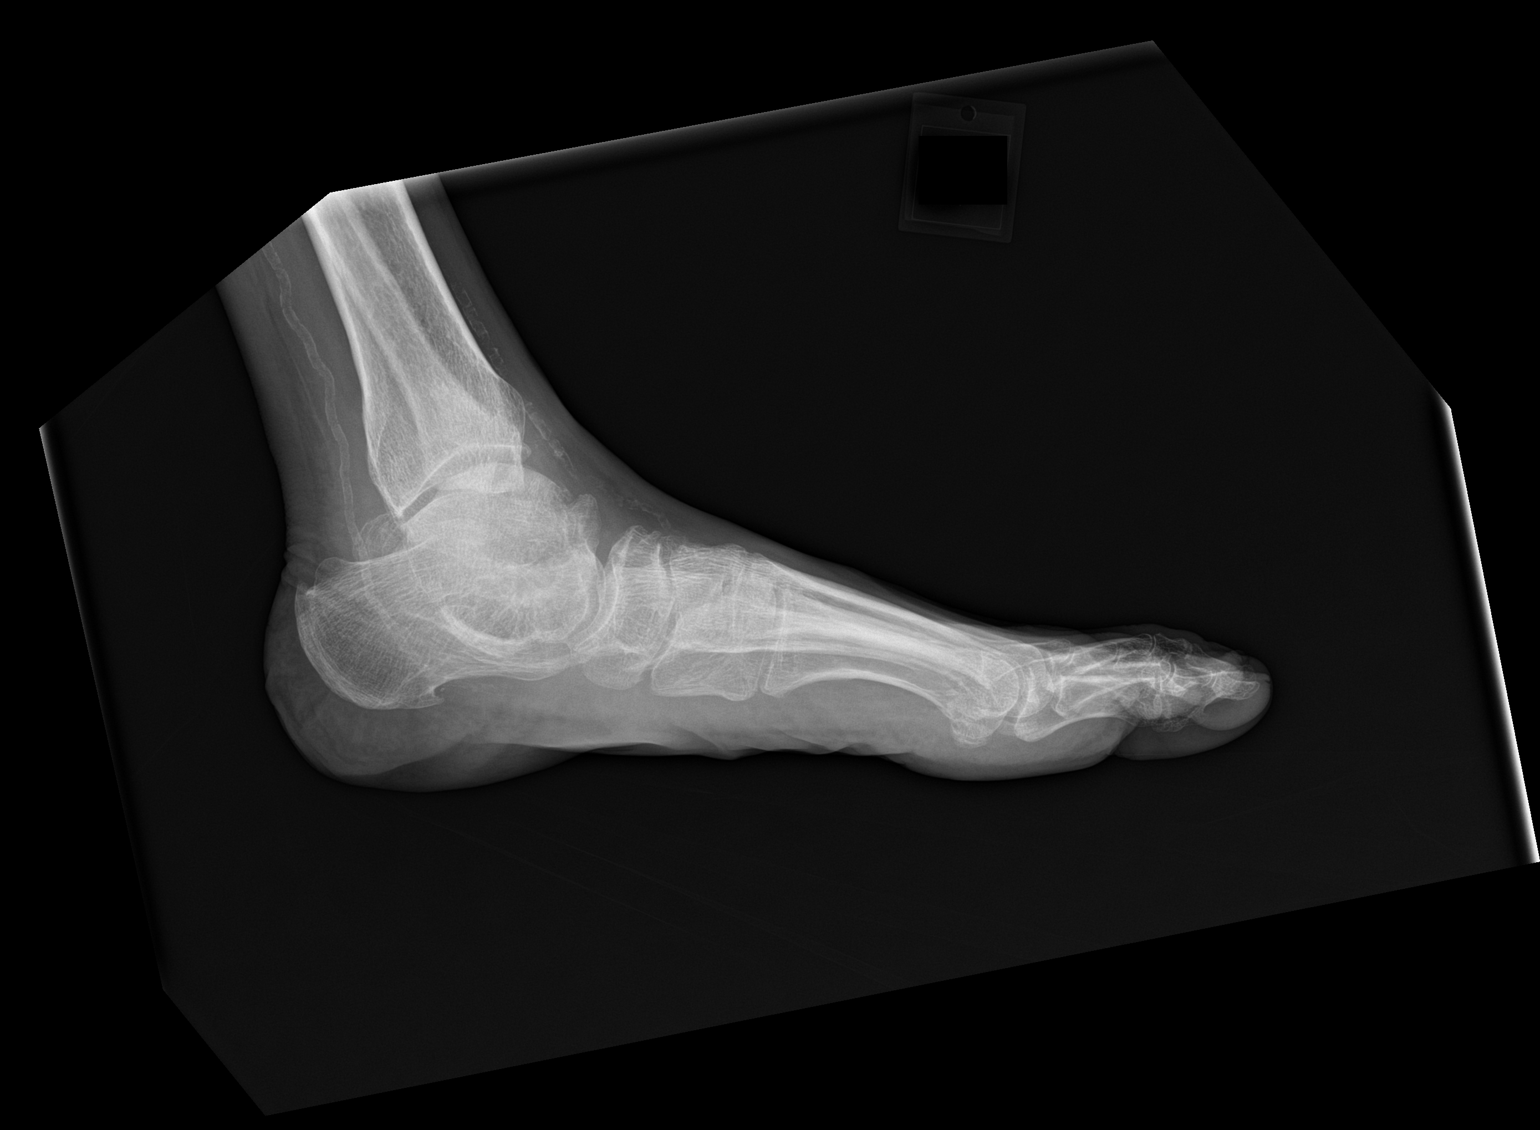

[3 of 3 positions shown; findings below may reference images not displayed]

FINDINGS: The bones appear diffusely demineralized. There is no evidence of
acute fracture dislocation or bone destruction. There is a pes
planus deformity with poor definition of the subtalar joint and
dorsal talar beaking, suspicious for underlying tarsal coalition.
Mild additional degenerative changes are present in the midfoot and
1st MTP joint. No soft tissue wound identified. There are prominent
vascular calcifications suggesting underlying diabetes.
IMPRESSION: 1. No acute osseous findings or radiographic evidence of
osteomyelitis.
2. Pes planus deformity with suspected underlying subtalar
coalition.

## 2021-01-27 IMAGING — CR DG FINGER LITTLE 2+V*L*
1 series · 3 of 3 positions shown · non-contrast
Comparison: None.

CLINICAL DATA: Little finger pain since falling off of a ladder 20
days ago. History of diabetes. Pain/wound.

EXAM:
LEFT LITTLE FINGER 2+V

[Series 1: dg finger little left · 0.14mm/px · 3 of 3 slices shown]
[im 1/3]
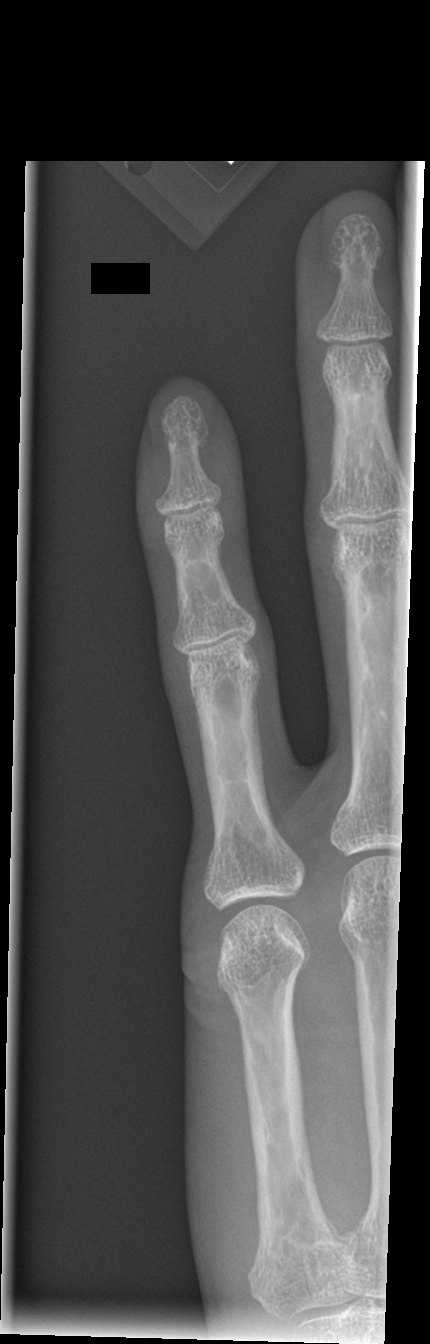
[im 2/3]
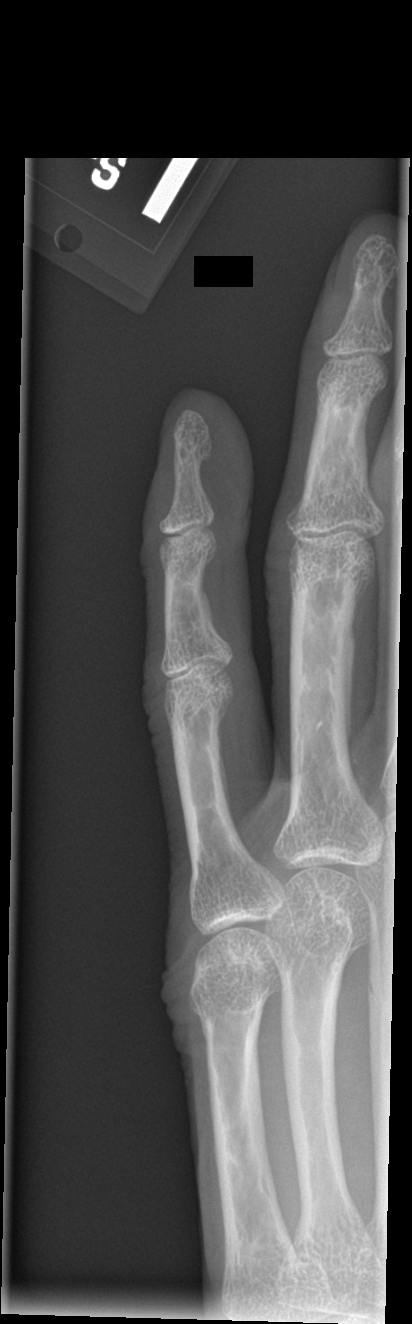
[im 3/3]
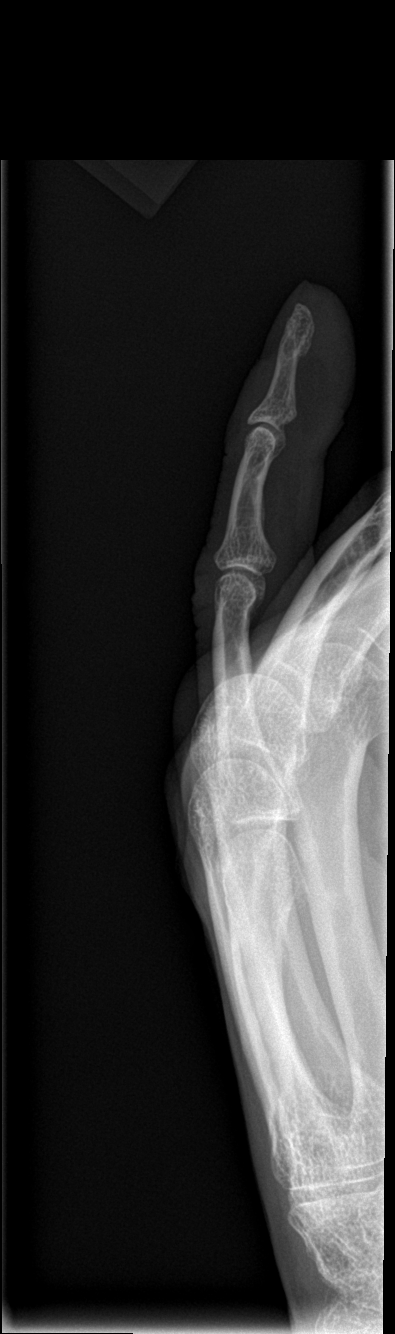

[3 of 3 positions shown; findings below may reference images not displayed]

FINDINGS: The bones appear demineralized. No evidence of acute fracture,
dislocation or bone destruction. The joint spaces are preserved. No
soft tissue wound, foreign body or soft tissue emphysema identified.
IMPRESSION: No acute osseous findings or radiographic evidence of osteomyelitis.

## 2021-01-27 IMAGING — MR MR FOOT*L* W/O CM
5 series · 38 of 40 positions shown · non-contrast
Comparison: Foot radiographs same date

CLINICAL DATA: Diabetic foot swelling, osteomyelitis suspected.
Medial and lateral foot wounds for the last month. Intermittent
drainage.

EXAM:
MRI OF THE LEFT FOOT WITHOUT CONTRAST
TECHNIQUE: Multiplanar, multisequence MR imaging of the left hindfoot was
performed. No intravenous contrast was administered.

[Series 5: PD fat-sat · axial · left · 3.0mm · 0.50mm/px · z∈[-63,+76]mm · 9 of 36 slices shown]
[im 1/36]
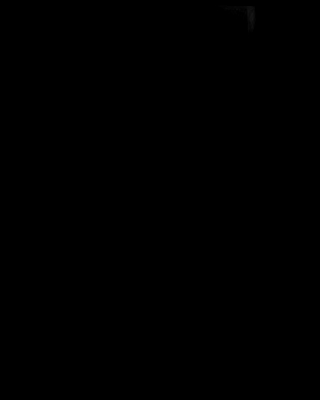
[im 5/36]
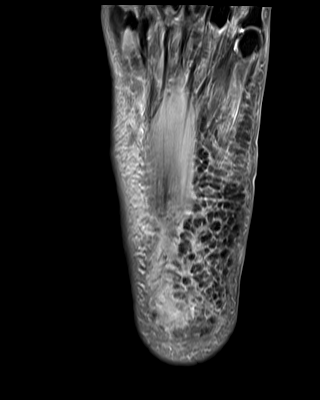
[im 9/36]
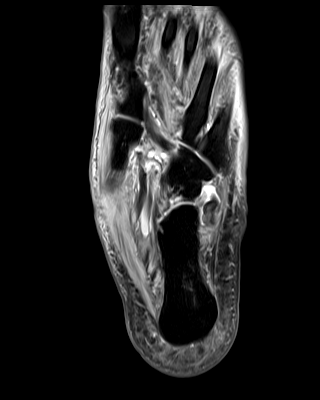
[im 14/36]
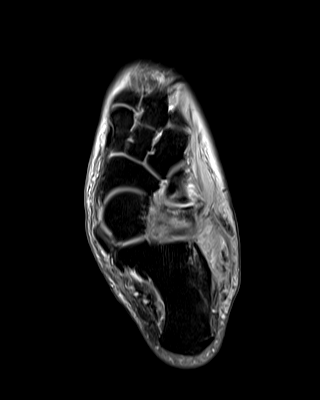
[im 18/36]
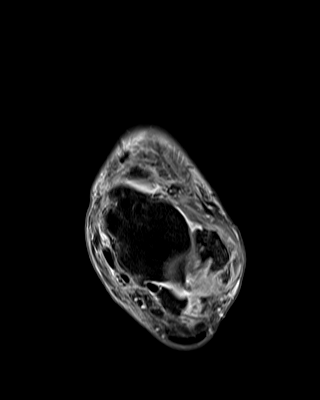
[im 22/36]
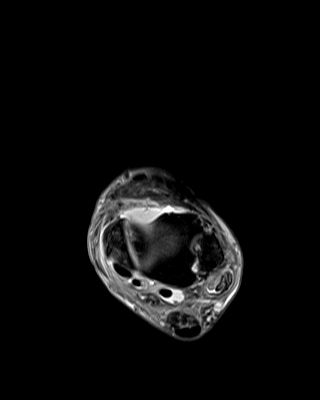
[im 27/36]
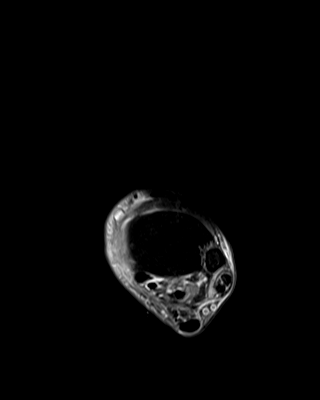
[im 31/36]
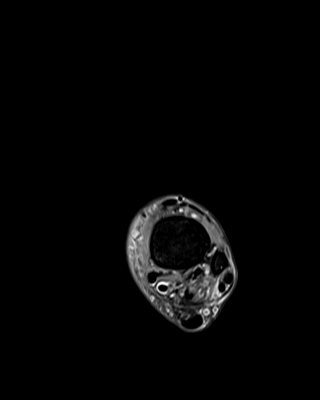
[im 36/36]
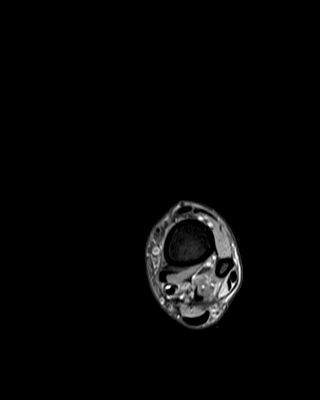

[Series 6: T2 fat-sat · axial · left · 3.0mm · 0.50mm/px · z∈[-63,+76]mm · 8 of 36 slices shown]
[im 1/36]
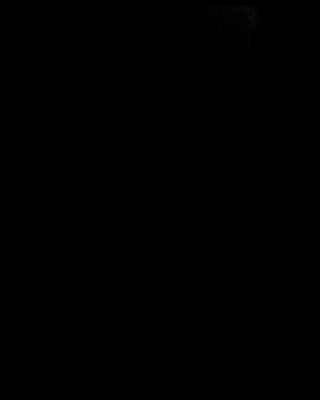
[im 6/36]
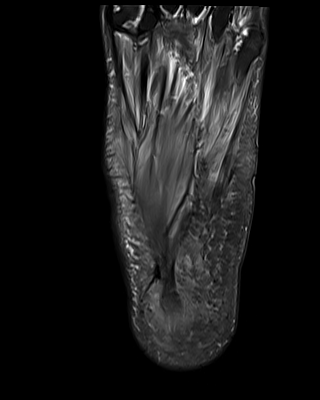
[im 11/36]
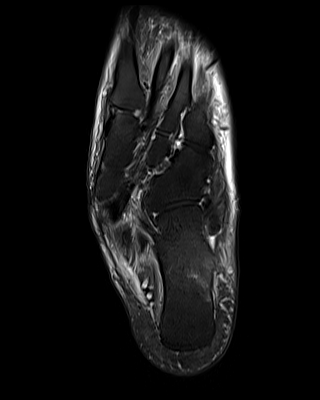
[im 16/36]
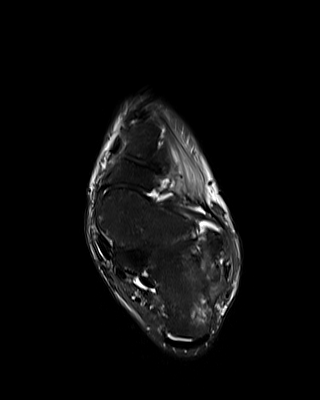
[im 21/36]
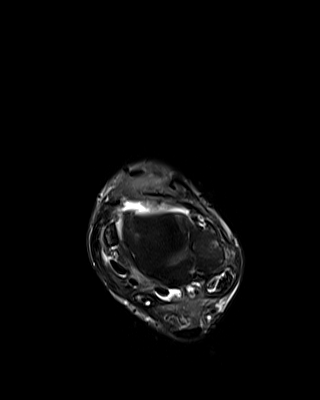
[im 26/36]
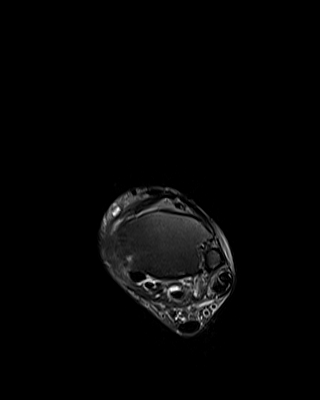
[im 31/36]
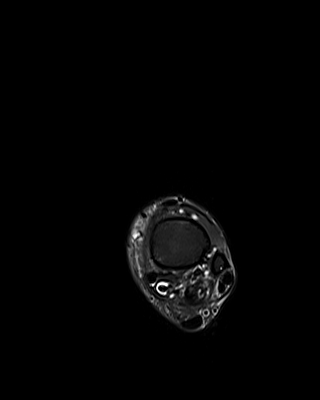
[im 36/36]
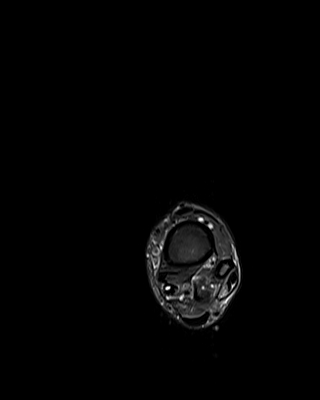

[Series 8: T2 · coronal · left · 3.0mm · 0.62mm/px · 11 of 50 slices shown]
[im 1/50]
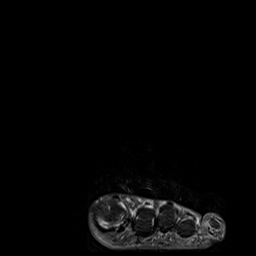
[im 5/50]
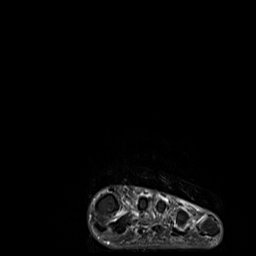
[im 10/50]
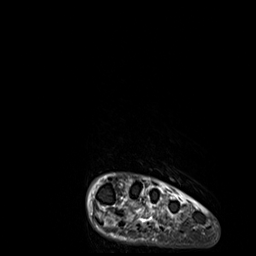
[im 15/50]
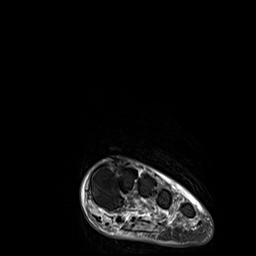
[im 20/50]
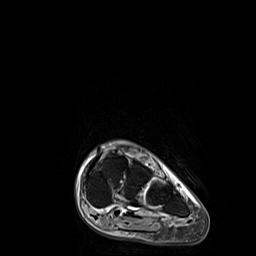
[im 25/50]
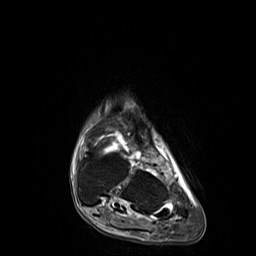
[im 30/50]
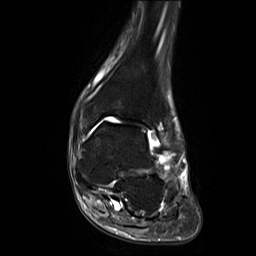
[im 35/50]
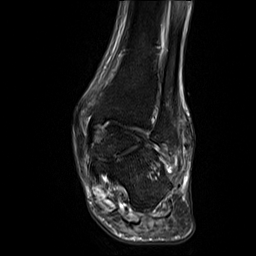
[im 40/50]
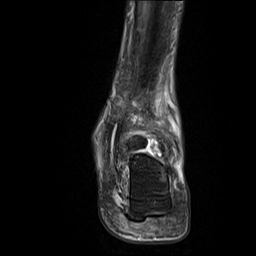
[im 45/50]
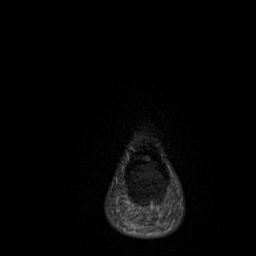
[im 50/50]
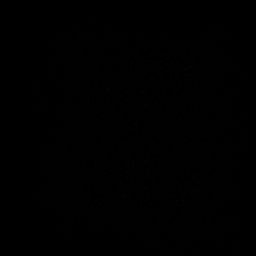

[Series 9: STIR · sagittal · left · 4.0mm · 0.35mm/px · 4 of 29 slices shown]
[im 1/29]
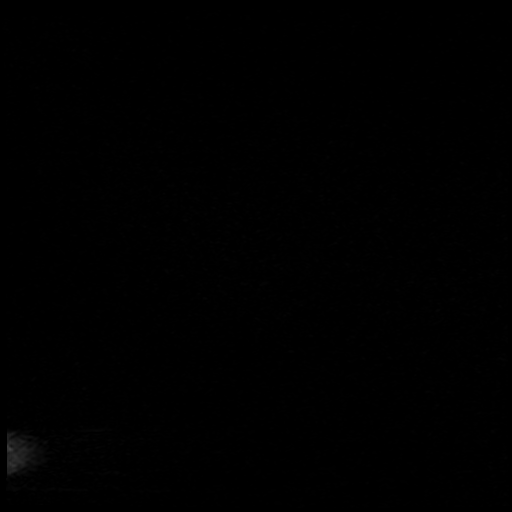
[im 6/29]
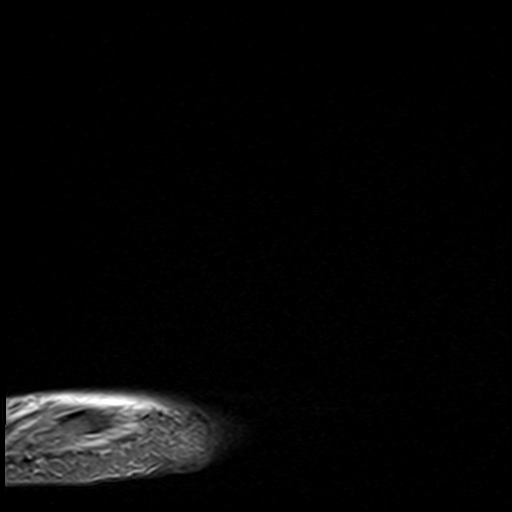
[im 12/29]
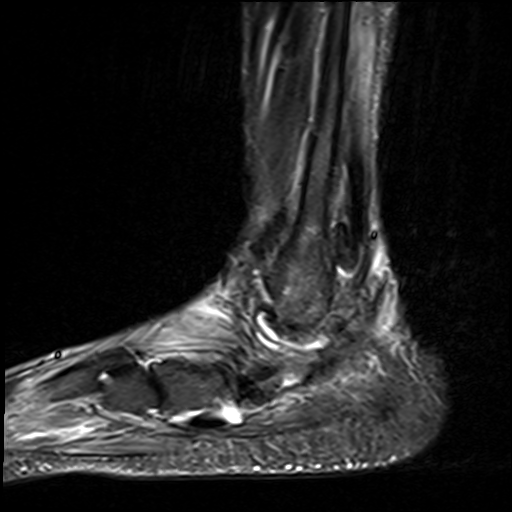
[im 17/29]
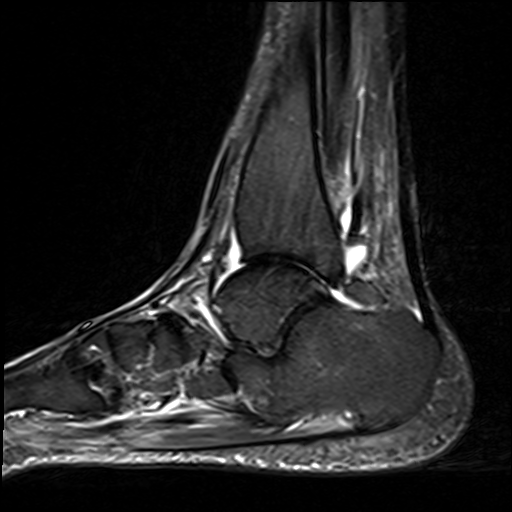

[Series 10: T1 · sagittal · left · 4.0mm · 0.70mm/px · 6 of 29 slices shown]
[im 1/29]
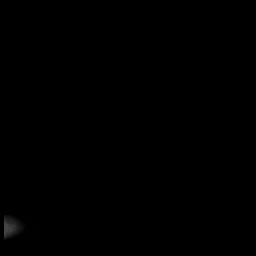
[im 6/29]
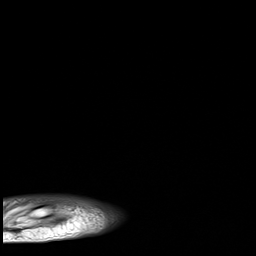
[im 12/29]
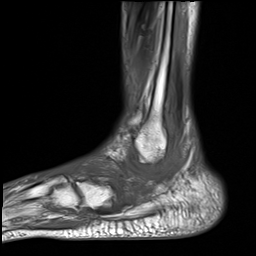
[im 17/29]
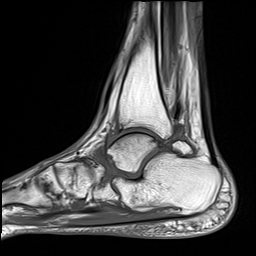
[im 23/29]
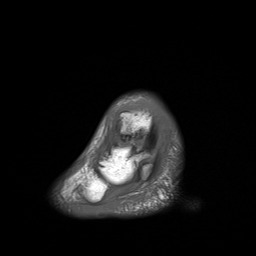
[im 29/29]
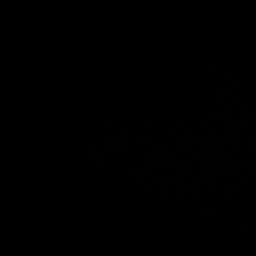

[38 of 40 positions shown; findings below may reference images not displayed]

FINDINGS: TENDONS

Peroneal: Chronic tendinosis and partial tearing of the peroneal
tendons which appear laterally subluxed from the retromalleolar
groove. There is soft tissue thickening posterior and inferior to
the distal fibula suspicious for synovitis.

Posteromedial: Intact and normally positioned. There is a small
amount of fluid within the posterior tibialis and flexor hallucis
longus tendons.

Anterior: Intact and normally positioned.

Achilles: Intact.

Plantar Fascia: Intact.

LIGAMENTS

Lateral: The anterior and posterior talofibular and calcaneofibular
ligaments are intact.The inferior tibiofibular ligaments appear
intact.

Medial: The deltoid and visualized portions of the spring ligament
appear intact.

CARTILAGE AND BONES

Ankle Joint: Small ankle joint effusion with mild tibiotalar
degenerative changes. The talar dome and tibial plafond appear
normal.

Subtalar Joints/Sinus Tarsi: Lateral hindfoot valgus and pes planus
deformities with diffuse replacement of the tarsal sinus fat. The
subtalar joint appears normal. No definite tarsal coalition. The sub
fibular space is narrowed with inflammatory changes surrounding the
laterally subluxed peroneal tendons.

Bones: Mild degenerative changes at the calcaneocuboid joint. The
alignment is normal at the Lisfranc joint. Mild spurring of both
malleoli. No acute osseous findings or evidence of osteomyelitis.

Other: Apparent soft tissue thickening over the lateral malleolus
and peroneal tendons which may reflect synovitis or granulation
tissue. No well-defined ulcer is identified in this region. There is
no focal fluid collection.
IMPRESSION: 1. Hindfoot valgus deformity with lateral subluxation of the
peroneal tendons and surrounding synovitis or granulation tissue
consistent with lateral hindfoot impingement syndrome.
2. No focal fluid collection or specific evidence of osteomyelitis.
3. No evidence of tarsal coalition as questioned previously.
Degenerative changes as described.

## 2021-01-27 MED ORDER — INSULIN ASPART 100 UNIT/ML IJ SOLN
0.0000 [IU] | Freq: Every day | INTRAMUSCULAR | Status: DC
  Administered 2021-01-27: 5 [IU] via SUBCUTANEOUS
  Administered 2021-01-30: 4 [IU] via SUBCUTANEOUS
  Filled 2021-01-27 (×2): qty 1

## 2021-01-27 MED ORDER — ADULT MULTIVITAMIN W/MINERALS CH
1.0000 | ORAL_TABLET | Freq: Every day | ORAL | Status: DC
Start: 2021-01-27 — End: 2021-02-01
  Administered 2021-01-27 – 2021-02-01 (×5): 1 via ORAL
  Filled 2021-01-27 (×5): qty 1

## 2021-01-27 MED ORDER — PIPERACILLIN-TAZOBACTAM 3.375 G IVPB
3.3750 g | Freq: Once | INTRAVENOUS | Status: AC
  Administered 2021-01-27: 3.375 g via INTRAVENOUS
  Filled 2021-01-27: qty 50

## 2021-01-27 MED ORDER — LINAGLIPTIN 5 MG PO TABS
5.0000 mg | ORAL_TABLET | Freq: Every day | ORAL | Status: DC
  Administered 2021-01-28 – 2021-02-01 (×5): 5 mg via ORAL
  Filled 2021-01-27 (×5): qty 1

## 2021-01-27 MED ORDER — INSULIN ASPART 100 UNIT/ML IJ SOLN
0.0000 [IU] | Freq: Three times a day (TID) | INTRAMUSCULAR | Status: DC
  Administered 2021-01-27: 9 [IU] via SUBCUTANEOUS
  Administered 2021-01-28: 3 [IU] via SUBCUTANEOUS
  Administered 2021-01-28 (×2): 7 [IU] via SUBCUTANEOUS
  Administered 2021-01-29: 5 [IU] via SUBCUTANEOUS
  Administered 2021-01-29: 1 [IU] via SUBCUTANEOUS
  Administered 2021-01-30: 3 [IU] via SUBCUTANEOUS
  Administered 2021-01-30 (×2): 5 [IU] via SUBCUTANEOUS
  Administered 2021-01-31: 12:00:00 2 [IU] via SUBCUTANEOUS
  Administered 2021-01-31: 17:00:00 1 [IU] via SUBCUTANEOUS
  Administered 2021-01-31: 08:00:00 5 [IU] via SUBCUTANEOUS
  Administered 2021-02-01 (×2): 7 [IU] via SUBCUTANEOUS
  Filled 2021-01-27 (×14): qty 1

## 2021-01-27 MED ORDER — INSULIN ASPART 100 UNIT/ML IJ SOLN
8.0000 [IU] | Freq: Once | INTRAMUSCULAR | Status: AC
  Administered 2021-01-27: 8 [IU] via INTRAVENOUS
  Filled 2021-01-27: qty 1

## 2021-01-27 MED ORDER — SODIUM CHLORIDE 0.9 % IV SOLN
2.0000 g | Freq: Three times a day (TID) | INTRAVENOUS | Status: DC
  Administered 2021-01-27 – 2021-01-30 (×8): 2 g via INTRAVENOUS
  Filled 2021-01-27 (×9): qty 2

## 2021-01-27 MED ORDER — ONDANSETRON HCL 4 MG PO TABS: 4.0000 mg | ORAL_TABLET | Freq: Four times a day (QID) | ORAL | Status: DC | PRN

## 2021-01-27 MED ORDER — VANCOMYCIN HCL 750 MG/150ML IV SOLN
750.0000 mg | Freq: Two times a day (BID) | INTRAVENOUS | Status: DC
  Administered 2021-01-28 – 2021-01-30 (×5): 750 mg via INTRAVENOUS
  Filled 2021-01-27 (×7): qty 150

## 2021-01-27 MED ORDER — ACETAMINOPHEN 325 MG PO TABS
650.0000 mg | ORAL_TABLET | Freq: Four times a day (QID) | ORAL | Status: DC | PRN
  Administered 2021-01-27: 650 mg via ORAL
  Filled 2021-01-27: qty 2

## 2021-01-27 MED ORDER — ENOXAPARIN SODIUM 40 MG/0.4ML IJ SOSY
40.0000 mg | PREFILLED_SYRINGE | Freq: Every day | INTRAMUSCULAR | Status: DC
  Administered 2021-01-28 – 2021-01-31 (×5): 40 mg via SUBCUTANEOUS
  Filled 2021-01-27 (×5): qty 0.4

## 2021-01-27 MED ORDER — LACTATED RINGERS IV BOLUS
1000.0000 mL | Freq: Once | INTRAVENOUS | Status: AC
  Administered 2021-01-27: 1000 mL via INTRAVENOUS

## 2021-01-27 MED ORDER — PIPERACILLIN-TAZOBACTAM 4.5 G IVPB
3.3750 g | Freq: Once | INTRAVENOUS | Status: DC
  Filled 2021-01-27: qty 100

## 2021-01-27 MED ORDER — ONDANSETRON HCL 4 MG/2ML IJ SOLN: 4.0000 mg | Freq: Four times a day (QID) | INTRAMUSCULAR | Status: DC | PRN

## 2021-01-27 MED ORDER — HYDROCODONE-ACETAMINOPHEN 5-325 MG PO TABS
1.0000 | ORAL_TABLET | ORAL | Status: DC | PRN
  Administered 2021-01-29 – 2021-01-31 (×2): 2 via ORAL
  Administered 2021-02-01: 08:00:00 1 via ORAL
  Filled 2021-01-27 (×2): qty 2
  Filled 2021-01-27: qty 1

## 2021-01-27 MED ORDER — ACETAMINOPHEN 650 MG RE SUPP
650.0000 mg | Freq: Four times a day (QID) | RECTAL | Status: DC | PRN
  Filled 2021-01-27: qty 1

## 2021-01-27 MED ORDER — METFORMIN HCL ER 500 MG PO TB24
500.0000 mg | ORAL_TABLET | Freq: Two times a day (BID) | ORAL | Status: DC
  Administered 2021-01-27 – 2021-02-01 (×9): 500 mg via ORAL
  Filled 2021-01-27 (×12): qty 1

## 2021-01-27 MED ORDER — VANCOMYCIN HCL IN DEXTROSE 1-5 GM/200ML-% IV SOLN
1000.0000 mg | Freq: Once | INTRAVENOUS | Status: AC
  Administered 2021-01-27: 1000 mg via INTRAVENOUS
  Filled 2021-01-27: qty 200

## 2021-01-27 MED ORDER — MORPHINE SULFATE (PF) 2 MG/ML IV SOLN: 2.0000 mg | INTRAVENOUS | Status: DC | PRN

## 2021-01-27 MED ORDER — INSULIN DETEMIR 100 UNIT/ML ~~LOC~~ SOLN
10.0000 [IU] | Freq: Every day | SUBCUTANEOUS | Status: DC
  Administered 2021-01-27: 10 [IU] via SUBCUTANEOUS
  Filled 2021-01-27 (×2): qty 0.1

## 2021-01-27 NOTE — ED Notes (Signed)
Resting on stretcher. Warm blanket given. Placed in hospital gown. Marland Kitchen

## 2021-01-27 NOTE — ED Triage Notes (Addendum)
Pt c/o left foot pain with open wound to the medial foot and another to the lateral side for the past 20 days. Family is with the pt. States he fell from the ladder at work when this started , pt also having left 5th finger pain from the fall. Pt states he fell twice yesterday due to dizziness,,

## 2021-01-27 NOTE — Consult Note (Signed)
Pharmacy Antibiotic Note  Cody Cowan is a 55 y.o. male admitted on 01/27/2021 with  wound infection .  Pharmacy has been consulted for cefepime and Vancomycin dosing.  Plan:  Cefepime 2gm every 8 hrs  Vancomycin 1000mg  loading dose followed by  Vancomycin 750 mg IV Q 12 hrs. Goal AUC 400-550. Expected AUC: 500 SCr used: 0.8 (actual Scr 0.68)  Height: 5\' 6"  (167.6 cm) Weight: 56.2 kg (124 lb) IBW/kg (Calculated) : 63.8  Temp (24hrs), Avg:98.1 F (36.7 C), Min:98 F (36.7 C), Max:98.2 F (36.8 C)  Recent Labs  Lab 01/27/21 1126  WBC 3.6*  CREATININE 0.68    Estimated Creatinine Clearance: 83.9 mL/min (by C-G formula based on SCr of 0.68 mg/dL).    No Known Allergies  Antimicrobials this admission: 11/23 Zosyn x1 11/23 Cefepime  >>  11/23 Vancomycin >>   Dose adjustments this admission: none  Microbiology results: 11/23 MRSA PCR: ordered  Thank you for allowing pharmacy to be a part of this patient's care.  Claudy Abdallah Rodriguez-Guzman PharmD, BCPS 01/27/2021 4:57 PM

## 2021-01-27 NOTE — H&P (Signed)
H&P:    Cody Cowan   KCL:[id number] DOB: [birth year] DOA: 01/27/2021  PCP: Patient, No Pcp Per (Inactive)  Chief Complaint: fall, pain, abnormal labs   History of Present Illness:    HPI: Cody Cowan is a 55 y.o. 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  The patient is Spanish-speaking only and history was obtained via in person interpreter.  The patient reports that he noticed wounds to the lateral portion and medial portion of his left foot for the past month.  Has been using over-the-counter violet colored spray on his foot to help with infection.  They have been increasing in size and has been having some intermittent drainage as well.  He also has some pain and numbness and pain in both feet at baseline.  States he has not been on any medication for diabetes including oral medication or insulin for the past couple of months due to not having insurance.  He also endorses prior alcohol use but has not had any alcohol in the past month.  He attempted to establish care with a primary care physician today but was told to come to the emergency department for further evaluation.  Apparently the patient also had a fall from a ladder at work approximately 3 weeks ago. He endorses polyuria, polydipsia and states he is lost approximately 60 pounds.  He also endorses occasional diarrhea.  ED Course: Lab work shows hyperglycemia, mild leukopenia with pseudohyponatremia and mildly elevated LFTs.  Does have some thrombocytopenia as well.  Labs also shows anemia and significantly elevated blood sugar at 527 with no anion gap or metabolic acidosis.  Left foot x-ray showed no acute findings.  Left pinky finger x-ray showed no acute findings.  He has been given 1 L of lactated Ringer's as a bolus in the emergency  department.  Has also been given vancomycin and Zosyn for antimicrobial therapy.  Has been also given 8 units of NovoLog IV.    ROS:   14 point review of systems is negative except for what is mentioned above in the HPI.   Past Medical History:   History reviewed. No pertinent past medical history.  Past Surgical History:   History reviewed. No pertinent surgical history.  Social History:   Social History   Socioeconomic History   Marital status: Single    Spouse name: Not on file   Number of children: Not on file   Years of education: Not on file   Highest education level: Not on file  Occupational History   Not on file  Tobacco Use   Smoking status: Never   Smokeless tobacco: Never  Substance and Sexual Activity   Alcohol use: Not Currently   Drug use: Not on file   Sexual activity: Not on file  Other Topics Concern   Not on file  Social History Narrative   Not on file   Social Determinants of Health   Financial Resource Strain: Not on file  Food Insecurity: Not on file  Transportation Needs: Not on file  Physical Activity: Not on file  Stress: Not on file  Social Connections: Not on file  Intimate Partner Violence: Not on file    Allergies:   No Known Allergies  Family History:   No family history on file.   Current Medications:   Prior to Admission medications   Medication Sig  Start Date End Date Taking? Authorizing Provider  Accu-Chek Softclix Lancets lancets Use as directed. 01/16/20   Pahwani, Michell Heinrich, MD  fenofibrate (TRICOR) 145 MG tablet TOMAR UNA TABLETA POR BOCA DIARIO CON DESAYUNO 01/16/20 09/03/20  Pahwani, Michell Heinrich, MD  fenofibrate 160 MG tablet Take 1 tablet (160 mg total) by mouth daily with breakfast. 01/17/20   Pahwani, Rinka R, MD  insulin NPH-regular Human (70-30) 100 UNIT/ML injection INYECTAR 5 UNIDADES EN EL PIEL DOS VECES AL DIA CON COMIDA 01/16/20 01/15/21  Pahwani, Michell Heinrich, MD  Multiple Vitamin (MULTIVITAMIN WITH MINERALS)  TABS tablet Take 1 tablet by mouth daily. 01/17/20   Pahwani, Michell Heinrich, MD  thiamine 100 MG tablet Take 1 tablet (100 mg total) by mouth daily. 01/17/20   Mckinley Jewel, MD     Physical Exam:   Vitals:   01/27/21 1034 01/27/21 1339  BP: 125/84 122/82  Pulse: 99 98  Resp: 16 18  Temp: 98 F (36.7 C) 98.2 F (36.8 C)  TempSrc: Oral Oral  SpO2: 99% 99%     General:  Appears calm and comfortable and is in NAD, thin appearing Cardiovascular:  RRR, no m/r/g.  Respiratory:   CTA bilaterally with no wheezes/rales/rhonchi.  Normal respiratory effort. Abdomen:  soft, NT, ND, NABS Skin:  as noted below Musculoskeletal:  grossly normal tone BUE/BLE, good ROM, no bony abnormality, left index finger partially amputated Lower extremity:  Ulcerations noted to lateral and medial portion of left foot with chronic deformity noted to left ankle.  Purulent drainage noted from ulceration to lateral portion of foot.  1+ DP pulses bilaterally.  Purple dye noted around wounds to left foot and left toes.  Ulceration noted on the left pinky as well Psychiatric:  grossly normal mood and affect, speech fluent and appropriate, AOx3 Neurologic:  CN 2-12 grossly intact, moves all extremities in coordinated fashion, sensation intact    Data Review:    Radiological Exams on Admission: Independently reviewed - see discussion in A/P where applicable  DG Finger Little Left  Result Date: 01/27/2021 CLINICAL DATA:  Little finger pain since falling off of a ladder 20 days ago. History of diabetes. Pain/wound. EXAM: LEFT LITTLE FINGER 2+V COMPARISON:  None. FINDINGS: The bones appear demineralized. No evidence of acute fracture, dislocation or bone destruction. The joint spaces are preserved. No soft tissue wound, foreign body or soft tissue emphysema identified. IMPRESSION: No acute osseous findings or radiographic evidence of osteomyelitis. Electronically Signed   By: Richardean Sale M.D.   On: 01/27/2021 11:13    DG Foot Complete Left  Result Date: 01/27/2021 CLINICAL DATA:  Left lateral foot pain since falling off of a ladder 20 days ago. Pain/wound. EXAM: LEFT FOOT - COMPLETE 3+ VIEW COMPARISON:  None. FINDINGS: The bones appear diffusely demineralized. There is no evidence of acute fracture dislocation or bone destruction. There is a pes planus deformity with poor definition of the subtalar joint and dorsal talar beaking, suspicious for underlying tarsal coalition. Mild additional degenerative changes are present in the midfoot and 1st MTP joint. No soft tissue wound identified. There are prominent vascular calcifications suggesting underlying diabetes. IMPRESSION: 1. No acute osseous findings or radiographic evidence of osteomyelitis. 2. 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 Electronically Signed   By: Richardean Sale M.D.   On: 01/27/2021 11:12    EKG: Independently reviewed.  NSR with rate 100   Labs on Admission: I have personally reviewed the available labs and imaging studies at  the time of the admission.  Pertinent labs on Admission: WBC 3.6, hemoglobin 11.8, hematocrit 34.4, platelets 106, sodium 126, chloride 92, blood glucose 527, AST 76, ALT 56, ALP 416, T bili 1.3     Assessment/Plan:    Poorly controlled diabetes mellitus type: Not on any oral medication or insulin therapy due to financial barriers.  TOC consulted and diabetes coordinator consulted.  No evidence of DKA on initial labs.  Given 8 units of IV NovoLog in the emergency department.  Start sliding scale insulin before meals and at bedtime.  Start Lantus 10 units nightly. Start metformin ER 500 twice daily. Start Tradjenta 5 mg daily.  Obtain A1c, TSH, Lipid panel.  Left foot/finger diabetic ulcers: The patient was given vancomycin and Zosyn in the emergency department.  We will continue with vancomycin.  Start cefepime.  Consult wound care nurse.  Obtain MRI of the left foot to rule out  osteomyelitis.  Diabetic neuropathy: Endorses numbness and pain in bilateral feet.  Start Lyrica.  Elevated LFTs: Mild.  Obtain hepatitis serologies.  Obtain right upper quadrant ultrasound.   Other information:    Level of Care: MedSurg DVT prophylaxis: Lovenox Code Status: Full code Consults: TOC and wound care nurse Admission status: Observation   Leslee Home DO [facility]   How to contact the [facility] Attending or Consulting provider [place] or covering provider during after hours [place], for this patient?  Check the care team in [facility] and look for a) attending/consulting [facility] provider listed and b) the [facility] team listed Log into [email or web address] and use [place]'s universal password to access. If you do not have the password, please contact the hospital operator. Locate the [facility] provider you are looking for under [facility] and page to a number that you can be directly reached. If you still have difficulty reaching the provider, please page the [facility] (Director on Call) for the Hospitalists listed on amion for assistance.   01/27/2021, 2:36 PM

## 2021-01-27 NOTE — ED Notes (Signed)
Unable to speak english. Interpreter requested.

## 2021-01-27 NOTE — ED Provider Notes (Signed)
[facility] Emergency Department Provider Note   ____________________________________________   Event Date/Time   First MD Initiated Contact with Patient 01/27/21 1229     (approximate)  I have reviewed the triage vital signs and the nursing notes.   HISTORY  Chief Complaint Wound Infection    HPI Deloyd Pj Zehner is a 55 y.o. male with possible history of diabetes and alcohol abuse who presents to the ED complaining of wound infection.  History is limited as patient is Spanish-speaking only and history obtained via in person interpreter.  Patient reports that he has noticed wounds to the lateral portion and medial portion of his left foot for about the past month.  They have been increasing in size since he first noticed them and he has begun to notice drainage from the lateral portion of his foot.  He has some mild pain in his foot but states he has significant numbness in both feet at baseline.  He denies any recent trauma to his left foot, does report a old injury to his left ankle about 2 years ago that he did not have surgery for.  He reports being off any medication for his diabetes for at least the past couple of months, was previously prescribed insulin.  He admits to prior alcohol abuse, but states he has not had any alcohol in about the past month.  He attempted to establish care with a PCP today but was referred to the ED for further evaluation.  He reports he has been using over-the-counter violet colored spray on his foot to help with the infection.        History reviewed. No pertinent past medical history.  Patient Active Problem List   Diagnosis Date Noted   Hyperglycemia due to diabetes mellitus (HCC) 01/27/2021   Alcohol abuse with intoxication (HCC) 01/12/2020   Hyperglycemia 01/12/2020   Hyponatremia 01/12/2020   Pancreatitis, acute 01/12/2020    History reviewed. No pertinent surgical history.  Prior to Admission  medications   Medication Sig Start Date End Date Taking? Authorizing Provider  Accu-Chek Softclix Lancets lancets Use as directed. 01/16/20   Pahwani, Kasandra Knudsen, MD  fenofibrate (TRICOR) 145 MG tablet TOMAR UNA TABLETA POR BOCA DIARIO CON DESAYUNO 01/16/20 09/03/20  Pahwani, Kasandra Knudsen, MD  fenofibrate 160 MG tablet Take 1 tablet (160 mg total) by mouth daily with breakfast. 01/17/20   Pahwani, Rinka R, MD  insulin NPH-regular Human (70-30) 100 UNIT/ML injection INYECTAR 5 UNIDADES EN EL PIEL DOS VECES AL DIA CON COMIDA 01/16/20 01/15/21  Pahwani, Kasandra Knudsen, MD  Multiple Vitamin (MULTIVITAMIN WITH MINERALS) TABS tablet Take 1 tablet by mouth daily. 01/17/20   Pahwani, Kasandra Knudsen, MD  thiamine 100 MG tablet Take 1 tablet (100 mg total) by mouth daily. 01/17/20   Pahwani, Kasandra Knudsen, MD    Allergies Patient has no known allergies.  No family history on file.  Social History Social History   Tobacco Use   Smoking status: Never   Smokeless tobacco: Never  Substance Use Topics   Alcohol use: Not Currently    Review of Systems  Constitutional: No fever/chills Eyes: No visual changes. ENT: No sore throat. Cardiovascular: Denies chest pain. Respiratory: Denies shortness of breath. Gastrointestinal: No abdominal pain.  No nausea, no vomiting.  No diarrhea.  No constipation. Genitourinary: Negative for dysuria. Musculoskeletal: Negative for back pain. Skin: Negative for rash.  Positive for foot wound. Neurological: Negative for headaches, focal weakness or numbness.  ____________________________________________  PHYSICAL EXAM:  VITAL SIGNS: ED Triage Vitals [01/27/21 1034]  Enc Vitals Group     BP 125/84     Pulse Rate 99     Resp 16     Temp 98 F (36.7 C)     Temp Source Oral     SpO2 99 %     Weight      Height      Head Circumference      Peak Flow      Pain Score 8     Pain Loc      Pain Edu?      Excl. in GC?     Constitutional: Alert and oriented.  Thin and cachectic  appearing. Eyes: Conjunctivae are normal. Head: Atraumatic. Nose: No congestion/rhinnorhea. Mouth/Throat: Mucous membranes are moist. Neck: Normal ROM Cardiovascular: Normal rate, regular rhythm. Grossly normal heart sounds. Respiratory: Normal respiratory effort.  No retractions. Lungs CTAB. Gastrointestinal: Soft and nontender. No distention. Genitourinary: deferred Musculoskeletal: Ulcerations noted to lateral and medial portion of left foot with chronic deformity noted to left ankle.  Purulent drainage noted from ulceration to lateral portion of foot.  1+ DP pulses bilaterally.  Purple dye noted around wounds to left foot and left toes. Neurologic:  Normal speech and language. No gross focal neurologic deficits are appreciated. Skin:  Skin is warm, dry and intact. No rash noted. Psychiatric: Mood and affect are normal. Speech and behavior are normal.  ____________________________________________   LABS (all labs ordered are listed, but only abnormal results are displayed)  Labs Reviewed  CBC WITH DIFFERENTIAL/PLATELET - Abnormal; Notable for the following components:      Result Value   WBC 3.6 (*)    RBC 3.45 (*)    Hemoglobin 11.8 (*)    HCT 34.4 (*)    MCH 34.2 (*)    Platelets 106 (*)    All other components within normal limits  COMPREHENSIVE METABOLIC PANEL - Abnormal; Notable for the following components:   Sodium 126 (*)    Chloride 92 (*)    Glucose, Bld 527 (*)    Albumin 3.3 (*)    AST 76 (*)    ALT 56 (*)    Alkaline Phosphatase 416 (*)    Total Bilirubin 1.3 (*)    All other components within normal limits  RESP PANEL BY RT-PCR (FLU A&B, COVID) [id number]    PROCEDURES  Procedure(s) performed (including Critical Care):  Procedures   ____________________________________________   INITIAL IMPRESSION / ASSESSMENT AND PLAN / ED COURSE      55 year old male with past medical history of diabetes and alcohol abuse who presents to the ED complaining of  wounds to his left foot that have been increasing in size and developing purulent drainage over the past month.  Vital signs are reassuring and not consistent with sepsis, however wounds do appear infected and I am concerned for osteomyelitis.  X-ray reviewed by me and shows no acute fracture or dislocation, does show chronic left ankle injury.  We will start patient on broad-spectrum antibiotics, labs also remarkable for uncontrolled diabetes.  No evidence of DKA or other electrolyte abnormality, we will hydrate with IV fluids and give IV insulin.  Case discussed with hospitalist for admission.      ____________________________________________   FINAL CLINICAL IMPRESSION(S) / ED DIAGNOSES  Final diagnoses:  Diabetic foot infection (HCC)  Uncontrolled type 2 diabetes mellitus with hyperglycemia [facility])     ED Discharge Orders     None  Note:  This document was prepared using Dragon voice recognition software and may include unintentional dictation errors.    Chesley Noon, MD 01/27/21 [id number]

## 2021-01-27 NOTE — ED Notes (Signed)
When ask if having pain he states yes and points to left foot. Tylenol 650mg  po given. ED oked for him to have something to eat and drink. Diet coke and peanut butter cracker fixed for patient.

## 2021-01-28 ENCOUNTER — Observation Stay: Payer: Self-pay

## 2021-01-28 LAB — GLUCOSE, CAPILLARY
Glucose-Capillary: 309 mg/dL — ABNORMAL HIGH (ref 70–99)
Glucose-Capillary: 181 mg/dL — ABNORMAL HIGH (ref 70–99)

## 2021-01-28 LAB — COMPREHENSIVE METABOLIC PANEL
ALT: 42 U/L (ref 0–44)
AST: 54 U/L — ABNORMAL HIGH (ref 15–41)
Albumin: 2.8 g/dL — ABNORMAL LOW (ref 3.5–5.0)
Alkaline Phosphatase: 274 U/L — ABNORMAL HIGH (ref 38–126)
Anion gap: 5 (ref 5–15)
BUN: 11 mg/dL (ref 6–20)
CO2: 28 mmol/L (ref 22–32)
Calcium: 8.8 mg/dL — ABNORMAL LOW (ref 8.9–10.3)
Chloride: 97 mmol/L — ABNORMAL LOW (ref 98–111)
Creatinine, Ser: 0.53 mg/dL — ABNORMAL LOW (ref 0.61–1.24)
GFR, Estimated: >60 mL/min (ref >60)
Glucose, Bld: 217 mg/dL — ABNORMAL HIGH (ref 70–99)
Potassium: 3.6 mmol/L (ref 3.5–5.1)
Sodium: 130 mmol/L — ABNORMAL LOW (ref 135–145)
Total Bilirubin: 1.2 mg/dL (ref 0.3–1.2)
Total Protein: 6.5 g/dL (ref 6.5–8.1)

## 2021-01-28 LAB — HEPATITIS PANEL, ACUTE
HCV Ab: NONREACTIVE
Hep A IgM: NONREACTIVE
Hep B C IgM: NONREACTIVE
Hepatitis B Surface Ag: NONREACTIVE

## 2021-01-28 LAB — HEMOGLOBIN A1C
Hgb A1c MFr Bld: 10.2 % — ABNORMAL HIGH (ref 4.8–5.6)
Mean Plasma Glucose: 246.04 mg/dL

## 2021-01-28 LAB — CBG MONITORING, ED
Glucose-Capillary: 230 mg/dL — ABNORMAL HIGH (ref 70–99)
Glucose-Capillary: 325 mg/dL — ABNORMAL HIGH (ref 70–99)

## 2021-01-28 LAB — TSH: TSH: 9.501 u[IU]/mL — ABNORMAL HIGH (ref 0.350–4.500)

## 2021-01-28 LAB — CBC
HCT: 31.5 % — ABNORMAL LOW (ref 39.0–52.0)
Hemoglobin: 11.1 g/dL — ABNORMAL LOW (ref 13.0–17.0)
MCH: 34.6 pg — ABNORMAL HIGH (ref 26.0–34.0)
MCHC: 35.2 g/dL (ref 30.0–36.0)
MCV: 98.1 fL (ref 80.0–100.0)
Platelets: 105 10*3/uL — ABNORMAL LOW (ref 150–400)
RBC: 3.21 MIL/uL — ABNORMAL LOW (ref 4.22–5.81)
RDW: 12.7 % (ref 11.5–15.5)
WBC: 4.2 10*3/uL (ref 4.0–10.5)
nRBC: 0 % (ref 0.0–0.2)

## 2021-01-28 LAB — LIPID PANEL
Cholesterol: 345 mg/dL — ABNORMAL HIGH (ref 0–200)
HDL: 18 mg/dL — ABNORMAL LOW (ref >40)
LDL Cholesterol: UNDETERMINED mg/dL (ref 0–99)
Total CHOL/HDL Ratio: 19.2 RATIO
Triglycerides: 682 mg/dL — ABNORMAL HIGH (ref <150)
VLDL: UNDETERMINED mg/dL (ref 0–40)

## 2021-01-28 LAB — HIV ANTIBODY (ROUTINE TESTING W REFLEX): HIV Screen 4th Generation wRfx: NONREACTIVE

## 2021-01-28 LAB — LDL CHOLESTEROL, DIRECT: Direct LDL: 191 mg/dL — ABNORMAL HIGH (ref 0–99)

## 2021-01-28 IMAGING — US US ABDOMEN LIMITED
1 series · 14 of 25 positions shown · non-contrast
Comparison: Right upper quadrant ultrasound dated [DATE].

CLINICAL DATA: Transaminitis.  Denies abdominal pain.

EXAM:
ULTRASOUND ABDOMEN LIMITED RIGHT UPPER QUADRANT

[Series 1: us abdomen limited ruq (liver/gb) · 14 of 26 slices shown]
[im 1/26]
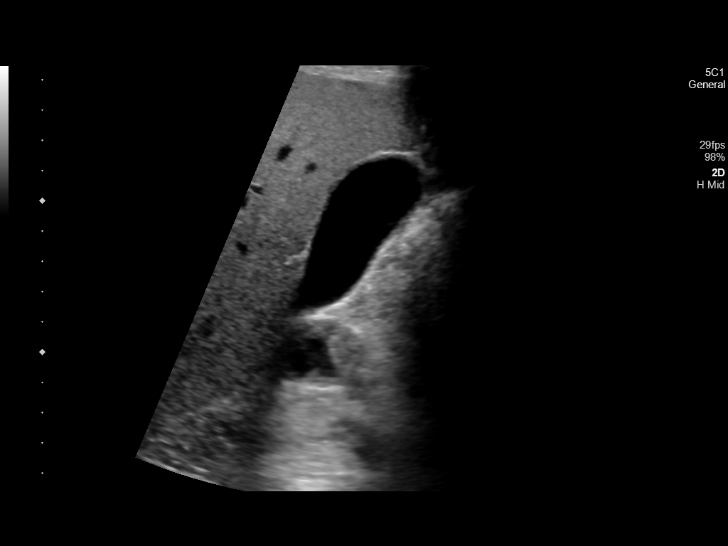
[im 3/26]
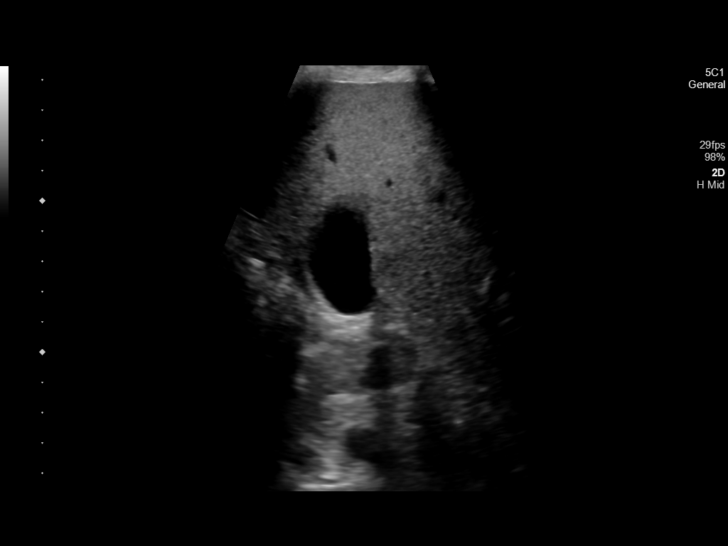
[im 5/26]
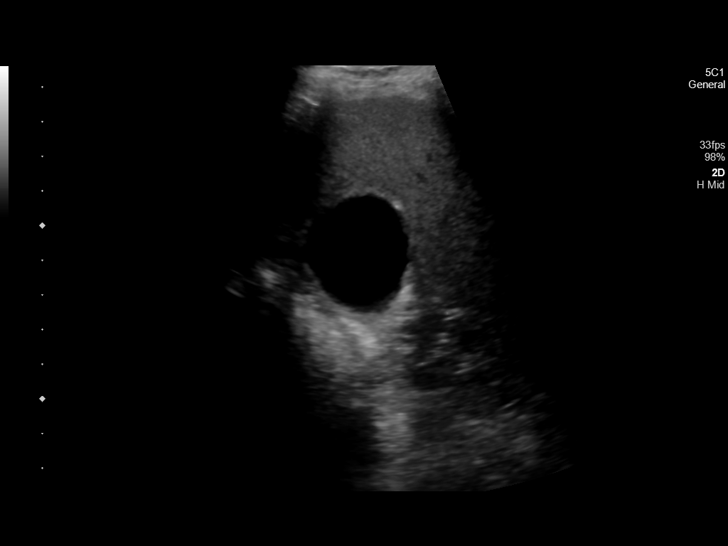
[im 7/26]
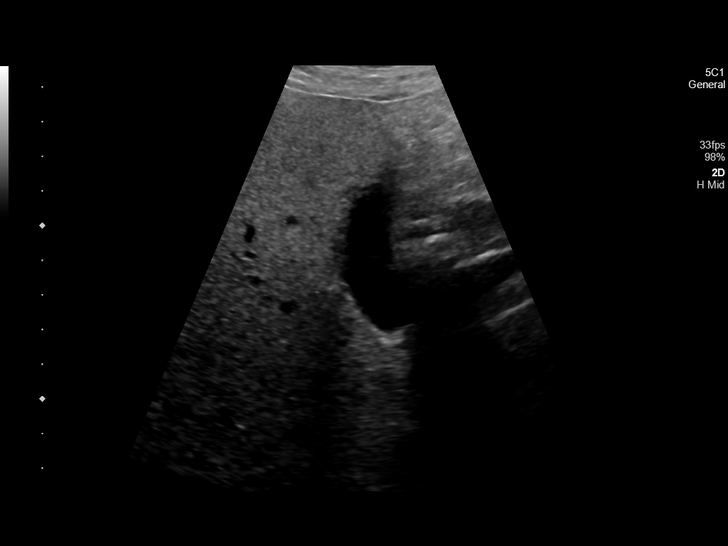
[im 9/26]
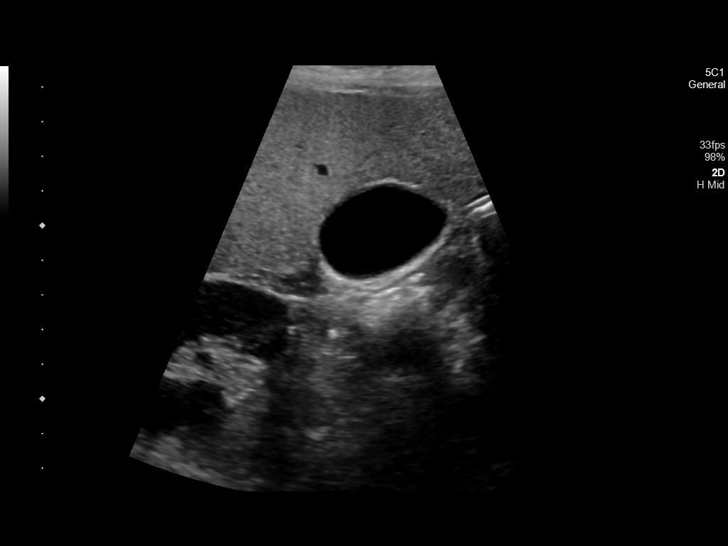
[im 10/26]
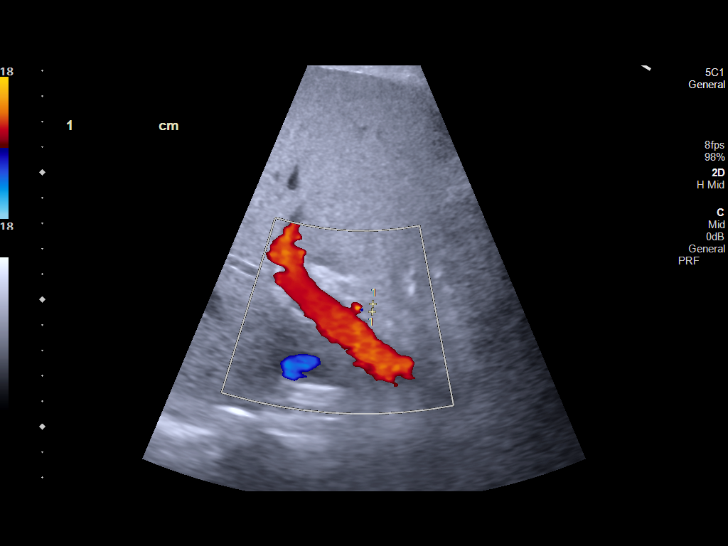
[im 12/26]
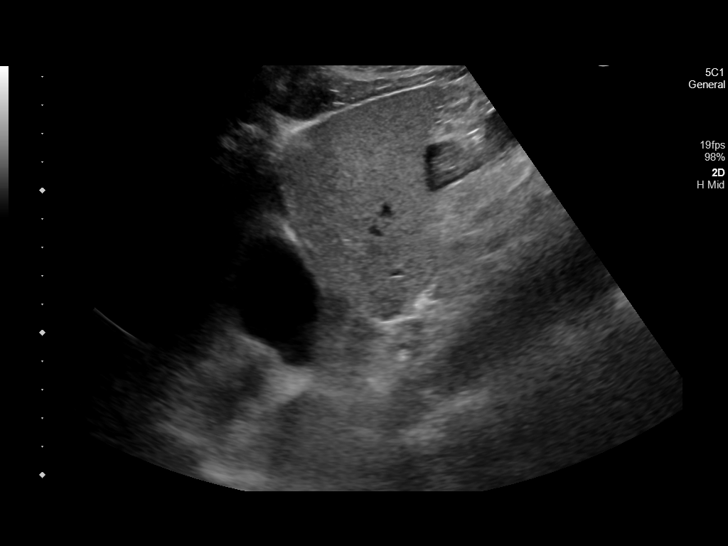
[im 14/26]
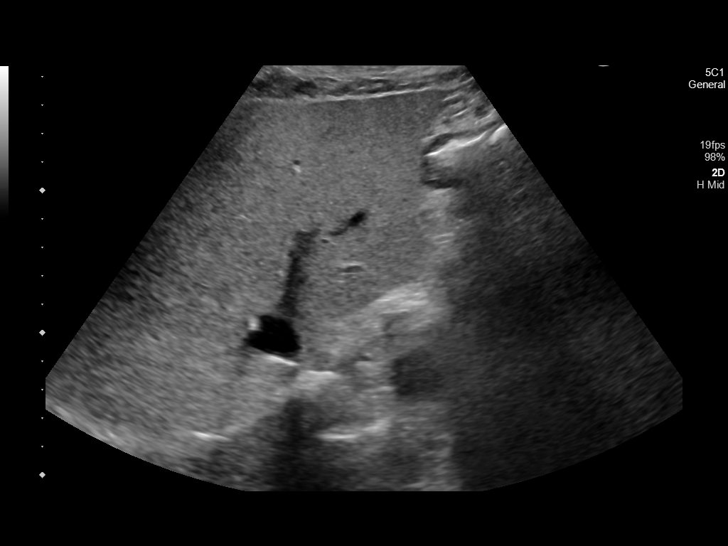
[im 16/26]
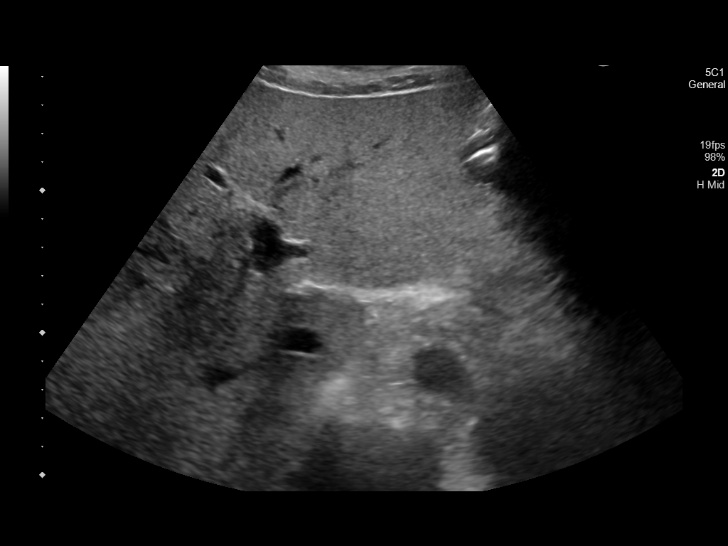
[im 17/26]
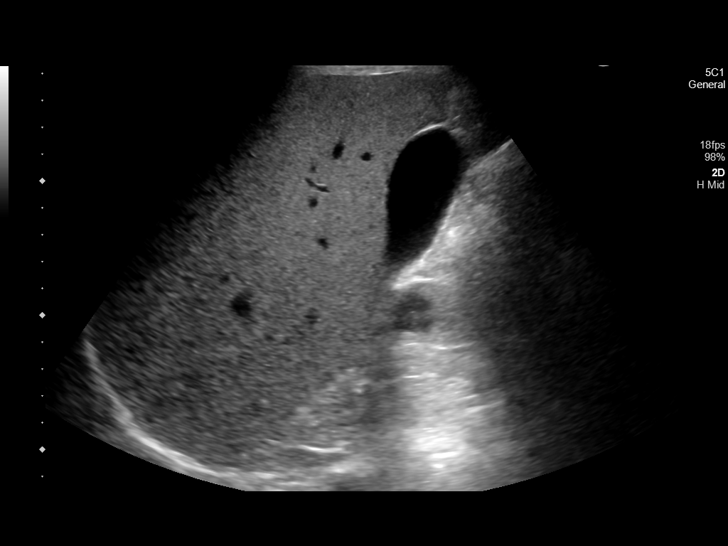
[im 19/26]
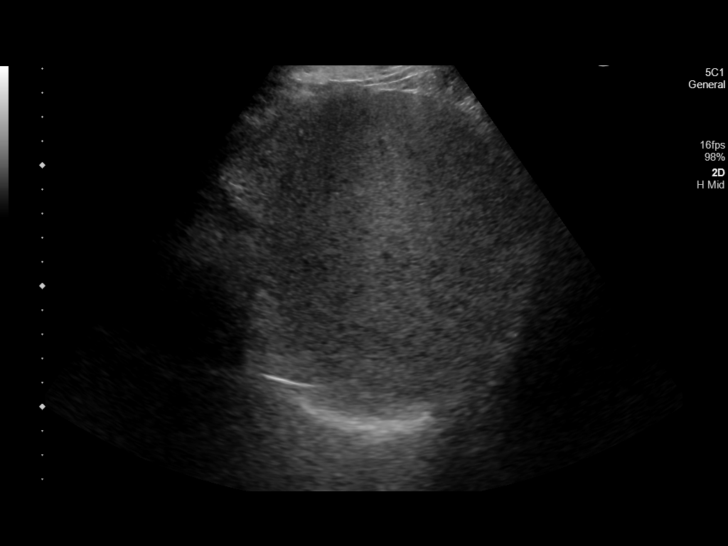
[im 21/26]
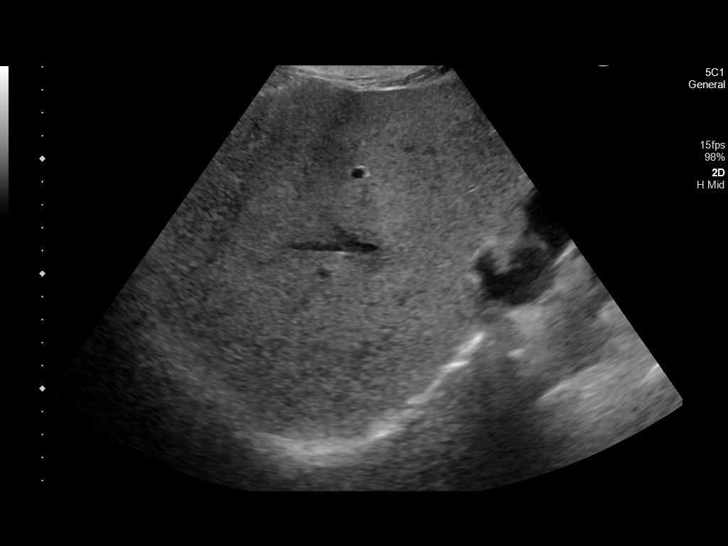
[im 23/26]
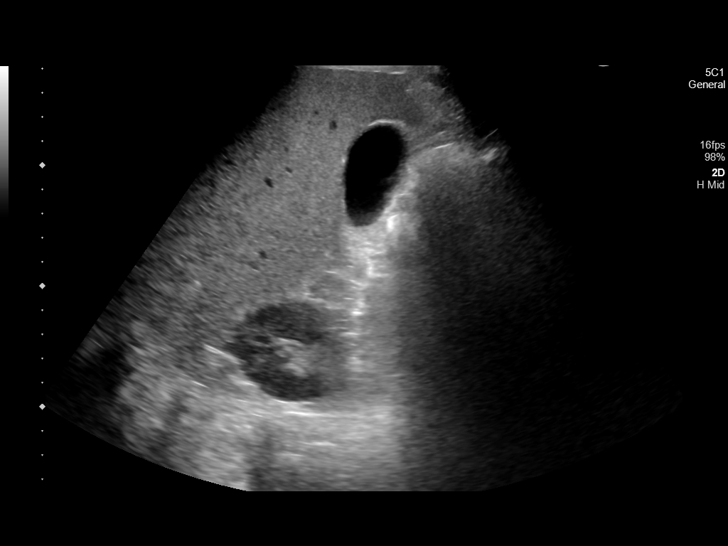
[im 26/26]
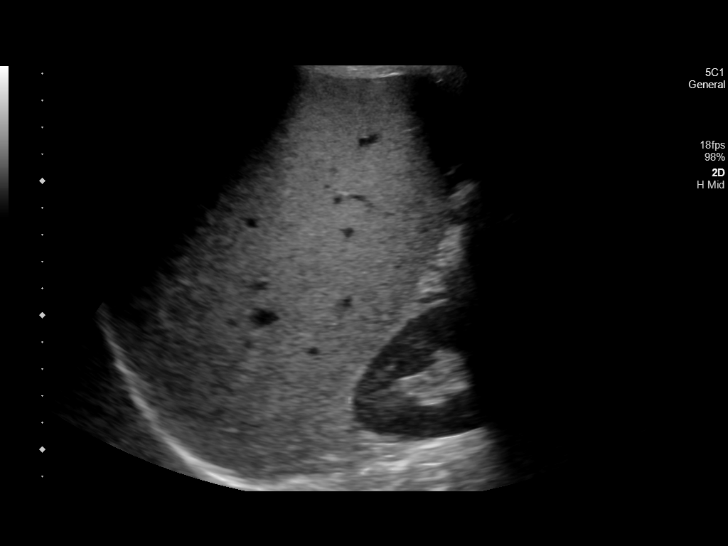

[14 of 25 positions shown; findings below may reference images not displayed]

FINDINGS: Gallbladder:

No gallstones or wall thickening visualized. No sonographic Murphy
sign noted by sonographer.

Common bile duct:

Diameter: 3 mm, normal.

Liver:

No focal lesion identified. Unchanged diffusely increased
parenchymal echogenicity. Portal vein is patent on color Doppler
imaging with normal direction of blood flow towards the liver.

Other: None.
IMPRESSION: 1. No acute abnormality.
2. Unchanged hepatic steatosis.

## 2021-01-28 MED ORDER — INFLUENZA VAC SPLIT QUAD 0.5 ML IM SUSY
0.5000 mL | PREFILLED_SYRINGE | INTRAMUSCULAR | Status: AC
  Administered 2021-01-29: 0.5 mL via INTRAMUSCULAR
  Filled 2021-01-28 (×2): qty 0.5

## 2021-01-28 MED ORDER — LIVING WELL WITH DIABETES BOOK - IN SPANISH
Freq: Once | Status: AC
  Filled 2021-01-28 (×2): qty 1

## 2021-01-28 MED ORDER — INSULIN ASPART PROT & ASPART (70-30 MIX) 100 UNIT/ML ~~LOC~~ SUSP
15.0000 [IU] | Freq: Two times a day (BID) | SUBCUTANEOUS | Status: DC
  Administered 2021-01-28: 15 [IU] via SUBCUTANEOUS
  Filled 2021-01-28: qty 10

## 2021-01-28 NOTE — Progress Notes (Addendum)
Inpatient Diabetes Program Recommendations  AACE/ADA: New Consensus Statement on Inpatient Glycemic Control   Target Ranges:  Prepandial:   less than 140 mg/dL      Peak postprandial:   less than 180 mg/dL (1-2 hours)      Critically ill patients:  140 - 180 mg/dL    Latest Reference Range & Units 01/28/21 08:54  Glucose-Capillary 70 - 99 mg/dL 230 (H)  (H): Data is abnormally high  Latest Reference Range & Units 01/27/21 16:22 01/27/21 22:15  Glucose-Capillary 70 - 99 mg/dL 383 (H) 416 (H)    Latest Reference Range & Units 01/27/21 11:26  CO2 22 - 32 mmol/L 27  Glucose 70 - 99 mg/dL 527 (HH)  Anion gap 5 - 15  7  (HH): Data is critically high  Latest Reference Range & Units 01/12/20 19:51  Hemoglobin A1C 4.8 - 5.6 % 11.2 (H)  (H): Data is abnormally high Review of Glycemic Control  Diabetes history:DM2 Outpatient Diabetes medications: 70/30 5 units BID Current orders for Inpatient glycemic control: Levemir 10 units QHS, Novolog 0-9 units TID wit meals, Novolog 0-5 units QHS, Tradjenta 5 units daily, Metformin XR 500 mg BID  Inpatient Diabetes Program Recommendations:    Insulin: Please consider increasing Levemir to 15 units QHS and adding Novolog 3 units TID with meals for meal coverage.  If provider prefers to change to 70/30 insulin regimen, would recommend discontinuing Levemir (and meal coverage if ordered) and order 70/30 11 units BID (provides 15.4 units for basal and 6.6 units for meal coverage per day).    Discharge DM medications: Would recommend discharging patient on Novolin 70/30 BID and Metformin BID. Novolin 70/30 is $25 per vial or $43 per box of 5 insulin pens and Metformin is on $4 list at [employer].   NOTE: Noted consult for Diabetes Coordinator. Diabetes Coordinator is not on campus over the long holiday weekend but available by pager from 8am to 5pm for questions or concerns. Per chart, patient had fall from ladder 3 weeks ago and admitted with poorly controlled  DM, left foot/finger ulcers, diabetic neuropathy, and elevated LFTs.  Noted patient was inpatient 01/12/20-01/16/20 and was discharged on 70/30 5 BID. Patient has no insurance and when he was here last admission, he [facility] at d/c and was to f/u with [facility]. Per telephone note 03/02/20, patient did not know address and [facility] would not be able to provide any other meds until patient provided address and financial information. Anticipate that patient will need medication assistance at discharge (TOC already consulted). Would recommend using affordable DM medications and patient will need to be on insulin. Therefore, would recommend prescribing Metformin which is on the $4 list at [facility]) and Novolin 70/30 which can be purchased at [facility] for $25 per vial or $43 for box of 5 insulin pens. Would not recommend continuing Tradjenta as it is very expensive (over $440 cash price).   Spoke with patient over the phone with interpreter [phone], Grayland Ormond). Patient states he has had DM for 10 years and he has taken insulin and pills for DM over the past 10 years. Patient states that he took pills for DM which he got from [place] and he last took pills about 1 year ago.  Patient states that he took 70/30 insulin (pens) after he was discharged from hospital last year in November. Patient states that the 70/30 insulin he was given lasted him about 3 months. Patient states that he  was not able to be seen at the clinic, "they did not want to see me". With further questioning, he states that he could not be seen because he did not have someone with him to translate. Discussed that in order to go to [facility] and [facility] he will need to provide address and financial information in order to ensure he qualifies to go to the clinic and get meds from [facility].  Patient does not know what an A1C is. Explained what an A1C is. Discussed glucose and A1C goals. Discussed importance of checking  CBGs and maintaining good CBG control to prevent long-term and short-term complications. Explained how hyperglycemia leads to damage within blood vessels which lead to the common complications seen with uncontrolled diabetes. Stressed to the patient the importance of improving glycemic control to prevent further complications from uncontrolled diabetes. Discussed impact of nutrition, exercise, stress, sickness, and medications on diabetes control.  Patient does not currently follow a carb modified diet. Discussed carbohydrates, carbohydrate goals per day and meal, along with portion sizes. Encouraged patient to check glucose as directed and to keep a log book of glucose readings and DM medication taken which patient will need to take to doctor appointments. Explained how the doctor can use the log book to continue to make adjustments with DM medications if needed.  Reviewed hypoglycemia along with treatment and stressed for patient to be sure to check glucose if he has any symptoms of hypoglycemia and be sure to let providers at clinic know so DM medications can be adjusted if needed. Stressed to patient that he will need to provide information to [facility] and [facility], will need to follow up consistently so he can continue to get care and medications prescribed. Patient states that he would be able to have someone to translate to go with him to appointments. Informed patient that Living Well with DM book in Spanish would be ordered. Encouraged patient to read through entire book to learn more about DM and importance of keeping DM controlled.  Patient verbalized understanding of information discussed and reports no further questions at this time related to diabetes. Will ask TOC to provide glucose monitoring kit and assist with medications at discharge.  Thanks, Barnie Alderman, RN, MSN, CDE Diabetes Coordinator Inpatient Diabetes Program [phone] (Team Pager from 8am to 5pm)

## 2021-01-28 NOTE — TOC Initial Note (Addendum)
Transition of Care [facility]) - Initial/Assessment Note    Patient Details  Name: Ramonte Mena MRN: [id number] Date of Birth: November 17, 1965  Transition [facility]) CM/SW Contact:    Marina Goodell Phone Number: [phone] 01/28/2021, 1:11 PM  Clinical Narrative:                  Patient needs Spanish interpreter. Patient presents to [facility] from home due to left foot pain for the past 20 days and open wound and increasingly dizziness.  Patient is independent with all ADLs.  Patient needs Spanish interpreter. Patient has hx of alcohol use and dx of diabetes.  Patient stated he has not taken diabetes medication of insulin for the last couple of months. Patient is currently uninsured. CSW tried to contact patient's sister Cherylynn Ridges [facility]) [phone], available number does not work, unable to leave voicemail.  CSW called patient's number [phone] and left voicemail.  TOC will continue to follow.  Expected Discharge Plan: Home w Home Health Services Barriers to Discharge: Inadequate or no insurance, Continued Medical Work up   Patient Goals and CMS Choice Patient states their goals for this hospitalization and ongoing recovery are:: Left foot pain with open wound and dizziness      Expected Discharge Plan and Services Expected Discharge Plan: Home w Home Health Services   Discharge Planning Services: Medication Assistance, [facility]   Living arrangements for the past 2 months: Single Family Home                                      Prior Living Arrangements/Services Living arrangements for the past 2 months: Single Family Home Lives with:: Self Patient language and need for interpreter reviewed:: Yes (Spanish) Do you feel safe going back to the place where you live?: Yes      Need for Family Participation in Patient Care: Yes (Comment) Care giver support system in place?: No (comment)   Criminal Activity/Legal Involvement Pertinent  to Current Situation/Hospitalization: No - Comment as needed  Activities of Daily Living      Permission Sought/Granted Permission sought to share information with : Family Supports Permission granted to share information with : Yes, Verbal Permission Granted  Share Information with NAME: Cherylynn Ridges (Sister)   [phone] (Mobile)           Emotional Assessment Appearance:: Appears older than stated age Attitude/Demeanor/Rapport: Engaged Affect (typically observed): Stable Orientation: : Oriented to Self, Oriented to Place, Oriented to  Time, Oriented to Situation Alcohol / Substance Use: Not Applicable Psych Involvement: No (comment)  Admission diagnosis:  Hyperglycemia due to diabetes mellitus (HCC) [E11.65] Patient Active Problem List   Diagnosis Date Noted   Hyperglycemia due to diabetes mellitus (HCC) 01/27/2021   Alcohol abuse with intoxication (HCC) 01/12/2020   Hyperglycemia 01/12/2020   Hyponatremia 01/12/2020   Pancreatitis, acute 01/12/2020   PCP:  Patient, No Pcp Per (Inactive) Pharmacy:   [facility] [address] ([address]),  - [address] [place]) [place] [address] Phone: [phone] Fax: [phone]  [facility] [address], [address] [place] [address] Phone: [phone] Fax: [phone]     Social Determinants of Health (SDOH) Interventions    Readmission Risk Interventions No flowsheet data found.

## 2021-01-28 NOTE — Progress Notes (Signed)
PROGRESS NOTE    Cody Cowan  ZOX:[id number] DOB: 27-May-1965 DOA: 01/27/2021 PCP: Patient, No Pcp Per (Inactive)   Chief Complaint  Patient presents with   Wound Infection  Brief Narrative/Hospital Course: Cody Cowan, 55 y.o. 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  To help with infection without improvement.  He has not been on any medication for his diabetes including oral medication or insulin for past couple of months due to not having insurance.Apparently the patient also had a fall from a ladder at work approximately 3 weeks ago. He endorses polyuria, polydipsia and states he is lost approximately 60 pounds.  He also endorses occasional diarrhea. ED Course: Lab work shows hyperglycemia, mild leukopenia with pseudohyponatremia and mildly elevated LFTs.  Does have some thrombocytopenia as well.  Labs also shows anemia and significantly elevated blood sugar at 527 with no anion gap or metabolic acidosis.  Left foot x-ray showed no acute findings.  Left pinky finger x-ray showed no acute findings.  He has been given 1 L of lactated Ringer's as a bolus in the emergency department.  Has also been given vancomycin and Zosyn for antimicrobial therapy.  Has been also given 8 units of NovoLog IV.   Subjective: Seen examined this morning resting comfortably no new complaints.  Assessment & Plan:  Type 2 diabetes mellitus with uncontrolled hyperglycemia: Blood sugar remains poorly controlled, Hba1C 11.2, will likely need insulin-DM coordinator consulted has poor knowledge, may need insulin 70/30 if he is able to manage. Continue current regimen. Recent Labs  Lab 01/27/21 1622 01/27/21 2215  01/28/21 0854  GLUCAP 383* 416* 230*    Left foot with diabetic ulcer: X-ray shows no osteomyelitis, check ABI.  Continue current empiric antibiotics.  Elevated LFTs mild monitor follow-up hepatitis serologies.upper quadrant ultrasound no acute finding.  Hypertriglyceridemia: Repeat fasting lipid profile in a.m. before starting meds  DVT prophylaxis: enoxaparin (LOVENOX) injection 40 mg Start: 01/27/21 2200 Code Status:   Code Status: Full Code Family Communication: plan of care discussed with patient at bedside. Status is: Observation Remains hospitalized for ongoing management of his uncontrolled hyperglycemia.  Disposition: Currently Not medically stable for discharge. Anticipated Disposition: Home   Objective: Vitals last 24 hrs: Vitals:   01/27/21 1802 01/27/21 2210 01/28/21 0215 01/28/21 0612  BP: (!) 144/83 (!) 149/92 124/61 128/60  Pulse: 93 75 79 78  Resp: 16 16 18 18   Temp:   98.8 F (37.1 C) 98.8 F (37.1 C)  TempSrc:   Oral Oral  SpO2: 100% 99% 98% 98%  Weight:      Height:       Weight change:   Intake/Output Summary (Last 24 hours) at 01/28/2021 1156 Last data filed at 01/28/2021 01/30/2021 Gross per 24 hour  Intake 347.1 ml  Output --  Net 347.1 ml   Net IO Since Admission: 347.1 mL [01/28/21 1156]   Physical Examination: General exam: AA0X3, weak,older than stated age. HEENT:Oral mucosa moist, Ear/Nose WNL grossly,dentition normal. Respiratory system: B/l diminished BS, no use of accessory muscle, non tender. Cardiovascular system: S1 & S2 +,No JVD. Gastrointestinal system: Abdomen soft, NT,ND, BS+. Nervous System:Alert, awake, moving extremities. Extremities: edema none, distal peripheral pulses palpable.  Skin: No rashes, no icterus. MSK: Normal muscle bulk,  tone, power.  Medications reviewed:  Scheduled Meds:  enoxaparin (LOVENOX) injection  40 mg Subcutaneous QHS   insulin aspart  0-5 Units Subcutaneous QHS   insulin aspart  0-9 Units  Subcutaneous TID WC   insulin detemir  10 Units Subcutaneous QHS   linagliptin  5 mg Oral Daily   living well with diabetes book- in spanish   Does not apply Once   metFORMIN  500 mg Oral BID WC   multivitamin with minerals  1 tablet Oral Daily   Continuous Infusions:  ceFEPime (MAXIPIME) IV Stopped (01/28/21 0724)   vancomycin Stopped (01/28/21 0414)    Diet Order             Diet Carb Modified Fluid consistency: Thin; Room service appropriate? Yes  Diet effective now                          Weight change:   Wt Readings from Last 3 Encounters:  01/27/21 56.2 kg  01/12/20 67.1 kg     Consultants:see note  Procedures:see note Antimicrobials: Anti-infectives (From admission, onward)    Start     Dose/Rate Route Frequency Ordered Stop   01/28/21 0100  vancomycin (VANCOREADY) IVPB 750 mg/150 mL        750 mg 150 mL/hr over 60 Minutes Intravenous Every 12 hours 01/27/21 1707     01/27/21 2200  ceFEPIme (MAXIPIME) 2 g in sodium chloride 0.9 % 100 mL IVPB        2 g 200 mL/hr over 30 Minutes Intravenous Every 8 hours 01/27/21 1707     01/27/21 1315  piperacillin-tazobactam (ZOSYN) IVPB 3.375 g        3.375 g 12.5 mL/hr over 240 Minutes Intravenous  Once 01/27/21 1304 01/27/21 1400   01/27/21 1300  piperacillin-tazobactam (ZOSYN) IVPB 3.375 g  Status:  Discontinued        3.375 g 18.75 mL/hr over 240 Minutes Intravenous  Once 01/27/21 1258 01/27/21 1304   01/27/21 1300  vancomycin (VANCOCIN) IVPB 1000 mg/200 mL premix        1,000 mg 200 mL/hr over 60 Minutes Intravenous  Once 01/27/21 1258 01/27/21 1624      Culture/Microbiology No results found for: SDES, SPECREQUEST, CULT, REPTSTATUS  Other culture-see note  Unresulted Labs (From admission, onward)     Start     Ordered   01/28/21 0626  LDL cholesterol, direct  Once,   STAT        01/28/21 0626   01/28/21 0500  Hemoglobin A1c  Tomorrow morning,   STAT        01/27/21 1433   01/28/21 0500  Hepatitis  panel, acute  Tomorrow morning,   STAT        01/27/21 1433   01/28/21 0500  HIV Antibody (routine testing w rflx)  Once,   AD        01/28/21 0500          Data Reviewed: I have personally reviewed following labs and imaging studies CBC: Recent Labs  Lab 01/27/21 1126 01/28/21 0626  WBC 3.6* 4.2  NEUTROABS 2.0  --   HGB 11.8* 11.1*  HCT 34.4* 31.5*  MCV 99.7 98.1  PLT 106* 105*   Basic Metabolic Panel: Recent Labs  Lab 01/27/21 1126 01/28/21 0626  NA 126* 130*  K 4.8 3.6  CL 92* 97*  CO2 27 28  GLUCOSE 527* 217*  BUN 13 11  CREATININE 0.68 0.53*  CALCIUM 9.2 8.8*   GFR: Estimated Creatinine Clearance: 83.9 mL/min (A) (by C-G formula based on SCr of 0.53 mg/dL (L)). Liver Function Tests: Recent Labs  Lab 01/27/21 1126 01/28/21 0626  AST 76* 54*  ALT 56* 42  ALKPHOS 416* 274*  BILITOT 1.3* 1.2  PROT 7.4 6.5  ALBUMIN 3.3* 2.8*   No results for input(s): LIPASE, AMYLASE in the last 168 hours. No results for input(s): AMMONIA in the last 168 hours. Coagulation Profile: No results for input(s): INR, PROTIME in the last 168 hours. Cardiac Enzymes: No results for input(s): CKTOTAL, CKMB, CKMBINDEX, TROPONINI in the last 168 hours. BNP (last 3 results) No results for input(s): PROBNP in the last 8760 hours. HbA1C: No results for input(s): HGBA1C in the last 72 hours. CBG: Recent Labs  Lab 01/27/21 1622 01/27/21 2215 01/28/21 0854  GLUCAP 383* 416* 230*   Lipid Profile: Recent Labs    01/28/21 0626  CHOL 345*  HDL 18*  LDLCALC UNABLE TO CALCULATE IF TRIGLYCERIDE OVER 400 mg/dL  TRIG 161*  CHOLHDL 09.6   Thyroid Function Tests: Recent Labs    01/28/21 0626  TSH 9.501*   Anemia Panel: No results for input(s): VITAMINB12, FOLATE, FERRITIN, TIBC, IRON, RETICCTPCT in the last 72 hours. Sepsis Labs: No results for input(s): PROCALCITON, LATICACIDVEN in the last 168 hours.  Recent Results (from the past 240 hour(s))  Resp Panel by RT-PCR (Flu  A&B, Covid) Nasopharyngeal Swab     Status: None   Collection Time: 01/27/21  2:10 PM   Specimen: Nasopharyngeal Swab; Nasopharyngeal(NP) swabs in vial transport medium  Result Value Ref Range Status   SARS Coronavirus 2 by RT PCR NEGATIVE NEGATIVE Final    Comment: (NOTE) SARS-CoV-2 target nucleic acids are NOT DETECTED.  The SARS-CoV-2 RNA is generally detectable in upper respiratory specimens during the acute phase of infection. The lowest concentration of SARS-CoV-2 viral copies this assay can detect is 138 copies/mL. A negative result does not preclude SARS-Cov-2 infection and should not be used as the sole basis for treatment or other patient management decisions. A negative result may occur with  improper specimen collection/handling, submission of specimen other than nasopharyngeal swab, presence of viral mutation(s) within the areas targeted by this assay, and inadequate number of viral copies(<138 copies/mL). A negative result must be combined with clinical observations, patient history, and epidemiological information. The expected result is Negative.  Fact Sheet for Patients:  BloggerCourse.com  Fact Sheet for Healthcare Providers:  SeriousBroker.it  This test is no t yet approved or cleared by the [place] FDA and  has been authorized for detection and/or diagnosis of SARS-CoV-2 by FDA under an Emergency Use Authorization (EUA). This EUA will remain  in effect (meaning this test can be used) for the duration of the COVID-19 declaration under Section 564(b)(1) of the Act, 21 U.S.C.section 360bbb-3(b)(1), unless the authorization is terminated  or revoked sooner.       Influenza A by PCR NEGATIVE NEGATIVE Final   Influenza B by PCR NEGATIVE NEGATIVE Final    Comment: (NOTE) The Xpert Xpress SARS-CoV-2/FLU/RSV plus assay is intended as an aid in the diagnosis of influenza from Nasopharyngeal swab specimens  and should not be used as a sole basis for treatment. Nasal washings and aspirates are unacceptable for Xpert Xpress SARS-CoV-2/FLU/RSV testing.  Fact Sheet for Patients: BloggerCourse.com  Fact Sheet for Healthcare Providers: SeriousBroker.it  This test is not yet approved or cleared by the [place] and has been authorized for  detection and/or diagnosis of SARS-CoV-2 by FDA under an Emergency Use Authorization (EUA). This EUA will remain in effect (meaning this test can be used) for the duration of the COVID-19 declaration under Section 564(b)(1) of the Act, 21 U.S.C. section 360bbb-3(b)(1), unless the authorization is terminated or revoked.  Performed at [facility], [address] [place], [place] [address]   MRSA Next Gen by PCR, Nasal     Status: None   Collection Time: 01/27/21  6:08 PM   Specimen: Nasal Mucosa; Nasal Swab  Result Value Ref Range Status   MRSA by PCR Next Gen NOT DETECTED NOT DETECTED Final    Comment: (NOTE) The GeneXpert MRSA Assay (FDA approved for NASAL specimens only), is one component of a comprehensive MRSA colonization surveillance program. It is not intended to diagnose MRSA infection nor to guide or monitor treatment for MRSA infections. Test performance is not FDA approved in patients less than 65 years old. Performed at [facility], [address] [place], [place] [address]      Radiology Studies: MR FOOT LEFT WO CONTRAST  Result Date: 01/27/2021 CLINICAL DATA:  Diabetic foot swelling, osteomyelitis suspected. Medial and lateral foot wounds for the last month. Intermittent drainage. EXAM: MRI OF THE LEFT FOOT WITHOUT CONTRAST TECHNIQUE: Multiplanar, multisequence MR imaging of the left hindfoot was performed. No intravenous contrast was administered. COMPARISON:  Foot radiographs same date FINDINGS: TENDONS Peroneal: Chronic tendinosis and partial tearing of  the peroneal tendons which appear laterally subluxed from the retromalleolar groove. There is soft tissue thickening posterior and inferior to the distal fibula suspicious for synovitis. Posteromedial: Intact and normally positioned. There is a small amount of fluid within the posterior tibialis and flexor hallucis longus tendons. Anterior: Intact and normally positioned. Achilles: Intact. Plantar Fascia: Intact. LIGAMENTS Lateral: The anterior and posterior talofibular and calcaneofibular ligaments are intact.The inferior tibiofibular ligaments appear intact. Medial: The deltoid and visualized portions of the spring ligament appear intact. CARTILAGE AND BONES Ankle Joint: Small ankle joint effusion with mild tibiotalar degenerative changes. The talar dome and tibial plafond appear normal. Subtalar Joints/Sinus Tarsi: Lateral hindfoot valgus and pes planus deformities with diffuse replacement of the tarsal sinus fat. The subtalar joint appears normal. No definite tarsal coalition. The sub fibular space is narrowed with inflammatory changes surrounding the laterally subluxed peroneal tendons. Bones: Mild degenerative changes at the calcaneocuboid joint. The alignment is normal at the Lisfranc joint. Mild spurring of both malleoli. No acute osseous findings or evidence of osteomyelitis. Other: Apparent soft tissue thickening over the lateral malleolus and peroneal tendons which may reflect synovitis or granulation tissue. No well-defined ulcer is identified in this region. There is no focal fluid collection. IMPRESSION: 1. Hindfoot valgus deformity with lateral subluxation of the peroneal tendons and surrounding synovitis or granulation tissue consistent with lateral hindfoot impingement syndrome. 2. No focal fluid collection or specific evidence of osteomyelitis. 3. No evidence of tarsal coalition as questioned previously. Degenerative changes as described. Electronically Signed   By: Carey Bullocks M.D.   On:  01/27/2021 16:22   US ARTERIAL ABI (SCREENING LOWER EXTREMITY)  Result Date: 01/28/2021 CLINICAL DATA:  55 year old male with diabetes and cardiovascular risk factors EXAM: NONINVASIVE PHYSIOLOGIC VASCULAR STUDY OF BILATERAL LOWER EXTREMITIES TECHNIQUE: Evaluation of both lower extremities was performed at rest, including calculation of ankle-brachial indices, multiple segmental pressure evaluation, segmental Doppler and segmental pulse volume recording. COMPARISON:  None. FINDINGS: Right ABI:  1.34 Left ABI:  1.51 Right Lower Extremity: Segmental Doppler  at the right ankle demonstrates biphasic waveforms Left Lower Extremity: Segmental Doppler at the left ankle demonstrates biphasic waveforms IMPRESSION: Resting ABI the bilateral lower extremity likely falsely elevated, potentially secondary to noncompressible vessels/medial calcinosis. Segmental Doppler at the bilateral ankles demonstrates waveforms relatively maintained Signed, Yvone Neu. Reyne Dumas, RPVI [facility] Electronically Signed   By: Gilmer Mor D.O.   On: 01/28/2021 11:21   DG Finger Little Left  Result Date: 01/27/2021 CLINICAL DATA:  Little finger pain since falling off of a ladder 20 days ago. History of diabetes. Pain/wound. EXAM: LEFT LITTLE FINGER 2+V COMPARISON:  None. FINDINGS: The bones appear demineralized. No evidence of acute fracture, dislocation or bone destruction. The joint spaces are preserved. No soft tissue wound, foreign body or soft tissue emphysema identified. IMPRESSION: No acute osseous findings or radiographic evidence of osteomyelitis. Electronically Signed   By: Carey Bullocks M.D.   On: 01/27/2021 11:13   DG Foot Complete Left  Result Date: 01/27/2021 CLINICAL DATA:  Left lateral foot pain since falling off of a ladder 20 days ago. Pain/wound. EXAM: LEFT FOOT - COMPLETE 3+ VIEW COMPARISON:  None. FINDINGS: The bones appear diffusely demineralized.  There is no evidence of acute fracture dislocation or bone destruction. There is a pes planus deformity with poor definition of the subtalar joint and dorsal talar beaking, suspicious for underlying tarsal coalition. Mild additional degenerative changes are present in the midfoot and 1st MTP joint. No soft tissue wound identified. There are prominent vascular calcifications suggesting underlying diabetes. IMPRESSION: 1. No acute osseous findings or radiographic evidence of osteomyelitis. 2. 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 Electronically Signed   By: Carey Bullocks M.D.   On: 01/27/2021 11:12   US Abdomen Limited RUQ (LIVER/GB)  Result Date: 01/28/2021 CLINICAL DATA:  Transaminitis.  Denies abdominal pain. EXAM: ULTRASOUND ABDOMEN LIMITED RIGHT UPPER QUADRANT COMPARISON:  Right upper quadrant ultrasound dated January 13, 2020. FINDINGS: Gallbladder: No gallstones or wall thickening visualized. No sonographic Murphy sign noted by sonographer. Common bile duct: Diameter: 3 mm, normal. Liver: No focal lesion identified. Unchanged diffusely increased parenchymal echogenicity. Portal vein is patent on color Doppler imaging with normal direction of blood flow towards the liver. Other: None. IMPRESSION: 1. No acute abnormality. 2. Unchanged hepatic steatosis. Electronically Signed   By: Obie Dredge M.D.   On: 01/28/2021 07:33     LOS: 0 days   Lanae Boast, MD [facility]  01/28/2021, 11:56 AM

## 2021-01-29 DIAGNOSIS — E118 Type 2 diabetes mellitus with unspecified complications: Secondary | ICD-10-CM | POA: Diagnosis present

## 2021-01-29 LAB — GLUCOSE, CAPILLARY
Glucose-Capillary: 262 mg/dL — ABNORMAL HIGH (ref 70–99)
Glucose-Capillary: 267 mg/dL — ABNORMAL HIGH (ref 70–99)
Glucose-Capillary: 133 mg/dL — ABNORMAL HIGH (ref 70–99)
Glucose-Capillary: 128 mg/dL — ABNORMAL HIGH (ref 70–99)

## 2021-01-29 LAB — LIPID PANEL
Cholesterol: 313 mg/dL — ABNORMAL HIGH (ref 0–200)
HDL: 23 mg/dL — ABNORMAL LOW (ref >40)
LDL Cholesterol: UNDETERMINED mg/dL (ref 0–99)
Total CHOL/HDL Ratio: 13.6 RATIO
Triglycerides: 487 mg/dL — ABNORMAL HIGH (ref <150)
VLDL: UNDETERMINED mg/dL (ref 0–40)

## 2021-01-29 LAB — T4, FREE: Free T4: 0.82 ng/dL (ref 0.61–1.12)

## 2021-01-29 LAB — LDL CHOLESTEROL, DIRECT: Direct LDL: 195 mg/dL — ABNORMAL HIGH (ref 0–99)

## 2021-01-29 MED ORDER — GLUCERNA SHAKE PO LIQD
237.0000 mL | Freq: Three times a day (TID) | ORAL | Status: DC
  Administered 2021-01-29 – 2021-02-01 (×8): 237 mL via ORAL

## 2021-01-29 MED ORDER — INSULIN ASPART PROT & ASPART (70-30 MIX) 100 UNIT/ML ~~LOC~~ SUSP
25.0000 [IU] | Freq: Two times a day (BID) | SUBCUTANEOUS | Status: DC
  Administered 2021-01-29 – 2021-01-30 (×4): 25 [IU] via SUBCUTANEOUS
  Filled 2021-01-29: qty 10

## 2021-01-29 NOTE — Consult Note (Signed)
WOC Nurse wound consult note Consultation was completed by review of records, images and assistance from the bedside nurse/clinical staff.   Reason for Consult:foot wounds Patient with history of DM; non compliance with medications. Full thickness foot ulcerations present for several weeks. 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 Needs offloading however a challenge with bilateral foot wounds. Top with dry dressings.  Lost to follow up for foot care; recommend podiatry consult inpatient for that reason  Re consult if needed, will not follow at this time. Thanks  Eulah Walkup [employer], RN,CWOCN, CNS, CWON-AP [phone])

## 2021-01-29 NOTE — Plan of Care (Signed)
  Problem: Education: Goal: Knowledge of General Education information will improve Description: Including pain rating scale, medication(s)/side effects and non-pharmacologic comfort measures Outcome: Progressing   Problem: Health Behavior/Discharge Planning: Goal: Ability to manage health-related needs will improve Outcome: Progressing   Problem: Clinical Measurements: Goal: Ability to maintain clinical measurements within normal limits will improve Outcome: Progressing   Problem: Activity: Goal: Risk for activity intolerance will decrease Outcome: Progressing   Problem: Nutrition: Goal: Adequate nutrition will be maintained Outcome: Progressing   Problem: Elimination: Goal: Will not experience complications related to urinary retention Outcome: Progressing   Problem: Pain Managment: Goal: General experience of comfort will improve Outcome: Progressing   Problem: Safety: Goal: Ability to remain free from injury will improve Outcome: Progressing   Problem: Skin Integrity: Goal: Risk for impaired skin integrity will decrease Outcome: Progressing   Problem: Health Behavior/Discharge Planning: Goal: Ability to manage health-related needs will improve Outcome: Progressing   Problem: Metabolic: Goal: Ability to maintain appropriate glucose levels will improve Outcome: Progressing

## 2021-01-29 NOTE — TOC Progression Note (Signed)
Transition of Care [facility]) - Progression Note    Patient Details  Name: Hanson Medeiros MRN: [id number] Date of Birth: [birth year]  [facility]) CM/SW Contact  Hetty Ely, RN Phone Number: 01/29/2021, 4:41 PM  Clinical Narrative: In rounds patient not receptive to DM Insulin administration education. NMS for discharge will require more IV Abx.      Expected Discharge Plan: Home w Home Health Services Barriers to Discharge: Inadequate or no insurance, Continued Medical Work up  Expected Discharge Plan and Services Expected Discharge Plan: Home w Home Health Services   Discharge Planning Services: Medication Assistance, Indigent Health Clinic   Living arrangements for the past 2 months: Single Family Home                                       Social Determinants of Health (SDOH) Interventions    Readmission Risk Interventions No flowsheet data found.

## 2021-01-29 NOTE — Progress Notes (Signed)
PROGRESS NOTE    Cody Cowan  JKK:[id number] DOB: [birth year] DOA: 01/27/2021 PCP: Patient, No Pcp Per (Inactive)   Chief Complaint  Patient presents with   Wound Infection  Brief Narrative/Hospital Course: Cody Cowan, 55 y.o. 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  To help with infection without improvement.  He has not been on any medication for his diabetes including oral medication or insulin for past couple of months due to not having insurance.Apparently the patient also had a fall from a ladder at work approximately 3 weeks ago. He endorses polyuria, polydipsia and states he is lost approximately 60 pounds.  He also endorses occasional diarrhea. ED Course: Lab work shows hyperglycemia, mild leukopenia with pseudohyponatremia and mildly elevated LFTs.  Does have some thrombocytopenia as well.  Labs also shows anemia and significantly elevated blood sugar at 527 with no anion gap or metabolic acidosis.  Left foot x-ray showed no acute findings.  Left pinky finger x-ray showed no acute findings.  He has been given 1 L of lactated Ringer's as a bolus in the emergency department.  Has also been given vancomycin and Zosyn for antimicrobial therapy.  Has been also given 8 units of NovoLog IV.   Subjective:  Seen and examined this morning.  Alert awake.  No new complaints.   Blood sugar remains poorly controlled.    Assessment & Plan:  Type 2 diabetes mellitus with uncontrolled hyperglycemia: Blood sugar remains poorly controlled, Hba1C 11.2, will likely need insulin-DM coordinator consulted has poor knowledge, started on insulin 70/30 due to affordability.  Uptitrate insulin  to 25 u bid , cont  metformin, jardiance,keep on SSI, continue to do diabetic teaching insulin teaching hypoglycemia teaching Recent Labs  Lab 01/28/21 0854 01/28/21 1405 01/28/21 1652 01/28/21 2128 01/29/21 0738  GLUCAP 230* 325* 309* 181* 262*     Left foot with diabetic ulcer: X-ray shows no osteomyelitis, checked ABI- Resting ABI the bilateral lower extremity likely falsely elevated,potentially secondary to noncompressible vessels/medial calcinosis.Segmental Doppler at the bilateral ankles demonstrates waveforms relatively maintained.  Continue antibiotics for today hopefully can switch to oral Keflex tomorrow. ?podaitry eval  Elevated LFTs mild monitor follow-up hepatitis serologies.upper quadrant ultrasound no acute finding.  Hypertriglyceridemia: Repeat fasting lipid profile with TG < 500.  Currently manages diabetes, may consider Meds OP or on D/C.  Abnormal thyroid function with TSH elevated at 1.5 normal free T4 recheck in 3 weeks  DVT prophylaxis: enoxaparin (LOVENOX) injection 40 mg Start: 01/27/21 2200 Code Status:   Code Status: Full Code Family Communication: plan of care discussed with patient at bedside. Status is: admitted as Observation Remains hospitalized for ongoing management of his uncontrolled hyperglycemia, in office on, needs further titration of his insulin regimen, will change to inpatient. Disposition: Currently Not medically stable for discharge. Anticipated Disposition: Home   Objective: Vitals last 24 hrs: Vitals:   01/28/21 1446 01/28/21 2051 01/29/21 0605 01/29/21 0737  BP: 138/81 122/77 (!) 151/87 (!) 160/97  Pulse: 94 89 83 88  Resp: 16 18 18 18   Temp: 98 F (36.7 C) 98.6 F (37 C) 98.5 F (36.9 C) 97.9 F (36.6 C)  TempSrc: Oral Oral Oral Oral  SpO2: 100% 100% 98% 100%  Weight:  Height:       Weight change:   Intake/Output Summary (Last 24 hours) at 01/29/2021 1128 Last data filed at 01/29/2021 0850 Gross per 24 hour  Intake 960.94 ml  Output  --  Net 960.94 ml    Net IO Since Admission: 1,308.04 mL [01/29/21 1128]   Physical Examination: General exam: AAOx 3 older than stated age, weak appearing. HEENT:Oral mucosa moist, Ear/Nose WNL grossly, dentition normal. Respiratory system: bilaterally diminished, , no use of accessory muscle Cardiovascular system: S1 & S2 +, No JVD,. Gastrointestinal system: Abdomen soft, NT,ND, BS+ Nervous System:Alert, awake, moving extremities and grossly nonfocal Extremities: No edema, distal peripheral pulses palpable.  Skin: No rashes,no icterus.  Wound on left wound  as shown MSK: Normal muscle bulk,tone, power      Medications reviewed:  Scheduled Meds:  enoxaparin (LOVENOX) injection  40 mg Subcutaneous QHS   insulin aspart  0-5 Units Subcutaneous QHS   insulin aspart  0-9 Units Subcutaneous TID WC   insulin aspart protamine- aspart  25 Units Subcutaneous BID WC   linagliptin  5 mg Oral Daily   living well with diabetes book- in spanish   Does not apply Once   metFORMIN  500 mg Oral BID WC   multivitamin with minerals  1 tablet Oral Daily   Continuous Infusions:  ceFEPime (MAXIPIME) IV 2 g (01/29/21 0519)   vancomycin 750 mg (01/29/21 0139)    Diet Order             Diet Carb Modified Fluid consistency: Thin; Room service appropriate? Yes  Diet effective now                   Weight change:   Wt Readings from Last 3 Encounters:  01/27/21 56.2 kg  01/12/20 67.1 kg     Consultants:see note  Procedures:see note Antimicrobials: Anti-infectives (From admission, onward)    Start     Dose/Rate Route Frequency Ordered Stop   01/28/21 0100  vancomycin (VANCOREADY) IVPB 750 mg/150 mL        750 mg 150 mL/hr over 60 Minutes Intravenous Every 12 hours 01/27/21 1707     01/27/21 2200  ceFEPIme (MAXIPIME) 2 g in sodium chloride 0.9 % 100 mL IVPB        2 g 200 mL/hr over 30 Minutes Intravenous Every 8 hours 01/27/21 1707     01/27/21 1315  piperacillin-tazobactam (ZOSYN)  IVPB 3.375 g        3.375 g 12.5 mL/hr over 240 Minutes Intravenous  Once 01/27/21 1304 01/27/21 1400   01/27/21 1300  piperacillin-tazobactam (ZOSYN) IVPB 3.375 g  Status:  Discontinued        3.375 g 18.75 mL/hr over 240 Minutes Intravenous  Once 01/27/21 1258 01/27/21 1304   01/27/21 1300  vancomycin (VANCOCIN) IVPB 1000 mg/200 mL premix        1,000 mg 200 mL/hr over 60 Minutes Intravenous  Once 01/27/21 1258 01/27/21 1624      Culture/Microbiology No results found for: SDES, SPECREQUEST, CULT, REPTSTATUS  Other culture-see note  Unresulted Labs (From admission, onward)    None     Data Reviewed: I have personally reviewed following labs and imaging studies CBC: Recent Labs  Lab 01/27/21 1126 01/28/21 0626  WBC 3.6* 4.2  NEUTROABS 2.0  --   HGB 11.8* 11.1*  HCT 34.4* 31.5*  MCV 99.7 98.1  PLT 106* 105*    Basic Metabolic Panel: Recent Labs  Lab 01/27/21 1126 01/28/21 0626  NA 126* 130*  K 4.8 3.6  CL 92* 97*  CO2 27 28  GLUCOSE 527* 217*  BUN 13 11  CREATININE 0.68 0.53*  CALCIUM 9.2 8.8*    GFR: Estimated Creatinine Clearance: 83.9 mL/min (A) (by C-G formula based on SCr of 0.53 mg/dL (L)). Liver Function Tests: Recent Labs  Lab 01/27/21 1126 01/28/21 0626  AST 76* 54*  ALT 56* 42  ALKPHOS 416* 274*  BILITOT 1.3* 1.2  PROT 7.4 6.5  ALBUMIN 3.3* 2.8*    No results for input(s): LIPASE, AMYLASE in the last 168 hours. No results for input(s): AMMONIA in the last 168 hours. Coagulation Profile: No results for input(s): INR, PROTIME in the last 168 hours. Cardiac Enzymes: No results for input(s): CKTOTAL, CKMB, CKMBINDEX, TROPONINI in the last 168 hours. BNP (last 3 results) No results for input(s): PROBNP in the last 8760 hours. HbA1C: Recent Labs    01/28/21 0626  HGBA1C 10.2*   CBG: Recent Labs  Lab 01/28/21 0854 01/28/21 1405 01/28/21 1652 01/28/21 2128 01/29/21 0738  GLUCAP 230* 325* 309* 181* 262*    Lipid  Profile: Recent Labs    01/28/21 0626 01/29/21 0527  CHOL 345* 313*  HDL 18* 23*  LDLCALC UNABLE TO CALCULATE IF TRIGLYCERIDE OVER 400 mg/dL UNABLE TO CALCULATE IF TRIGLYCERIDE OVER 400 mg/dL  TRIG 161* 096*  CHOLHDL 19.2 13.6  LDLDIRECT 191.0* 195.0*    Thyroid Function Tests: Recent Labs    01/28/21 0626 01/29/21 0855  TSH 9.501*  --   FREET4  --  0.82    Anemia Panel: No results for input(s): VITAMINB12, FOLATE, FERRITIN, TIBC, IRON, RETICCTPCT in the last 72 hours. Sepsis Labs: No results for input(s): PROCALCITON, LATICACIDVEN in the last 168 hours.  Recent Results (from the past 240 hour(s))  Resp Panel by RT-PCR (Flu A&B, Covid) Nasopharyngeal Swab     Status: None   Collection Time: 01/27/21  2:10 PM   Specimen: Nasopharyngeal Swab; Nasopharyngeal(NP) swabs in vial transport medium  Result Value Ref Range Status   SARS Coronavirus 2 by RT PCR NEGATIVE NEGATIVE Final    Comment: (NOTE) SARS-CoV-2 target nucleic acids are NOT DETECTED.  The SARS-CoV-2 RNA is generally detectable in upper respiratory specimens during the acute phase of infection. The lowest concentration of SARS-CoV-2 viral copies this assay can detect is 138 copies/mL. A negative result does not preclude SARS-Cov-2 infection and should not be used as the sole basis for treatment or other patient management decisions. A negative result may occur with  improper specimen collection/handling, submission of specimen other than nasopharyngeal swab, presence of viral mutation(s) within the areas targeted by this assay, and inadequate number of viral copies(<138 copies/mL). A negative result must be combined with clinical observations, patient history, and epidemiological information. The expected result is Negative.  Fact Sheet for Patients:  BloggerCourse.com  Fact Sheet for Healthcare Providers:  SeriousBroker.it  This test is no t yet approved  or cleared by the [place] FDA and  has been authorized for detection and/or diagnosis of SARS-CoV-2 by FDA under an Emergency Use Authorization (EUA). This EUA will remain  in effect (meaning this test can be used) for the duration of the COVID-19 declaration under Section 564(b)(1) of the Act, 21 U.S.C.section 360bbb-3(b)(1), unless the authorization is terminated  or revoked sooner.       Influenza A by PCR NEGATIVE NEGATIVE Final   Influenza B by PCR NEGATIVE NEGATIVE Final    Comment: (NOTE) The Xpert Xpress SARS-CoV-2/FLU/RSV  plus assay is intended as an aid in the diagnosis of influenza from Nasopharyngeal swab specimens and should not be used as a sole basis for treatment. Nasal washings and aspirates are unacceptable for Xpert Xpress SARS-CoV-2/FLU/RSV testing.  Fact Sheet for Patients: BloggerCourse.com  Fact Sheet for Healthcare Providers: SeriousBroker.it  This test is not yet approved or cleared by the [place] FDA and has been authorized for detection and/or diagnosis of SARS-CoV-2 by FDA under an Emergency Use Authorization (EUA). This EUA will remain in effect (meaning this test can be used) for the duration of the COVID-19 declaration under Section 564(b)(1) of the Act, 21 U.S.C. section 360bbb-3(b)(1), unless the authorization is terminated or revoked.  Performed at [facility], [address] [place], [place] [address]   MRSA Next Gen by PCR, Nasal     Status: None   Collection Time: 01/27/21  6:08 PM   Specimen: Nasal Mucosa; Nasal Swab  Result Value Ref Range Status   MRSA by PCR Next Gen NOT DETECTED NOT DETECTED Final    Comment: (NOTE) The GeneXpert MRSA Assay (FDA approved for NASAL specimens only), is one component of a comprehensive MRSA colonization surveillance program. It is not intended to diagnose MRSA infection nor to guide or monitor treatment for MRSA  infections. Test performance is not FDA approved in patients less than 75 years old. Performed at [facility], [address] [place], [place] [address]       Radiology Studies: MR FOOT LEFT WO CONTRAST  Result Date: 01/27/2021 CLINICAL DATA:  Diabetic foot swelling, osteomyelitis suspected. Medial and lateral foot wounds for the last month. Intermittent drainage. EXAM: MRI OF THE LEFT FOOT WITHOUT CONTRAST TECHNIQUE: Multiplanar, multisequence MR imaging of the left hindfoot was performed. No intravenous contrast was administered. COMPARISON:  Foot radiographs same date FINDINGS: TENDONS Peroneal: Chronic tendinosis and partial tearing of the peroneal tendons which appear laterally subluxed from the retromalleolar groove. There is soft tissue thickening posterior and inferior to the distal fibula suspicious for synovitis. Posteromedial: Intact and normally positioned. There is a small amount of fluid within the posterior tibialis and flexor hallucis longus tendons. Anterior: Intact and normally positioned. Achilles: Intact. Plantar Fascia: Intact. LIGAMENTS Lateral: The anterior and posterior talofibular and calcaneofibular ligaments are intact.The inferior tibiofibular ligaments appear intact. Medial: The deltoid and visualized portions of the spring ligament appear intact. CARTILAGE AND BONES Ankle Joint: Small ankle joint effusion with mild tibiotalar degenerative changes. The talar dome and tibial plafond appear normal. Subtalar Joints/Sinus Tarsi: Lateral hindfoot valgus and pes planus deformities with diffuse replacement of the tarsal sinus fat. The subtalar joint appears normal. No definite tarsal coalition. The sub fibular space is narrowed with inflammatory changes surrounding the laterally subluxed peroneal tendons. Bones: Mild degenerative changes at the calcaneocuboid joint. The alignment is normal at the Lisfranc joint. Mild spurring of both malleoli. No acute osseous findings  or evidence of osteomyelitis. Other: Apparent soft tissue thickening over the lateral malleolus and peroneal tendons which may reflect synovitis or granulation tissue. No well-defined ulcer is identified in this region. There is no focal fluid collection. IMPRESSION: 1. Hindfoot valgus deformity with lateral subluxation of the peroneal tendons and surrounding synovitis or granulation tissue consistent with lateral hindfoot impingement syndrome. 2. No focal fluid collection or specific evidence of osteomyelitis. 3. No evidence of tarsal coalition as questioned previously. Degenerative changes as described. Electronically Signed   By: Carey Bullocks M.D.   On: 01/27/2021 16:22   US ARTERIAL  ABI (SCREENING LOWER EXTREMITY)  Result Date: 01/28/2021 CLINICAL DATA:  55 year old male with diabetes and cardiovascular risk factors EXAM: NONINVASIVE PHYSIOLOGIC VASCULAR STUDY OF BILATERAL LOWER EXTREMITIES TECHNIQUE: Evaluation of both lower extremities was performed at rest, including calculation of ankle-brachial indices, multiple segmental pressure evaluation, segmental Doppler and segmental pulse volume recording. COMPARISON:  None. FINDINGS: Right ABI:  1.34 Left ABI:  1.51 Right Lower Extremity: Segmental Doppler at the right ankle demonstrates biphasic waveforms Left Lower Extremity: Segmental Doppler at the left ankle demonstrates biphasic waveforms IMPRESSION: Resting ABI the bilateral lower extremity likely falsely elevated, potentially secondary to noncompressible vessels/medial calcinosis. Segmental Doppler at the bilateral ankles demonstrates waveforms relatively maintained Signed, Yvone Neu. Reyne Dumas, RPVI [facility] Electronically Signed   By: Gilmer Mor D.O.   On: 01/28/2021 11:21   US Abdomen Limited RUQ (LIVER/GB)  Result Date: 01/28/2021 CLINICAL DATA:  Transaminitis.  Denies abdominal pain. EXAM: ULTRASOUND ABDOMEN LIMITED RIGHT  UPPER QUADRANT COMPARISON:  Right upper quadrant ultrasound dated January 13, 2020. FINDINGS: Gallbladder: No gallstones or wall thickening visualized. No sonographic Murphy sign noted by sonographer. Common bile duct: Diameter: 3 mm, normal. Liver: No focal lesion identified. Unchanged diffusely increased parenchymal echogenicity. Portal vein is patent on color Doppler imaging with normal direction of blood flow towards the liver. Other: None. IMPRESSION: 1. No acute abnormality. 2. Unchanged hepatic steatosis. Electronically Signed   By: Obie Dredge M.D.   On: 01/28/2021 07:33     LOS: 0 days   Lanae Boast, MD [facility]  01/29/2021, 11:28 AM

## 2021-01-29 NOTE — Plan of Care (Signed)

## 2021-01-29 NOTE — Progress Notes (Signed)
Initial Nutrition Assessment  DOCUMENTATION CODES:   Severe malnutrition in context of chronic illness  INTERVENTION:   Glucerna Shake po TID, each supplement provides 220 kcal and 10 grams of protein  MVI po daily   Pt at high refeed risk; recommend monitor potassium, magnesium and phosphorus labs daily until stable  NUTRITION DIAGNOSIS:   Severe Malnutrition related to chronic illness (uncontrolled DM, etoh abuse) as evidenced by severe fat depletion, severe muscle depletion.  GOAL:   Patient will meet greater than or equal to 90% of their needs  MONITOR:   PO intake, Supplement acceptance, Labs, Weight trends, Skin, I & O's  REASON FOR ASSESSMENT:   Malnutrition Screening Tool    ASSESSMENT:   55 y/o male with h/o etoh abuse, HLD, pancreatitis and DM who is admitted with non-healing diabetic foot wounds.  Met with pt in room today using video interpreter. Pt reports good appetite and oral intake pta and in hospital. Pt reports that despite eating well, he has continued to loose significant weight. Pt reports that his UBW is ~180lbs and that he has lost ~60lbs over the past 3-4 years. Per chart, pt is down 24lbs(16%) since November 2021. RD discussed with pt the importance of adequate nutrition needed to preserve lean muscle. Pt reports that he is willing to drink strawberry supplements in hospital. RD will add supplements and MVI to help pt meet his estimated needs. Pt is likely at high refeed risk. RD provided pt with diabetes diet education today.   Medications reviewed and include: lovenox, insulin, metformin, MVI, cefepime, vancomycin   Labs reviewed: Na 130(L), K 3.6 wnl, creat 0.53(L) Hgb 11.1(L), Hct 31.5(L) Cbgs- 267, 262 x 24 hrs AIC 10.2(H)- 11/24  NUTRITION - FOCUSED PHYSICAL EXAM:  Flowsheet Row Most Recent Value  Orbital Region Moderate depletion  Upper Arm Region Severe depletion  Thoracic and Lumbar Region Severe depletion  Buccal Region Moderate  depletion  Temple Region Moderate depletion  Clavicle Bone Region Severe depletion  Clavicle and Acromion Bone Region Severe depletion  Scapular Bone Region Severe depletion  Dorsal Hand Severe depletion  Patellar Region Severe depletion  Anterior Thigh Region Severe depletion  Posterior Calf Region Severe depletion  Edema (RD Assessment) Mild  Hair Reviewed  Eyes Reviewed  Mouth Reviewed  Skin Reviewed  Nails Reviewed   Diet Order:   Diet Order             Diet Carb Modified Fluid consistency: Thin; Room service appropriate? Yes  Diet effective now                  EDUCATION NEEDS:   Education needs have been addressed  Skin:  Skin Assessment: Reviewed RN Assessment (neuropathic foot ulceration: right lateral foot, neuropathic foot ulceration: left lateral foot)  Last BM:  11/24- type 1  Height:   Ht Readings from Last 1 Encounters:  01/27/21 _0  (1.676 m)    Weight:   Wt Readings from Last 1 Encounters:  01/27/21 56.2 kg    Ideal Body Weight:  64.5 kg  BMI:  Body mass index is 20.01 kg/m.  Estimated Nutritional Needs:   Kcal:  1800-2100kcal/day  Protein:  90-105g/day  Fluid:  1.7-2.0L/day  Koleen Distance MS, RD, LDN Please refer to [facility] for RD and/or RD on-call/weekend/after hours pager

## 2021-01-29 NOTE — Consult Note (Signed)
I have placed a request via Secure Chat to Dr. Ramesh requesting photos of the wound areas of concern to be placed in the EMR.    Chayanne Speir MSN,RN,CWOCN, CNS, CWON-AP [phone]  

## 2021-01-29 NOTE — Consult Note (Signed)
Pharmacy Antibiotic Note  Cody Cowan is a 55 y.o. male admitted on 01/27/2021 with  wound infection .  Pharmacy has been consulted for cefepime and Vancomycin dosing.  This is Day 3 of Antibiotics  Plan:  Cefepime 2gm every 8 hrs  Will continue current regimen of Vancomycin 750 mg IV Q 12 hrs. Goal AUC 400-550. Expected AUC: 500 SCr used: 0.8 (actual Scr 0.53)  Height: 5\' 6"  (167.6 cm) Weight: 56.2 kg (124 lb) IBW/kg (Calculated) : 63.8  Temp (24hrs), Avg:98.4 F (36.9 C), Min:97.9 F (36.6 C), Max:98.8 F (37.1 C)  Recent Labs  Lab 01/27/21 1126 01/28/21 0626  WBC 3.6* 4.2  CREATININE 0.68 0.53*     Estimated Creatinine Clearance: 83.9 mL/min (A) (by C-G formula based on SCr of 0.53 mg/dL (L)).    No Known Allergies  Antimicrobials this admission: 11/23 Zosyn x1 11/23 Cefepime  >>  11/23 Vancomycin >>   Dose adjustments this admission: none  Microbiology results: 11/23 MRSA PCR: negative  Thank you for allowing pharmacy to be a part of this patient's care.  12/23, PharmD Clinical Pharmacist 01/29/2021 11:55 AM

## 2021-01-29 NOTE — Plan of Care (Signed)
Nutrition Education Note   RD consulted for nutrition education regarding diabetes.   55 y/o male with h/o etoh abuse, HLD, pancreatitis and DM who is admitted with non-healing diabetic foot wounds.  Lab Results  Component Value Date   HGBA1C 10.2 (H) 01/28/2021   Met with pt in room today using video interpreter  RD provided "Nutrition and Type II Diabetes" handout from the Academy of [employer] in [place]. Discussed different food groups and their effects on blood sugar, emphasizing carbohydrate-containing foods. Provided list of carbohydrates and recommended serving sizes of common foods.  Discussed importance of controlled and consistent carbohydrate intake throughout the day. Provided examples of ways to balance meals/snacks and encouraged intake of high-fiber, whole grain complex carbohydrates. Teach back method used.  Expect fair compliance.  Body mass index is 20.01 kg/m. Pt meets criteria for normal weight based on current BMI.  RD following this pt  Koleen Distance MS, RD, LDN Please refer to [facility] for RD and/or RD on-call/weekend/after hours pager

## 2021-01-30 DIAGNOSIS — E43 Unspecified severe protein-calorie malnutrition: Secondary | ICD-10-CM | POA: Insufficient documentation

## 2021-01-30 LAB — CBC
HCT: 31.2 % — ABNORMAL LOW (ref 39.0–52.0)
Hemoglobin: 10.9 g/dL — ABNORMAL LOW (ref 13.0–17.0)
MCH: 34.6 pg — ABNORMAL HIGH (ref 26.0–34.0)
MCHC: 34.9 g/dL (ref 30.0–36.0)
MCV: 99 fL (ref 80.0–100.0)
Platelets: 132 10*3/uL — ABNORMAL LOW (ref 150–400)
RBC: 3.15 MIL/uL — ABNORMAL LOW (ref 4.22–5.81)
RDW: 12.9 % (ref 11.5–15.5)
WBC: 4.2 10*3/uL (ref 4.0–10.5)
nRBC: 0 % (ref 0.0–0.2)

## 2021-01-30 LAB — BASIC METABOLIC PANEL
Anion gap: 5 (ref 5–15)
BUN: 13 mg/dL (ref 6–20)
CO2: 28 mmol/L (ref 22–32)
Calcium: 9.5 mg/dL (ref 8.9–10.3)
Chloride: 98 mmol/L (ref 98–111)
Creatinine, Ser: 0.58 mg/dL — ABNORMAL LOW (ref 0.61–1.24)
GFR, Estimated: >60 mL/min (ref >60)
Glucose, Bld: 204 mg/dL — ABNORMAL HIGH (ref 70–99)
Potassium: 4.2 mmol/L (ref 3.5–5.1)
Sodium: 131 mmol/L — ABNORMAL LOW (ref 135–145)

## 2021-01-30 LAB — GLUCOSE, CAPILLARY
Glucose-Capillary: 268 mg/dL — ABNORMAL HIGH (ref 70–99)
Glucose-Capillary: 270 mg/dL — ABNORMAL HIGH (ref 70–99)
Glucose-Capillary: 250 mg/dL — ABNORMAL HIGH (ref 70–99)
Glucose-Capillary: 340 mg/dL — ABNORMAL HIGH (ref 70–99)

## 2021-01-30 MED ORDER — CEPHALEXIN 500 MG PO CAPS
500.0000 mg | ORAL_CAPSULE | Freq: Four times a day (QID) | ORAL | Status: DC
  Administered 2021-01-30 – 2021-02-01 (×9): 500 mg via ORAL
  Filled 2021-01-30 (×9): qty 1

## 2021-01-30 NOTE — Plan of Care (Signed)

## 2021-01-30 NOTE — Progress Notes (Signed)
Dressings applied as ordered. Pt tolerated well. Will continue to monitor.

## 2021-01-30 NOTE — Progress Notes (Signed)
Pt's sister and pt's son at bedside. This RN provided diabetes and insulin education to pt, pt's sister, and pt's son via video spanish interpreter Suzette Battiest [phone]) for 1 hour regarding how to check blood sugar via CBG meter (which pt reports he has meter and supplies at home), signs and symptoms of hypo- and hyperglycemia, how to draw up insulin via syringe and vial, and how to administer insulin via syringe. Pt's son to be primary caregiver for insulin administration. Pt's son correctly demonstrated and verbalized understanding of how to draw up insulin appropriately via vial and syringe. Pt demonstrated appropriate sites for insulin injection. RN educated and encouraged two family members, when pt at home, to verify correct insulin unit dosage prior to administration to ensure correct insulin unit dose administration to pt. Pt, pt's sister, and pt's son verbalized understanding.   Pt has correctly self administered insulin multiple times throughout this shift (01/30/2021 0700-1900) with this RN's supervision.  Lanae Boast, MD at bedside for part of this diabetes and insulin teaching session.  Will continue to monitor.

## 2021-01-30 NOTE — Progress Notes (Signed)
PROGRESS NOTE    Cody Cowan  NAT:[id number] DOB: 07/06/1965 DOA: 01/27/2021 PCP: Patient, No Pcp Per (Inactive)   Chief Complaint  Patient presents with   Wound Infection  Brief Narrative/Hospital Course: Cody Cowan, 55 y.o. 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  To help with infection without improvement.  He has not been on any medication for his diabetes including oral medication or insulin for past couple of months due to not having insurance.Apparently the patient also had a fall from a ladder at work approximately 3 weeks ago. He endorses polyuria, polydipsia and states he is lost approximately 60 pounds.  He also endorses occasional diarrhea. ED Course: Lab work shows hyperglycemia, mild leukopenia with pseudohyponatremia and mildly elevated LFTs.  Does have some thrombocytopenia as well.  Labs also shows anemia and significantly elevated blood sugar at 527 with no anion gap or metabolic acidosis.  Left foot x-ray showed no acute findings.  Left pinky finger x-ray showed no acute findings.  He has been given 1 L of lactated Ringer's as a bolus in the emergency department.  Has also been given vancomycin and Zosyn for antimicrobial therapy.  Has been also given 8 units of NovoLog IV. Patient was placed on insulin regimen and oral meds, but given his poorly controlled HbA1c he will likely need to go home on insulin.  Seen by DM coordinator.  He has been educated on insulin injection.  Subjective: Seen this morning resting comfortably foot wounds are dressed.  Assessment & Plan:  Type 2 diabetes mellitus with uncontrolled hyperglycemia: Blood are fluctuating 128 - 270s.  On 70/30 insulin  25 units twice daily, received extra 6 units from sliding scale, on metformin and linagliptin.  Continue current regimen SSI, continue on education for diabetes mellitus, hypoglycemia insulin injection CBG check.  We will need to make sure patient also well informed, will appreciate if DM coordinator can ensure all this are met before we discharged him on insulin regimen given his poor education/lack of knowledge Recent Labs  Lab 01/29/21 1154 01/29/21 1634 01/29/21 2112 01/30/21 0728 01/30/21 1143  GLUCAP 267* 133* 128* 268* 270*     Left foot with diabetic ulcer: X-ray shows no osteomyelitis, checked ABI- Resting ABI the bilateral lower extremity likely falsely elevated,potentially secondary to noncompressible vessels/medial calcinosis.Segmental Doppler at the bilateral ankles demonstrates waveforms relatively maintained.  Continue antibiotics- changing to po Keflex today. ?podaitry eval  Elevated LFTs mild monitor follow-up hepatitis serologies.upper quadrant ultrasound no acute finding.  Hypertriglyceridemia: Repeat fasting lipid profile with TG < 500.  Currently manage diabetes, may consider Meds OP or on D/C.  Abnormal thyroid function with TSH elevated at 1.5 normal free T4 recheck in 3 weeks  DVT prophylaxis: enoxaparin (LOVENOX) injection 40 mg Start: 01/27/21 2200 Code Status:   Code Status: Full Code Family Communication: plan of care discussed with patient at bedside. Status is: Inpatient  Remains hospitalized for ongoing management of his uncontrolled hyperglycemia, and further titration of this medication.   Disposition: Currently Not medically stable for discharge. Anticipated Disposition: Home   Objective: Vitals last 24 hrs: Vitals:   01/29/21 1510 01/29/21 2008 01/30/21 0515 01/30/21 0730  BP: 116/76 114/74 137/87 (!) 142/90  Pulse: 83 91 94 85  Resp: '18 16 17 18  ' Temp: (!) 97.5 F (36.4 C) 98.1 F (36.7 C) 98.1 F (36.7 C) 99.1 F (37.3 C)  TempSrc: Oral  Oral  Oral  SpO2: 99% 99% 100% 100%  Weight:      Height:       Weight change:   Intake/Output Summary (Last 24 hours) at 01/30/2021 1355 Last data filed at 01/30/2021 1200 Gross per 24 hour  Intake 1225 ml  Output --  Net 1225 ml    Net IO Since Admission: 2,533.04 mL [01/30/21 1355]   Physical Examination: General exam: AAOx 3, pleasant, not in distress,weak appearing. HEENT:Oral mucosa moist, Ear/Nose WNL grossly, dentition normal. Respiratory system: bilaterally clear, no use of accessory muscle Cardiovascular system: S1 & S2 +, No JVD,. Gastrointestinal system: Abdomen soft,NT,ND, BS+ Nervous System:Alert, awake, moving extremities and grossly nonfocal Extremities: No edema, distal peripheral pulses palpable.  Skin: No rashes,no icterus. MSK: Normal muscle bulk,tone, power       Medications reviewed:  Scheduled Meds:  cephALEXin  500 mg Oral Q6H   enoxaparin (LOVENOX) injection  40 mg Subcutaneous QHS   feeding supplement (GLUCERNA SHAKE)  237 mL Oral TID BM   insulin aspart  0-5 Units Subcutaneous QHS   insulin aspart  0-9 Units Subcutaneous TID WC   insulin aspart protamine- aspart  25 Units Subcutaneous BID WC   linagliptin  5 mg Oral Daily   living well with diabetes book- in spanish   Does not apply Once   metFORMIN  500 mg Oral BID WC   multivitamin with minerals  1 tablet Oral Daily   Continuous Infusions:    Diet Order             Diet Carb Modified Fluid consistency: Thin; Room service appropriate? Yes  Diet effective now                   Weight change:   Wt Readings from Last 3 Encounters:  01/27/21 56.2 kg  01/12/20 67.1 kg     Consultants:see note  Procedures:see note Antimicrobials: Anti-infectives (From admission, onward)    Start     Dose/Rate Route Frequency Ordered Stop   01/30/21 1200  cephALEXin (KEFLEX) capsule 500 mg        500 mg Oral Every 6 hours 01/30/21 1008     01/28/21 0100  vancomycin (VANCOREADY) IVPB 750  mg/150 mL  Status:  Discontinued        750 mg 150 mL/hr over 60 Minutes Intravenous Every 12 hours 01/27/21 1707 01/30/21 1008   01/27/21 2200  ceFEPIme (MAXIPIME) 2 g in sodium chloride 0.9 % 100 mL IVPB  Status:  Discontinued        2 g 200 mL/hr over 30 Minutes Intravenous Every 8 hours 01/27/21 1707 01/30/21 1007   01/27/21 1315  piperacillin-tazobactam (ZOSYN) IVPB 3.375 g        3.375 g 12.5 mL/hr over 240 Minutes Intravenous  Once 01/27/21 1304 01/27/21 1400   01/27/21 1300  piperacillin-tazobactam (ZOSYN) IVPB 3.375 g  Status:  Discontinued        3.375 g 18.75 mL/hr over 240 Minutes Intravenous  Once 01/27/21 1258 01/27/21 1304   01/27/21 1300  vancomycin (VANCOCIN) IVPB 1000 mg/200 mL premix        1,000 mg 200 mL/hr over 60 Minutes Intravenous  Once 01/27/21 1258 01/27/21 1624      Culture/Microbiology No results found for: SDES, SPECREQUEST, CULT, REPTSTATUS  Other culture-see note  Unresulted Labs (From admission, onward)    None     Data Reviewed: I have personally reviewed following labs and imaging studies CBC: Recent Labs  Lab 01/27/21 1126 01/28/21 0626 01/30/21 0406  WBC 3.6* 4.2 4.2  NEUTROABS 2.0  --   --   HGB 11.8* 11.1* 10.9*  HCT 34.4* 31.5* 31.2*  MCV 99.7 98.1 99.0  PLT 106* 105* 132*    Basic Metabolic Panel: Recent Labs  Lab 01/27/21 1126 01/28/21 0626 01/30/21 0406  NA 126* 130* 131*  K 4.8 3.6 4.2  CL 92* 97* 98  CO2 '27 28 28  ' GLUCOSE 527* 217* 204*  BUN '13 11 13  ' CREATININE 0.68 0.53* 0.58*  CALCIUM 9.2 8.8* 9.5    GFR: Estimated Creatinine Clearance: 83.9 mL/min (A) (by C-G formula based on SCr of 0.58 mg/dL (L)). Liver Function Tests: Recent Labs  Lab 01/27/21 1126 01/28/21 0626  AST 76* 54*  ALT 56* 42  ALKPHOS 416* 274*  BILITOT 1.3* 1.2  PROT 7.4 6.5  ALBUMIN 3.3* 2.8*    No results for input(s): LIPASE, AMYLASE in the last 168 hours. No results for input(s): AMMONIA in the last 168 hours. Coagulation  Profile: No results for input(s): INR, PROTIME in the last 168 hours. Cardiac Enzymes: No results for input(s): CKTOTAL, CKMB, CKMBINDEX, TROPONINI in the last 168 hours. BNP (last 3 results) No results for input(s): PROBNP in the last 8760 hours. HbA1C: Recent Labs    01/28/21 0626  HGBA1C 10.2*    CBG: Recent Labs  Lab 01/29/21 1154 01/29/21 1634 01/29/21 2112 01/30/21 0728 01/30/21 1143  GLUCAP 267* 133* 128* 268* 270*    Lipid Profile: Recent Labs    01/28/21 0626 01/29/21 0527  CHOL 345* 313*  HDL 18* 23*  LDLCALC UNABLE TO CALCULATE IF TRIGLYCERIDE OVER 400 mg/dL UNABLE TO CALCULATE IF TRIGLYCERIDE OVER 400 mg/dL  TRIG 682* 487*  CHOLHDL 19.2 13.6  LDLDIRECT 191.0* 195.0*    Thyroid Function Tests: Recent Labs    01/28/21 0626 01/29/21 0855  TSH 9.501*  --   FREET4  --  0.82    Anemia Panel: No results for input(s): VITAMINB12, FOLATE, FERRITIN, TIBC, IRON, RETICCTPCT in the last 72 hours. Sepsis Labs: No results for input(s): PROCALCITON, LATICACIDVEN in the last 168 hours.  Recent Results (from the past 240 hour(s))  Resp Panel by RT-PCR (Flu A&B, Covid) Nasopharyngeal Swab     Status: None   Collection Time: 01/27/21  2:10 PM   Specimen: Nasopharyngeal Swab; Nasopharyngeal(NP) swabs in vial transport medium  Result Value Ref Range Status   SARS Coronavirus 2 by RT PCR NEGATIVE NEGATIVE Final    Comment: (NOTE) SARS-CoV-2 target nucleic acids are NOT DETECTED.  The SARS-CoV-2 RNA is generally detectable in upper respiratory specimens during the acute phase of infection. The lowest concentration of SARS-CoV-2 viral copies this assay can detect is 138 copies/mL. A negative result does not preclude SARS-Cov-2 infection and should not be used as the sole basis for treatment or other patient management decisions. A negative result may occur with  improper specimen collection/handling, submission of specimen other than nasopharyngeal swab, presence  of viral mutation(s) within the areas targeted by this assay, and inadequate number of viral copies(<138 copies/mL). A negative result must be combined with clinical observations, patient history, and epidemiological information. The expected result is Negative.  Fact Sheet for Patients:  EntrepreneurPulse.com.au  Fact Sheet for Healthcare Providers:  IncredibleEmployment.be  This test is no t yet approved  or cleared by the [place] and  has been authorized for detection and/or diagnosis of SARS-CoV-2 by FDA under an Emergency Use Authorization (EUA). This EUA will remain  in effect (meaning this test can be used) for the duration of the COVID-19 declaration under Section 564(b)(1) of the Act, 21 U.S.C.section 360bbb-3(b)(1), unless the authorization is terminated  or revoked sooner.       Influenza A by PCR NEGATIVE NEGATIVE Final   Influenza B by PCR NEGATIVE NEGATIVE Final    Comment: (NOTE) The Xpert Xpress SARS-CoV-2/FLU/RSV plus assay is intended as an aid in the diagnosis of influenza from Nasopharyngeal swab specimens and should not be used as a sole basis for treatment. Nasal washings and aspirates are unacceptable for Xpert Xpress SARS-CoV-2/FLU/RSV testing.  Fact Sheet for Patients: EntrepreneurPulse.com.au  Fact Sheet for Healthcare Providers: IncredibleEmployment.be  This test is not yet approved or cleared by the [place] FDA and has been authorized for detection and/or diagnosis of SARS-CoV-2 by FDA under an Emergency Use Authorization (EUA). This EUA will remain in effect (meaning this test can be used) for the duration of the COVID-19 declaration under Section 564(b)(1) of the Act, 21 U.S.C. section 360bbb-3(b)(1), unless the authorization is terminated or revoked.  Performed at [facility], [address] [place], [place] [address]   MRSA Next Gen by PCR,  Nasal     Status: None   Collection Time: 01/27/21  6:08 PM   Specimen: Nasal Mucosa; Nasal Swab  Result Value Ref Range Status   MRSA by PCR Next Gen NOT DETECTED NOT DETECTED Final    Comment: (NOTE) The GeneXpert MRSA Assay (FDA approved for NASAL specimens only), is one component of a comprehensive MRSA colonization surveillance program. It is not intended to diagnose MRSA infection nor to guide or monitor treatment for MRSA infections. Test performance is not FDA approved in patients less than 16 years old. Performed at [facility], [address] [place], [place] [address]       Radiology Studies: No results found.   LOS: 1 day   Antonieta Pert, MD [facility]  01/30/2021, 1:55 PM

## 2021-01-31 LAB — GLUCOSE, CAPILLARY
Glucose-Capillary: 275 mg/dL — ABNORMAL HIGH (ref 70–99)
Glucose-Capillary: 172 mg/dL — ABNORMAL HIGH (ref 70–99)
Glucose-Capillary: 143 mg/dL — ABNORMAL HIGH (ref 70–99)
Glucose-Capillary: 132 mg/dL — ABNORMAL HIGH (ref 70–99)

## 2021-01-31 MED ORDER — INSULIN ASPART PROT & ASPART (70-30 MIX) 100 UNIT/ML ~~LOC~~ SUSP
30.0000 [IU] | Freq: Two times a day (BID) | SUBCUTANEOUS | Status: DC
  Administered 2021-01-31 – 2021-02-01 (×3): 30 [IU] via SUBCUTANEOUS
  Filled 2021-01-31 (×2): qty 10

## 2021-01-31 NOTE — Progress Notes (Signed)
Dressings changed as ordered. Pt tolerated well. Pt verbalized understanding of dressing change to heels. All questions answered to satisfaction. Will continue to monitor.

## 2021-01-31 NOTE — Plan of Care (Signed)
  Problem: Education: Goal: Knowledge of General Education information will improve Description: Including pain rating scale, medication(s)/side effects and non-pharmacologic comfort measures Outcome: Progressing   Problem: Health Behavior/Discharge Planning: Goal: Ability to manage health-related needs will improve Outcome: Progressing   Problem: Clinical Measurements: Goal: Ability to maintain clinical measurements within normal limits will improve Outcome: Progressing Goal: Will remain free from infection Outcome: Progressing   Problem: Activity: Goal: Risk for activity intolerance will decrease Outcome: Progressing   Problem: Nutrition: Goal: Adequate nutrition will be maintained Outcome: Progressing   Problem: Elimination: Goal: Will not experience complications related to bowel motility Outcome: Progressing Goal: Will not experience complications related to urinary retention Outcome: Progressing   Problem: Pain Managment: Goal: General experience of comfort will improve Outcome: Progressing   Problem: Safety: Goal: Ability to remain free from injury will improve Outcome: Progressing   Problem: Skin Integrity: Goal: Risk for impaired skin integrity will decrease Outcome: Progressing   Problem: Fluid Volume: Goal: Ability to maintain a balanced intake and output will improve Outcome: Progressing   Problem: Health Behavior/Discharge Planning: Goal: Ability to identify and utilize available resources and services will improve Outcome: Progressing Goal: Ability to manage health-related needs will improve Outcome: Progressing   Problem: Metabolic: Goal: Ability to maintain appropriate glucose levels will improve Outcome: Progressing

## 2021-01-31 NOTE — TOC Progression Note (Signed)
Transition of Care [facility]) - Progression Note    Patient Details  Name: Cody Cowan MRN: [id number] Date of Birth: September 15, 1965  [facility] CM/SW Contact  Bing Quarry, RN Phone Number: 01/31/2021, 12:24 PM  Clinical Narrative:  Spanish speaking patient. IV Abx changed to PO. Unit RN did extensive education on insulin administration but patient missing in index finger portion and has vision issues. Son also was instructed with return demonstration per Unit RN. Patient uninsured with no PCP. Discharge anticipated Monday 11/28 and will need medication management/[facility]/diabetes resources. Family speaks some, but little [place]. Provider indicates will order 70/30 Relion pen for easier/safer insulin administration.      Expected Discharge Plan: Home w Home Health Services Barriers to Discharge: Inadequate or no insurance, Continued Medical Work up  Expected Discharge Plan and Services Expected Discharge Plan: Home w Home Health Services   Discharge Planning Services: Medication Assistance, Indigent Health Clinic   Living arrangements for the past 2 months: Single Family Home                                       Social Determinants of Health (SDOH) Interventions    Readmission Risk Interventions No flowsheet data found.

## 2021-01-31 NOTE — Progress Notes (Signed)
PROGRESS NOTE    Cody Cowan  NWG:[id number] DOB: 09/26/1965 DOA: 01/27/2021 PCP: Patient, No Pcp Per (Inactive)   Chief Complaint  Patient presents with   Wound Infection  Brief Narrative/Hospital Course: Cody Cowan, 55 y.o. 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  To help with infection without improvement.  He has not been on any medication for his diabetes including oral medication or insulin for past couple of months due to not having insurance.Apparently the patient also had a fall from a ladder at work approximately 3 weeks ago. He endorses polyuria, polydipsia and states he is lost approximately 60 pounds.  He also endorses occasional diarrhea. ED Course: Lab work shows hyperglycemia, mild leukopenia with pseudohyponatremia and mildly elevated LFTs.  Does have some thrombocytopenia as well.  Labs also shows anemia and significantly elevated blood sugar at 527 with no anion gap or metabolic acidosis.  Left foot x-ray showed no acute findings.  Left pinky finger x-ray showed no acute findings.  He has been given 1 L of lactated Ringer's as a bolus in the emergency department.  Has also been given vancomycin and Zosyn for antimicrobial therapy.  Has been also given 8 units of NovoLog IV. Patient was placed on insulin regimen and oral meds, but given his poorly controlled HbA1c he will likely need to go home on insulin.  Seen by DM coordinator.  He has been educated on insulin injection.  Subjective: Seen examined Resting Sugar not controlled  Assessment & Plan:  Type 2 diabetes mellitus with uncontrolled hyperglycemia: Blood sugar poorly controlled increase 70/30 insulin to 30 units twice  daily, keep on SSI patient and family have been well educated regarding insulin injection, hyperglycemia and diabetes care.  Discussed with TOC will plan for same discharge today, planning for discharge tomorrow with resources in place, PCP follow-up and med assistance given he has no insurance Recent Labs  Lab 01/30/21 0728 01/30/21 1143 01/30/21 1626 01/30/21 2017 01/31/21 0733  GLUCAP 268* 270* 250* 340* 275*    Left foot with diabetic ulcer: X-ray shows no osteomyelitis, checked ABI- Resting ABI the bilateral lower extremity likely falsely elevated,potentially secondary to noncompressible vessels/medial calcinosis.Segmental Doppler at the bilateral ankles demonstrates waveforms relatively maintained.  Continue oral Keflex.  OP podiatry follow up  Elevated LFTs mild monitor follow-up hepatitis serologies.upper quadrant ultrasound no acute finding.  Hypertriglyceridemia: Repeat fasting lipid profile with TG < 500.  Currently manage diabetes, to resume fibrates on D/C.  Abnormal thyroid function with TSH elevated at 1.5 normal free T4 recheck in 3 weeks  DVT prophylaxis: enoxaparin (LOVENOX) injection 40 mg Start: 01/27/21 2200 Code Status:   Code Status: Full Code Family Communication: plan of care discussed with patient at bedside. Status is: Inpatient  Remains hospitalized for ongoing management of his uncontrolled hyperglycemia, and further titration of this medication.   Disposition: Currently Not medically stable for discharge. Anticipated Disposition: Home TOMORROW   Objective: Vitals last 24 hrs: Vitals:   01/30/21 1555 01/30/21 1927 01/31/21 0432 01/31/21 0734  BP: 137/82 109/73 120/77 134/89  Pulse: 94 (!) 101 100 85  Resp: 18 16 16 18   Temp: 98.1 F (36.7 C) 99.2 F (37.3 C) 99 F (37.2 C) 98.2 F (36.8 C)  TempSrc:  Oral Oral Oral Oral  SpO2: 99% 98% 98% 100%  Weight:      Height:       Weight change:   Intake/Output Summary (Last 24 hours) at 01/31/2021  1125 Last data filed at 01/31/2021 1013 Gross per 24 hour  Intake 1040 ml  Output --  Net 1040 ml   Net IO Since Admission: 3,473.04 mL [01/31/21 1125]   Physical Examination: General exam: AAOx 3, older than stated age, weak appearing. HEENT:Oral mucosa moist, Ear/Nose WNL grossly, dentition normal. Respiratory system: bilaterally diminished, no use of accessory muscle Cardiovascular system: S1 & S2 +, No JVD,. Gastrointestinal system: Abdomen soft,NT,ND, BS+ Nervous System:Alert, awake, moving extremities and grossly nonfocal Extremities: No edema, distal peripheral pulses palpable.  Skin: No rashes,no icterus. MSK: Normal muscle bulk,tone, power   Medications reviewed:  Scheduled Meds:  cephALEXin  500 mg Oral Q6H   enoxaparin (LOVENOX) injection  40 mg Subcutaneous QHS   feeding supplement (GLUCERNA SHAKE)  237 mL Oral TID BM   insulin aspart  0-5 Units Subcutaneous QHS   insulin aspart  0-9 Units Subcutaneous TID WC   insulin aspart protamine- aspart  30 Units Subcutaneous BID WC   linagliptin  5 mg Oral Daily   metFORMIN  500 mg Oral BID WC   multivitamin with minerals  1 tablet Oral Daily   Continuous Infusions:    Diet Order             Diet Carb Modified Fluid consistency: Thin; Room service appropriate? Yes  Diet effective now                   Weight change:   Wt Readings from Last 3 Encounters:  01/27/21 56.2 kg  01/12/20 67.1 kg     Consultants:see note  Procedures:see note Antimicrobials: Anti-infectives (From admission, onward)    Start     Dose/Rate Route Frequency Ordered Stop   01/30/21 1200  cephALEXin (KEFLEX) capsule 500 mg        500 mg Oral Every 6 hours 01/30/21 1008     01/28/21 0100  vancomycin (VANCOREADY) IVPB 750 mg/150 mL  Status:  Discontinued        750 mg 150 mL/hr over 60 Minutes Intravenous Every 12 hours 01/27/21 1707 01/30/21 1008   01/27/21 2200  ceFEPIme (MAXIPIME) 2 g in sodium chloride 0.9 % 100 mL IVPB  Status:   Discontinued        2 g 200 mL/hr over 30 Minutes Intravenous Every 8 hours 01/27/21 1707 01/30/21 1007   01/27/21 1315  piperacillin-tazobactam (ZOSYN) IVPB 3.375 g        3.375 g 12.5 mL/hr over 240 Minutes Intravenous  Once 01/27/21 1304 01/27/21 1400   01/27/21 1300  piperacillin-tazobactam (ZOSYN) IVPB 3.375 g  Status:  Discontinued        3.375 g 18.75 mL/hr over 240 Minutes Intravenous  Once 01/27/21 1258 01/27/21 1304   01/27/21 1300  vancomycin (VANCOCIN) IVPB 1000 mg/200 mL premix        1,000 mg 200 mL/hr over 60 Minutes Intravenous  Once 01/27/21 1258 01/27/21 1624      Culture/Microbiology No results found for: SDES, SPECREQUEST, CULT, REPTSTATUS  Other culture-see note  Unresulted Labs (From admission, onward)    None     Data Reviewed: I have personally reviewed following labs and imaging studies CBC: Recent Labs  Lab 01/27/21 1126 01/28/21 0626 01/30/21 0406  WBC 3.6* 4.2 4.2  NEUTROABS 2.0  --   --  HGB 11.8* 11.1* 10.9*  HCT 34.4* 31.5* 31.2*  MCV 99.7 98.1 99.0  PLT 106* 105* 132*   Basic Metabolic Panel: Recent Labs  Lab 01/27/21 1126 01/28/21 0626 01/30/21 0406  NA 126* 130* 131*  K 4.8 3.6 4.2  CL 92* 97* 98  CO2 27 28 28   GLUCOSE 527* 217* 204*  BUN 13 11 13   CREATININE 0.68 0.53* 0.58*  CALCIUM 9.2 8.8* 9.5   GFR: Estimated Creatinine Clearance: 83.9 mL/min (A) (by C-G formula based on SCr of 0.58 mg/dL (L)). Liver Function Tests: Recent Labs  Lab 01/27/21 1126 01/28/21 0626  AST 76* 54*  ALT 56* 42  ALKPHOS 416* 274*  BILITOT 1.3* 1.2  PROT 7.4 6.5  ALBUMIN 3.3* 2.8*   No results for input(s): LIPASE, AMYLASE in the last 168 hours. No results for input(s): AMMONIA in the last 168 hours. Coagulation Profile: No results for input(s): INR, PROTIME in the last 168 hours. Cardiac Enzymes: No results for input(s): CKTOTAL, CKMB, CKMBINDEX, TROPONINI in the last 168 hours. BNP (last 3 results) No results for input(s): PROBNP  in the last 8760 hours. HbA1C: No results for input(s): HGBA1C in the last 72 hours. CBG: Recent Labs  Lab 01/30/21 0728 01/30/21 1143 01/30/21 1626 01/30/21 2017 01/31/21 0733  GLUCAP 268* 270* 250* 340* 275*   Lipid Profile: Recent Labs    01/29/21 0527  CHOL 313*  HDL 23*  LDLCALC UNABLE TO CALCULATE IF TRIGLYCERIDE OVER 400 mg/dL  TRIG 02/02/21*  CHOLHDL 01/31/21  LDLDIRECT 195.0*   Thyroid Function Tests: Recent Labs    01/29/21 0855  FREET4 0.82   Anemia Panel: No results for input(s): VITAMINB12, FOLATE, FERRITIN, TIBC, IRON, RETICCTPCT in the last 72 hours. Sepsis Labs: No results for input(s): PROCALCITON, LATICACIDVEN in the last 168 hours.  Recent Results (from the past 240 hour(s))  Resp Panel by RT-PCR (Flu A&B, Covid) Nasopharyngeal Swab     Status: None   Collection Time: 01/27/21  2:10 PM   Specimen: Nasopharyngeal Swab; Nasopharyngeal(NP) swabs in vial transport medium  Result Value Ref Range Status   SARS Coronavirus 2 by RT PCR NEGATIVE NEGATIVE Final    Comment: (NOTE) SARS-CoV-2 target nucleic acids are NOT DETECTED.  The SARS-CoV-2 RNA is generally detectable in upper respiratory specimens during the acute phase of infection. The lowest concentration of SARS-CoV-2 viral copies this assay can detect is 138 copies/mL. A negative result does not preclude SARS-Cov-2 infection and should not be used as the sole basis for treatment or other patient management decisions. A negative result may occur with  improper specimen collection/handling, submission of specimen other than nasopharyngeal swab, presence of viral mutation(s) within the areas targeted by this assay, and inadequate number of viral copies(<138 copies/mL). A negative result must be combined with clinical observations, patient history, and epidemiological information. The expected result is Negative.  Fact Sheet for Patients:  01/31/21  Fact Sheet for  Healthcare Providers:  01/29/21  This test is no t yet approved or cleared by the [employer] FDA and  has been authorized for detection and/or diagnosis of SARS-CoV-2 by FDA under an Emergency Use Authorization (EUA). This EUA will remain  in effect (meaning this test can be used) for the duration of the COVID-19 declaration under Section 564(b)(1) of the Act, 21 U.S.C.section 360bbb-3(b)(1), unless the authorization is terminated  or revoked sooner.       Influenza A by PCR NEGATIVE NEGATIVE Final   Influenza B by PCR NEGATIVE NEGATIVE  Final    Comment: (NOTE) The Xpert Xpress SARS-CoV-2/FLU/RSV plus assay is intended as an aid in the diagnosis of influenza from Nasopharyngeal swab specimens and should not be used as a sole basis for treatment. Nasal washings and aspirates are unacceptable for Xpert Xpress SARS-CoV-2/FLU/RSV testing.  Fact Sheet for Patients: [employer]  Fact Sheet for Healthcare Providers: [employer]it  This test is not yet approved or cleared by the [place] FDA and has been authorized for detection and/or diagnosis of SARS-CoV-2 by FDA under an Emergency Use Authorization (EUA). This EUA will remain in effect (meaning this test can be used) for the duration of the COVID-19 declaration under Section 564(b)(1) of the Act, 21 U.S.C. section 360bbb-3(b)(1), unless the authorization is terminated or revoked.  Performed at [facility], [address] [place], [place] [address]   MRSA Next Gen by PCR, Nasal     Status: None   Collection Time: 01/27/21  6:08 PM   Specimen: Nasal Mucosa; Nasal Swab  Result Value Ref Range Status   MRSA by PCR Next Gen NOT DETECTED NOT DETECTED Final    Comment: (NOTE) The GeneXpert MRSA Assay (FDA approved for NASAL specimens only), is one component of a comprehensive MRSA colonization surveillance program. It is  not intended to diagnose MRSA infection nor to guide or monitor treatment for MRSA infections. Test performance is not FDA approved in patients less than 89 years old. Performed at [facility], [address] [place], [place] [address]       Radiology Studies: No results found.   LOS: 2 days   Lanae Boast, MD [facility]  01/31/2021, 11:25 AM

## 2021-01-31 NOTE — Plan of Care (Signed)
  Problem: Clinical Measurements: Goal: Will remain free from infection Outcome: Not Progressing   Problem: Nutrition: Goal: Adequate nutrition will be maintained Outcome: Not Progressing   Problem: Coping: Goal: Level of anxiety will decrease Outcome: Not Progressing   Problem: Pain Managment: Goal: General experience of comfort will improve Outcome: Not Progressing

## 2021-02-01 ENCOUNTER — Other Ambulatory Visit: Payer: Self-pay

## 2021-02-01 LAB — GLUCOSE, CAPILLARY
Glucose-Capillary: 309 mg/dL — ABNORMAL HIGH (ref 70–99)
Glucose-Capillary: 309 mg/dL — ABNORMAL HIGH (ref 70–99)

## 2021-02-01 MED ORDER — NOVOLIN 70/30 FLEXPEN RELION (70-30) 100 UNIT/ML ~~LOC~~ SUPN
30.0000 [IU] | PEN_INJECTOR | Freq: Two times a day (BID) | SUBCUTANEOUS | 1 refills | Status: DC
  Filled 2021-02-01: qty 18, 30d supply, fill #0

## 2021-02-01 MED ORDER — LINAGLIPTIN 5 MG PO TABS
5.0000 mg | ORAL_TABLET | Freq: Every day | ORAL | 0 refills | Status: DC
Start: 2021-02-01 — End: 2021-02-01
  Filled 2021-02-01: qty 30, 30d supply, fill #0

## 2021-02-01 MED ORDER — INSULIN LISPRO PROT & LISPRO (75-25 MIX) 100 UNIT/ML KWIKPEN
30.0000 [IU] | PEN_INJECTOR | Freq: Two times a day (BID) | SUBCUTANEOUS | 1 refills | Status: DC
  Filled 2021-02-01: qty 15, 25d supply, fill #0

## 2021-02-01 MED ORDER — FENOFIBRATE 145 MG PO TABS
145.0000 mg | ORAL_TABLET | Freq: Every day | ORAL | 0 refills | Status: DC
Start: 2021-02-01 — End: 2021-03-02
  Filled 2021-02-01: qty 30, 30d supply, fill #0

## 2021-02-01 MED ORDER — METFORMIN HCL ER 500 MG PO TB24
500.0000 mg | ORAL_TABLET | Freq: Two times a day (BID) | ORAL | 0 refills | Status: DC
  Filled 2021-02-01: qty 60, 30d supply, fill #0

## 2021-02-01 MED ORDER — CEPHALEXIN 500 MG PO CAPS
500.0000 mg | ORAL_CAPSULE | Freq: Four times a day (QID) | ORAL | 0 refills | Status: AC
  Filled 2021-02-01: qty 28, 7d supply, fill #0

## 2021-02-01 MED ORDER — INSULIN PEN NEEDLE 32G X 4 MM MISC
1.0000 | Freq: Two times a day (BID) | 0 refills | Status: DC
  Filled 2021-02-01: qty 100, 50d supply, fill #0

## 2021-02-01 NOTE — Progress Notes (Signed)
Pt discharged per MD order. IVs removed. Discharge orders explained to pt via interpreter. Pt verbalized understanding. Pts medications deliverd from medication management. Wound supplies sent home with pt. All pts questions answered to satisfaction. Pt taken to car in wheelchair by staff.

## 2021-02-01 NOTE — TOC Transition Note (Signed)
Transition of Care [facility]) - CM/SW Discharge Note   Patient Details  Name: Cody Cowan MRN: [id number] Date of Birth: 21-May-1965  Transition of Care [facility]) CM/SW Contact:  Margarito Liner, LCSW Phone Number: 02/01/2021, 2:33 PM   Clinical Narrative: Patient has orders to discharge home today. Medications will be ready in about 10 minutes. Asked volunteer services to pick up and bring to the room. Glucometer brought to the room. No further concerns. CSW signing off.    Final next level of care: Home/Self Care Barriers to Discharge: Barriers Resolved   Patient Goals and CMS Choice Patient states their goals for this hospitalization and ongoing recovery are:: Left foot pain with open wound and dizziness      Discharge Placement                Patient to be transferred to facility by: Nephew will take him home.   Patient and family notified of of transfer: 02/01/21  Discharge Plan and Services   Discharge Planning Services: Medication Assistance, [facility]                                 Social Determinants of Health (SDOH) Interventions     Readmission Risk Interventions No flowsheet data found.

## 2021-02-01 NOTE — Progress Notes (Signed)
   02/01/21 0759  Assess: MEWS Score  Temp 98.2 F (36.8 C)  BP (!) 85/64 (RN Mckinnon Glick notified)  Pulse Rate (!) 107 (RN Milcah Dulany notified)  Resp 17  SpO2 100 %  Assess: MEWS Score  MEWS Temp 0  MEWS Systolic 1  MEWS Pulse 1  MEWS RR 0  MEWS LOC 0  MEWS Score 2  MEWS Score Color Yellow  Assess: if the MEWS score is Yellow or Red  Were vital signs taken at a resting state? Yes  Focused Assessment No change from prior assessment  Does the patient meet 2 or more of the SIRS criteria? No  MEWS guidelines implemented *See Row Information* Yes  Take Vital Signs  Increase Vital Sign Frequency  Yellow: Q 2hr X 2 then Q 4hr X 2, if remains yellow, continue Q 4hrs  Escalate  MEWS: Escalate Yellow: discuss with charge nurse/RN and consider discussing with provider and RRT  Notify: Charge Nurse/RN  Name of Charge Nurse/RN Notified Susy Manor  Date Charge Nurse/RN Notified 02/01/21  Time Charge Nurse/RN Notified 0800  Document  Patient Outcome Other (Comment)  Progress note created (see row info) Yes  Assess: SIRS CRITERIA  SIRS Temperature  0  SIRS Pulse 1  SIRS Respirations  0  SIRS WBC 0  SIRS Score Sum  1

## 2021-02-01 NOTE — TOC Progression Note (Signed)
Transition of Care [facility]) - Progression Note    Patient Details  Name: Cody Cowan MRN: [id number] Date of Birth: 1966/02/26  [facility] of [facility]) CM/SW Contact  Margarito Liner, LCSW Phone Number: 02/01/2021, 1:25 PM  Clinical Narrative: Spoke to patient with interpreter Aarel [phone]). Patient confirmed he does not have a PCP. Gave packet for free/low-cost healthcare in [facility] and intake paperwork for [employer]. Pres[facility]. They will call CSW when ready for pickup.    Expected Discharge Plan: Home w Home Health Services Barriers to Discharge: Inadequate or no insurance, Continued Medical Work up  Expected Discharge Plan and Services Expected Discharge Plan: Home w Home Health Services   Discharge Planning Services: Medication Assistance, [facility]   Living arrangements for the past 2 months: Single Family Home Expected Discharge Date: 02/01/21                                     Social Determinants of Health (SDOH) Interventions    Readmission Risk Interventions No flowsheet data found.

## 2021-02-01 NOTE — Plan of Care (Signed)
  Problem: Clinical Measurements: Goal: Will remain free from infection Outcome: Not Progressing   Problem: Coping: Goal: Level of anxiety will decrease Outcome: Not Progressing   Problem: Skin Integrity: Goal: Risk for impaired skin integrity will decrease Outcome: Not Progressing

## 2021-02-01 NOTE — Discharge Summary (Addendum)
Physician Discharge Summary  Cody Cowan WGN:[id number] DOB: September 29, 1965 DOA: 01/27/2021  PCP: Patient, No Pcp Per (Inactive)  Admit date: 01/27/2021 Discharge date: 02/01/2021  Admitted From: home Disposition:  home  Recommendations for Outpatient Follow-up:  Follow up with [facility]  in 1-2 weeks, w/. Podiatry  in 2 wk-call the no Please obtain BMP/CBC in one week  Home Health:no  Equipment/Devices: none  Discharge Condition: Stable Code Status:   Code Status: Full Code Diet recommendation:  Diet Order             Diet Carb Modified Fluid consistency: Thin; Room service appropriate? Yes  Diet effective now                   Brief/Interim Summary:  55 y.o. 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  To help with infection without improvement.  He has not been on any medication for his diabetes including oral medication or insulin for past couple of months due to not having insurance.Apparently the patient also had a fall from a ladder at work approximately 3 weeks ago. He endorses polyuria, polydipsia and states he is lost approximately 60 pounds.  He also endorses occasional diarrhea. ED Course: Lab work shows hyperglycemia, mild leukopenia with pseudohyponatremia and mildly elevated LFTs.  Does have some thrombocytopenia as well.  Labs also shows anemia and significantly elevated blood sugar at 527 with no anion gap or metabolic acidosis.  Left foot x-ray showed no acute findings.  Left pinky finger x-ray showed no acute findings.  He has been given 1 L of lactated Ringer's as a bolus in the emergency department.  Has also been given vancomycin and Zosyn for antimicrobial therapy.  Has  been also given 8 units of NovoLog IV. Patient was placed on insulin regimen and oral meds, but given his poorly controlled HbA1c he will likely need to go home on insulin.  Seen by DM coordinator.  He has been educated on insulin injection.  Patient has been well educated at this time is very comfortable with insulin injection son is able to demonstrate how to draw injection as well.  Patient's sister endorses her daughter who is a nurse will be able to help as well. TOC consulted will be provided resources/meds/coupons and outpatient follow-up given new insulin requirement.  Medication sent to the medication management pharmacy [facility], TOC will provide resources for [facility] follow-[facility]  Discharge Diagnoses:  Type 2 diabetes mellitus with uncontrolled hyperglycemia: Blood sugar at this time fairly controlled although level has been fluctuating-we will discharge him on humalog 75/25, 30 u BID- may need ot uptitrate dose needs close follow up with pcp- family have been well educated regarding insulin injection, hyperglycemia and diabetes care.  Discussed with TOC  plan for d/c today with resources/meds. Follow ups in place Recent Labs  Lab 01/31/21 1144 01/31/21 1644 01/31/21 2049 02/01/21 0757 02/01/21 1129  GLUCAP 172* 143* 132* 309* 309*    Left foot with diabetic ulcer: X-ray shows no osteomyelitis, checked ABI- Resting ABI the bilateral lower extremity likely falsely elevated,potentially secondary to noncompressible vessels/medial calcinosis.Segmental Doppler at the bilateral ankles demonstrates waveforms relatively maintained.  Continue oral Keflex.  OP podiatry follow up  Elevated LFTs mild monitor  follow-up hepatitis serologies.upper quadrant ultrasound no acute finding.  Hypertriglyceridemia: Repeat fasting lipid profile with TG < 500.  Currently manage diabetes, to resume fibrates on D/C.  Abnormal thyroid function with TSH elevated at 1.5 normal free T4 recheck in 3  weeks  Severe malnutrition - Continue augment diet Nutrition Problem: Severe Malnutrition Etiology: chronic illness (uncontrolled DM, etoh abuse) Signs/Symptoms: severe fat depletion, severe muscle depletion     Consults: DM CO-0RDINATOR  Subjective: Alert awake resting comfortably eager to go home today. Discharge Exam: Vitals:   02/01/21 1004 02/01/21 1131  BP: (!) 143/85 131/82  Pulse: 91 82  Resp: 14 16  Temp: 98 F (36.7 C) 98.5 F (36.9 C)  SpO2: 98% 97%   General: Pt is alert, awake, not in acute distress Cardiovascular: RRR, S1/S2 +, no rubs, no gallops Respiratory: CTA bilaterally, no wheezing, no rhonchi Abdominal: Soft, NT, ND, bowel sounds + Extremities: no edema, no cyanosis  Discharge Instructions  Discharge Instructions     Discharge instructions   Complete by: As directed    Check blood sugar 3 times a day and bedtime at home. If blood sugar running above 200 less than 70 please call your MD to adjust insulin. If blood sugars running less 100 do not use insulin and call MD. If you noticed signs and symptoms of hypoglycemia or low blood sugar like jitteriness, confusion, thirst, tremor, sweating- Check blood sugar, drink sugary drink/biscuits/sweets to increase sugar level and call MD or return to ER.    Novolin 70/30 flexpen  - #[id number] Blood glucose meter - #[id number]  Insulin pen needles - #[id number]   Discharge wound care:   Complete by: As directed    Apply hydrogel to the right and left foot wounds. Top with dry dressing. Secure with kerlix and tape.   Increase activity slowly   Complete by: As directed       Allergies as of 02/01/2021   No Known Allergies      Medication List     STOP taking these medications    NovoLIN 70/30 (70-30) 100 UNIT/ML injection Generic drug: insulin NPH-regular Human       TAKE these medications    cephALEXin 500 MG capsule Commonly known as: KEFLEX Take 1 capsule (500 mg total) by mouth every 6  (six) hours for 7 days.   fenofibrate 145 MG tablet Commonly known as: TRICOR Take 1 tablet (145 mg total) by mouth once daily. What changed:  how much to take how to take this when to take this   Insulin Lispro Prot & Lispro (75-25) 100 UNIT/ML Kwikpen Commonly known as: HUMALOG 75/25 MIX Inject 30 Units into the skin 2 (two) times daily before lunch and supper.   metFORMIN 500 MG 24 hr tablet Commonly known as: GLUCOPHAGE-XR Take 1 tablet (500 mg total) by mouth 2 (two) times daily with a meal.   multivitamin with minerals Tabs tablet Take 1 tablet by mouth daily.   Pen Needles 3/16" 31G X 5 MM Misc 1 each by Does not apply route 2 (two) times daily for 100 doses.               Discharge Care Instructions  (From admission, onward)           Start     Ordered   02/01/21 0000  Discharge wound care:       Comments: Apply hydrogel to the right and left foot wounds. Top with dry dressing. Secure with kerlix and tape.  02/01/21 1059            Follow-up Information     Rosetta Posner, DPM. Go on 02/08/2021.   Specialty: Podiatry Why: 10:30am appointment $100 copay for visit Contact information: [address] [place] [address] [phone]                No Known Allergies  The results of significant diagnostics from this hospitalization (including imaging, microbiology, ancillary and laboratory) are listed below for reference.    Microbiology: Recent Results (from the past 240 hour(s))  Resp Panel by RT-PCR (Flu A&B, Covid) Nasopharyngeal Swab     Status: None   Collection Time: 01/27/21  2:10 PM   Specimen: Nasopharyngeal Swab; Nasopharyngeal(NP) swabs in vial transport medium  Result Value Ref Range Status   SARS Coronavirus 2 by RT PCR NEGATIVE NEGATIVE Final    Comment: (NOTE) SARS-CoV-2 target nucleic acids are NOT DETECTED.  The SARS-CoV-2 RNA is generally detectable in upper respiratory specimens during the acute phase of  infection. The lowest concentration of SARS-CoV-2 viral copies this assay can detect is 138 copies/mL. A negative result does not preclude SARS-Cov-2 infection and should not be used as the sole basis for treatment or other patient management decisions. A negative result may occur with  improper specimen collection/handling, submission of specimen other than nasopharyngeal swab, presence of viral mutation(s) within the areas targeted by this assay, and inadequate number of viral copies(<138 copies/mL). A negative result must be combined with clinical observations, patient history, and epidemiological information. The expected result is Negative.  Fact Sheet for Patients:  BloggerCourse.com  Fact Sheet for Healthcare Providers:  SeriousBroker.it  This test is no t yet approved or cleared by the [place] FDA and  has been authorized for detection and/or diagnosis of SARS-CoV-2 by FDA under an Emergency Use Authorization (EUA). This EUA will remain  in effect (meaning this test can be used) for the duration of the COVID-19 declaration under Section 564(b)(1) of the Act, 21 U.S.C.section 360bbb-3(b)(1), unless the authorization is terminated  or revoked sooner.       Influenza A by PCR NEGATIVE NEGATIVE Final   Influenza B by PCR NEGATIVE NEGATIVE Final    Comment: (NOTE) The Xpert Xpress SARS-CoV-2/FLU/RSV plus assay is intended as an aid in the diagnosis of influenza from Nasopharyngeal swab specimens and should not be used as a sole basis for treatment. Nasal washings and aspirates are unacceptable for Xpert Xpress SARS-CoV-2/FLU/RSV testing.  Fact Sheet for Patients: BloggerCourse.com  Fact Sheet for Healthcare Providers: SeriousBroker.it  This test is not yet approved or cleared by the [place] FDA and has been authorized for detection and/or diagnosis of SARS-CoV-2  by FDA under an Emergency Use Authorization (EUA). This EUA will remain in effect (meaning this test can be used) for the duration of the COVID-19 declaration under Section 564(b)(1) of the Act, 21 U.S.C. section 360bbb-3(b)(1), unless the authorization is terminated or revoked.  Performed at [facility], [address] [place], [place] [address]   MRSA Next Gen by PCR, Nasal     Status: None   Collection Time: 01/27/21  6:08 PM   Specimen: Nasal Mucosa; Nasal Swab  Result Value Ref Range Status   MRSA by PCR Next Gen NOT DETECTED NOT DETECTED Final    Comment: (NOTE) The GeneXpert MRSA Assay (FDA approved for NASAL specimens only), is one component of a comprehensive MRSA colonization surveillance program. It is not intended to diagnose MRSA infection  nor to guide or monitor treatment for MRSA infections. Test performance is not FDA approved in patients less than 72 years old. Performed at [facility], [address] [place], [place] [address]     Procedures/Studies: MR FOOT LEFT WO CONTRAST  Result Date: 01/27/2021 CLINICAL DATA:  Diabetic foot swelling, osteomyelitis suspected. Medial and lateral foot wounds for the last month. Intermittent drainage. EXAM: MRI OF THE LEFT FOOT WITHOUT CONTRAST TECHNIQUE: Multiplanar, multisequence MR imaging of the left hindfoot was performed. No intravenous contrast was administered. COMPARISON:  Foot radiographs same date FINDINGS: TENDONS Peroneal: Chronic tendinosis and partial tearing of the peroneal tendons which appear laterally subluxed from the retromalleolar groove. There is soft tissue thickening posterior and inferior to the distal fibula suspicious for synovitis. Posteromedial: Intact and normally positioned. There is a small amount of fluid within the posterior tibialis and flexor hallucis longus tendons. Anterior: Intact and normally positioned. Achilles: Intact. Plantar Fascia: Intact. LIGAMENTS Lateral: The  anterior and posterior talofibular and calcaneofibular ligaments are intact.The inferior tibiofibular ligaments appear intact. Medial: The deltoid and visualized portions of the spring ligament appear intact. CARTILAGE AND BONES Ankle Joint: Small ankle joint effusion with mild tibiotalar degenerative changes. The talar dome and tibial plafond appear normal. Subtalar Joints/Sinus Tarsi: Lateral hindfoot valgus and pes planus deformities with diffuse replacement of the tarsal sinus fat. The subtalar joint appears normal. No definite tarsal coalition. The sub fibular space is narrowed with inflammatory changes surrounding the laterally subluxed peroneal tendons. Bones: Mild degenerative changes at the calcaneocuboid joint. The alignment is normal at the Lisfranc joint. Mild spurring of both malleoli. No acute osseous findings or evidence of osteomyelitis. Other: Apparent soft tissue thickening over the lateral malleolus and peroneal tendons which may reflect synovitis or granulation tissue. No well-defined ulcer is identified in this region. There is no focal fluid collection. IMPRESSION: 1. Hindfoot valgus deformity with lateral subluxation of the peroneal tendons and surrounding synovitis or granulation tissue consistent with lateral hindfoot impingement syndrome. 2. No focal fluid collection or specific evidence of osteomyelitis. 3. No evidence of tarsal coalition as questioned previously. Degenerative changes as described. Electronically Signed   By: Carey Bullocks M.D.   On: 01/27/2021 16:22   US ARTERIAL ABI (SCREENING LOWER EXTREMITY)  Result Date: 01/28/2021 CLINICAL DATA:  55 year old male with diabetes and cardiovascular risk factors EXAM: NONINVASIVE PHYSIOLOGIC VASCULAR STUDY OF BILATERAL LOWER EXTREMITIES TECHNIQUE: Evaluation of both lower extremities was performed at rest, including calculation of ankle-brachial indices, multiple segmental pressure evaluation, segmental Doppler and segmental pulse  volume recording. COMPARISON:  None. FINDINGS: Right ABI:  1.34 Left ABI:  1.51 Right Lower Extremity: Segmental Doppler at the right ankle demonstrates biphasic waveforms Left Lower Extremity: Segmental Doppler at the left ankle demonstrates biphasic waveforms IMPRESSION: Resting ABI the bilateral lower extremity likely falsely elevated, potentially secondary to noncompressible vessels/medial calcinosis. Segmental Doppler at the bilateral ankles demonstrates waveforms relatively maintained Signed, Yvone Neu. Reyne Dumas, RPVI [facility] Electronically Signed   By: Gilmer Mor D.O.   On: 01/28/2021 11:21   DG Finger Little Left  Result Date: 01/27/2021 CLINICAL DATA:  Little finger pain since falling off of a ladder 20 days ago. History of diabetes. Pain/wound. EXAM: LEFT LITTLE FINGER 2+V COMPARISON:  None. FINDINGS: The bones appear demineralized. No evidence of acute fracture, dislocation or bone destruction. The joint spaces are preserved. No soft tissue wound, foreign body or soft tissue emphysema identified. IMPRESSION: No acute osseous findings  or radiographic evidence of osteomyelitis. Electronically Signed   By: Carey Bullocks M.D.   On: 01/27/2021 11:13   DG Foot Complete Left  Result Date: 01/27/2021 CLINICAL DATA:  Left lateral foot pain since falling off of a ladder 20 days ago. Pain/wound. EXAM: LEFT FOOT - COMPLETE 3+ VIEW COMPARISON:  None. FINDINGS: The bones appear diffusely demineralized. There is no evidence of acute fracture dislocation or bone destruction. There is a pes planus deformity with poor definition of the subtalar joint and dorsal talar beaking, suspicious for underlying tarsal coalition. Mild additional degenerative changes are present in the midfoot and 1st MTP joint. No soft tissue wound identified. There are prominent vascular calcifications suggesting underlying diabetes. IMPRESSION: 1. No acute osseous  findings or radiographic evidence of osteomyelitis. 2. 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 Electronically Signed   By: Carey Bullocks M.D.   On: 01/27/2021 11:12   US Abdomen Limited RUQ (LIVER/GB)  Result Date: 01/28/2021 CLINICAL DATA:  Transaminitis.  Denies abdominal pain. EXAM: ULTRASOUND ABDOMEN LIMITED RIGHT UPPER QUADRANT COMPARISON:  Right upper quadrant ultrasound dated January 13, 2020. FINDINGS: Gallbladder: No gallstones or wall thickening visualized. No sonographic Murphy sign noted by sonographer. Common bile duct: Diameter: 3 mm, normal. Liver: No focal lesion identified. Unchanged diffusely increased parenchymal echogenicity. Portal vein is patent on color Doppler imaging with normal direction of blood flow towards the liver. Other: None. IMPRESSION: 1. No acute abnormality. 2. Unchanged hepatic steatosis. Electronically Signed   By: Obie Dredge M.D.   On: 01/28/2021 07:33    Labs: BNP (last 3 results) No results for input(s): BNP in the last 8760 hours. Basic Metabolic Panel: Recent Labs  Lab 01/27/21 1126 01/28/21 0626 01/30/21 0406  NA 126* 130* 131*  K 4.8 3.6 4.2  CL 92* 97* 98  CO2 GLUCOSE 527* 217* 204*  BUN CREATININE 0.68 0.53* 0.58*  CALCIUM 9.2 8.8* 9.5   Liver Function Tests: Recent Labs  Lab 01/27/21 1126 01/28/21 0626  AST 76* 54*  ALT 56* 42  ALKPHOS 416* 274*  BILITOT 1.3* 1.2  PROT 7.4 6.5  ALBUMIN 3.3* 2.8*   No results for input(s): LIPASE, AMYLASE in the last 168 hours. No results for input(s): AMMONIA in the last 168 hours. CBC: Recent Labs  Lab 01/27/21 1126 01/28/21 0626 01/30/21 0406  WBC 3.6* 4.2 4.2  NEUTROABS 2.0  --   --   HGB 11.8* 11.1* 10.9*  HCT 34.4* 31.5* 31.2*  MCV 99.7 98.1 99.0  PLT 106* 105* 132*   Cardiac Enzymes: No results for input(s): CKTOTAL, CKMB, CKMBINDEX, TROPONINI in the last 168 hours. BNP: Invalid input(s): POCBNP CBG: Recent Labs  Lab  01/31/21 1144 01/31/21 1644 01/31/21 2049 02/01/21 0757 02/01/21 1129  GLUCAP 172* 143* 132* 309* 309*   D-Dimer No results for input(s): DDIMER in the last 72 hours. Hgb A1c No results for input(s): HGBA1C in the last 72 hours. Lipid Profile No results for input(s): CHOL, HDL, LDLCALC, TRIG, CHOLHDL, LDLDIRECT in the last 72 hours. Thyroid function studies No results for input(s): TSH, T4TOTAL, T3FREE, THYROIDAB in the last 72 hours.  Invalid input(s): FREET3 Anemia work up No results for input(s): VITAMINB12, FOLATE, FERRITIN, TIBC, IRON, RETICCTPCT in the last 72 hours. Urinalysis    Component Value Date/Time   COLORURINE YELLOW (A) 01/14/2020 1947   APPEARANCEUR CLEAR (A) 01/14/2020 1947   LABSPEC 1.003 (L) 01/14/2020 1947   PHURINE 6.0 01/14/2020 1947  GLUCOSEU >=500 (A) 01/14/2020 1947   HGBUR NEGATIVE 01/14/2020 1947   BILIRUBINUR NEGATIVE 01/14/2020 1947   KETONESUR NEGATIVE 01/14/2020 1947   PROTEINUR NEGATIVE 01/14/2020 1947   NITRITE NEGATIVE 01/14/2020 1947   LEUKOCYTESUR NEGATIVE 01/14/2020 1947   Sepsis Labs Invalid input(s): PROCALCITONIN,  WBC,  LACTICIDVEN Microbiology Recent Results (from the past 240 hour(s))  Resp Panel by RT-PCR (Flu A&B, Covid) Nasopharyngeal Swab     Status: None   Collection Time: 01/27/21  2:10 PM   Specimen: Nasopharyngeal Swab; Nasopharyngeal(NP) swabs in vial transport medium  Result Value Ref Range Status   SARS Coronavirus 2 by RT PCR NEGATIVE NEGATIVE Final    Comment: (NOTE) SARS-CoV-2 target nucleic acids are NOT DETECTED.  The SARS-CoV-2 RNA is generally detectable in upper respiratory specimens during the acute phase of infection. The lowest concentration of SARS-CoV-2 viral copies this assay can detect is 138 copies/mL. A negative result does not preclude SARS-Cov-2 infection and should not be used as the sole basis for treatment or other patient management decisions. A negative result may occur with  improper  specimen collection/handling, submission of specimen other than nasopharyngeal swab, presence of viral mutation(s) within the areas targeted by this assay, and inadequate number of viral copies(<138 copies/mL). A negative result must be combined with clinical observations, patient history, and epidemiological information. The expected result is Negative.  Fact Sheet for Patients:  BloggerCourse.com  Fact Sheet for Healthcare Providers:  SeriousBroker.it  This test is no t yet approved or cleared by the [place] FDA and  has been authorized for detection and/or diagnosis of SARS-CoV-2 by FDA under an Emergency Use Authorization (EUA). This EUA will remain  in effect (meaning this test can be used) for the duration of the COVID-19 declaration under Section 564(b)(1) of the Act, 21 U.S.C.section 360bbb-3(b)(1), unless the authorization is terminated  or revoked sooner.       Influenza A by PCR NEGATIVE NEGATIVE Final   Influenza B by PCR NEGATIVE NEGATIVE Final    Comment: (NOTE) The Xpert Xpress SARS-CoV-2/FLU/RSV plus assay is intended as an aid in the diagnosis of influenza from Nasopharyngeal swab specimens and should not be used as a sole basis for treatment. Nasal washings and aspirates are unacceptable for Xpert Xpress SARS-CoV-2/FLU/RSV testing.  Fact Sheet for Patients: BloggerCourse.com  Fact Sheet for Healthcare Providers: SeriousBroker.it  This test is not yet approved or cleared by the [place] FDA and has been authorized for detection and/or diagnosis of SARS-CoV-2 by FDA under an Emergency Use Authorization (EUA). This EUA will remain in effect (meaning this test can be used) for the duration of the COVID-19 declaration under Section 564(b)(1) of the Act, 21 U.S.C. section 360bbb-3(b)(1), unless the authorization is terminated or revoked.  Performed at  [facility], [address] [place], [place] [address]   MRSA Next Gen by PCR, Nasal     Status: None   Collection Time: 01/27/21  6:08 PM   Specimen: Nasal Mucosa; Nasal Swab  Result Value Ref Range Status   MRSA by PCR Next Gen NOT DETECTED NOT DETECTED Final    Comment: (NOTE) The GeneXpert MRSA Assay (FDA approved for NASAL specimens only), is one component of a comprehensive MRSA colonization surveillance program. It is not intended to diagnose MRSA infection nor to guide or monitor treatment for MRSA infections. Test performance is not FDA approved in patients less than 38 years old. Performed at [facility], [address], [place], [place] [address]  Time coordinating discharge: 35 minutes  SIGNED: Lanae Boast, MD  [facility] 02/01/2021, 1:21 PM  If 7PM-7AM, please contact night-coverage [email or web address]

## 2021-02-28 ENCOUNTER — Emergency Department: Payer: Self-pay

## 2021-02-28 ENCOUNTER — Inpatient Hospital Stay
Admission: EM | Admit: 2021-02-28 | Discharge: 2021-03-02 | DRG: 440 | Disposition: A | Discharge status: Home / Self Care | Payer: Self-pay | Attending: Hospitalist | Admitting: Hospitalist

## 2021-02-28 ENCOUNTER — Other Ambulatory Visit: Payer: Self-pay

## 2021-02-28 ENCOUNTER — Encounter: Payer: Self-pay | Admitting: Emergency Medicine

## 2021-02-28 DIAGNOSIS — K859 Acute pancreatitis without necrosis or infection, unspecified: Principal | ICD-10-CM | POA: Diagnosis present

## 2021-02-28 DIAGNOSIS — Z20822 Contact with and (suspected) exposure to covid-19: Secondary | ICD-10-CM | POA: Diagnosis present

## 2021-02-28 DIAGNOSIS — E781 Pure hyperglyceridemia: Secondary | ICD-10-CM | POA: Diagnosis present

## 2021-02-28 DIAGNOSIS — K852 Alcohol induced acute pancreatitis without necrosis or infection: Secondary | ICD-10-CM

## 2021-02-28 DIAGNOSIS — F1010 Alcohol abuse, uncomplicated: Secondary | ICD-10-CM | POA: Diagnosis present

## 2021-02-28 DIAGNOSIS — Z79899 Other long term (current) drug therapy: Secondary | ICD-10-CM

## 2021-02-28 DIAGNOSIS — Z7984 Long term (current) use of oral hypoglycemic drugs: Secondary | ICD-10-CM

## 2021-02-28 DIAGNOSIS — Z794 Long term (current) use of insulin: Secondary | ICD-10-CM

## 2021-02-28 DIAGNOSIS — E11649 Type 2 diabetes mellitus with hypoglycemia without coma: Secondary | ICD-10-CM | POA: Diagnosis present

## 2021-02-28 HISTORY — DX: Type 2 diabetes mellitus without complications: E11.9

## 2021-02-28 LAB — URINALYSIS, MICROSCOPIC (REFLEX): Bacteria, UA: NONE SEEN

## 2021-02-28 LAB — URINALYSIS, ROUTINE W REFLEX MICROSCOPIC
Bilirubin Urine: NEGATIVE
Glucose, UA: >1000 mg/dL — AB
Hgb urine dipstick: NEGATIVE
Ketones, ur: 40 mg/dL — AB
Leukocytes,Ua: NEGATIVE
Nitrite: NEGATIVE
Protein, ur: NEGATIVE mg/dL
Specific Gravity, Urine: 1.01 (ref 1.005–1.030)
pH: 5.5 (ref 5.0–8.0)

## 2021-02-28 LAB — COMPREHENSIVE METABOLIC PANEL
ALT: 69 U/L — ABNORMAL HIGH (ref 0–44)
AST: 61 U/L — ABNORMAL HIGH (ref 15–41)
Albumin: 3.9 g/dL (ref 3.5–5.0)
Alkaline Phosphatase: 217 U/L — ABNORMAL HIGH (ref 38–126)
Anion gap: 11 (ref 5–15)
BUN: 14 mg/dL (ref 6–20)
CO2: 22 mmol/L (ref 22–32)
Calcium: 9.3 mg/dL (ref 8.9–10.3)
Chloride: 93 mmol/L — ABNORMAL LOW (ref 98–111)
Creatinine, Ser: 0.8 mg/dL (ref 0.61–1.24)
GFR, Estimated: >60 mL/min (ref >60)
Glucose, Bld: 451 mg/dL — ABNORMAL HIGH (ref 70–99)
Potassium: 3.6 mmol/L (ref 3.5–5.1)
Sodium: 126 mmol/L — ABNORMAL LOW (ref 135–145)
Total Bilirubin: 2.5 mg/dL — ABNORMAL HIGH (ref 0.3–1.2)
Total Protein: 7.6 g/dL (ref 6.5–8.1)

## 2021-02-28 LAB — CBG MONITORING, ED: Glucose-Capillary: 461 mg/dL — ABNORMAL HIGH (ref 70–99)

## 2021-02-28 LAB — TRIGLYCERIDES: Triglycerides: 871 mg/dL — ABNORMAL HIGH (ref <150)

## 2021-02-28 LAB — CBC
HCT: 39.7 % (ref 39.0–52.0)
Hemoglobin: 13.9 g/dL (ref 13.0–17.0)
MCH: 34.1 pg — ABNORMAL HIGH (ref 26.0–34.0)
MCHC: 35 g/dL (ref 30.0–36.0)
MCV: 97.3 fL (ref 80.0–100.0)
Platelets: 135 10*3/uL — ABNORMAL LOW (ref 150–400)
RBC: 4.08 MIL/uL — ABNORMAL LOW (ref 4.22–5.81)
RDW: 12 % (ref 11.5–15.5)
WBC: 7 10*3/uL (ref 4.0–10.5)
nRBC: 0 % (ref 0.0–0.2)

## 2021-02-28 LAB — RESP PANEL BY RT-PCR (FLU A&B, COVID) ARPGX2
Influenza A by PCR: NEGATIVE
Influenza B by PCR: NEGATIVE
SARS Coronavirus 2 by RT PCR: NEGATIVE

## 2021-02-28 LAB — LIPASE, BLOOD: Lipase: 571 U/L — ABNORMAL HIGH (ref 11–51)

## 2021-02-28 LAB — GLUCOSE, CAPILLARY: Glucose-Capillary: 413 mg/dL — ABNORMAL HIGH (ref 70–99)

## 2021-02-28 IMAGING — CT CT ABD-PELV W/ CM
2 of 5 series · 16 of 46 positions shown, 18 images · IV contrast (omnipaque)
Comparison: [DATE]

CLINICAL DATA: Abdominal pain

EXAM:
CT ABDOMEN AND PELVIS WITH CONTRAST
TECHNIQUE: Multidetector CT imaging of the abdomen and pelvis was performed
using the standard protocol following bolus administration of
intravenous contrast.
CONTRAST:  80mL OMNIPAQUE IOHEXOL 300 MG/ML  SOLN

[Series 2: routine abd/pel with · axial · 0.72mm/px · z∈[-986,-561]mm · 13 of 97 slices shown, 15 images]
[im 6/97  soft-tissue]
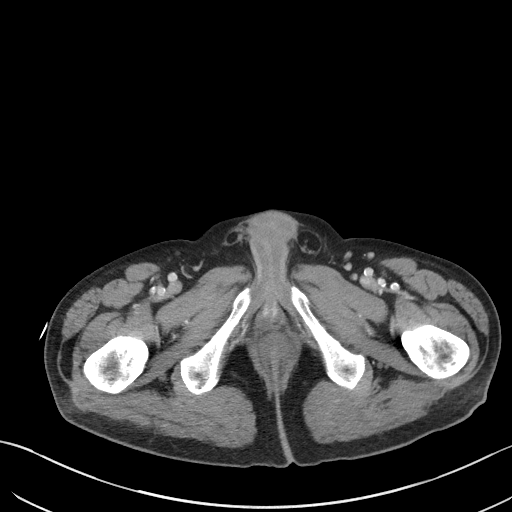
[im 6/97  bone]
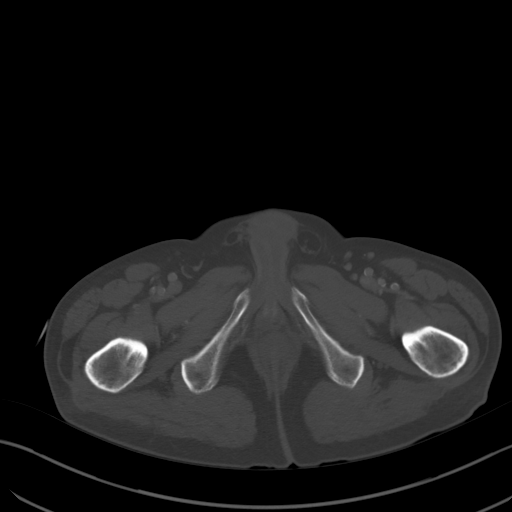
[im 11/97  soft-tissue]
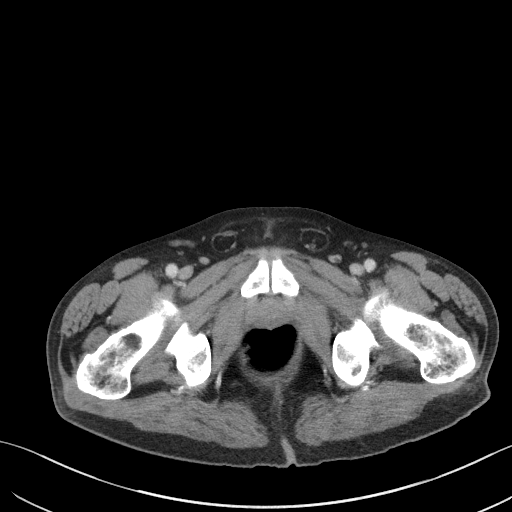
[im 22/97  soft-tissue]
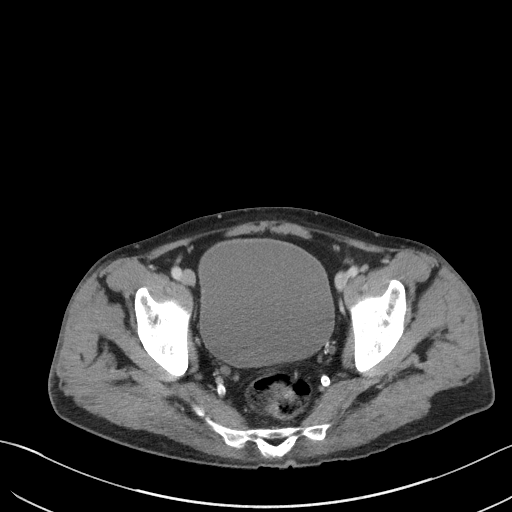
[im 27/97  soft-tissue]
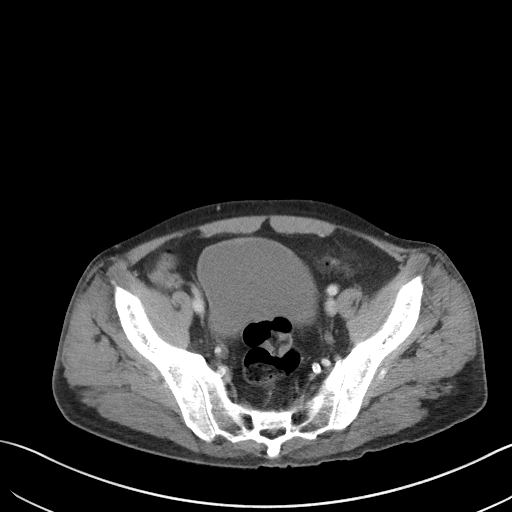
[im 33/97  soft-tissue]
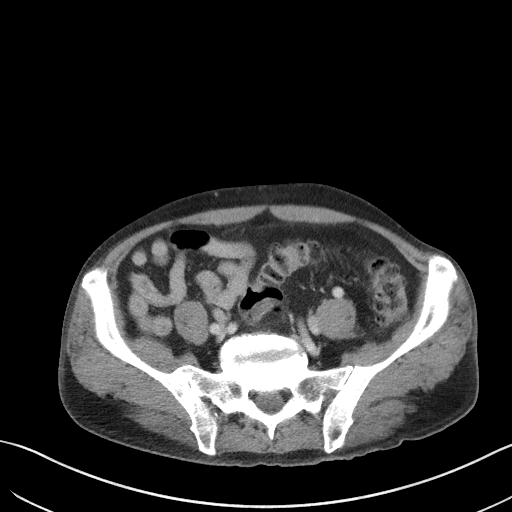
[im 43/97  soft-tissue]
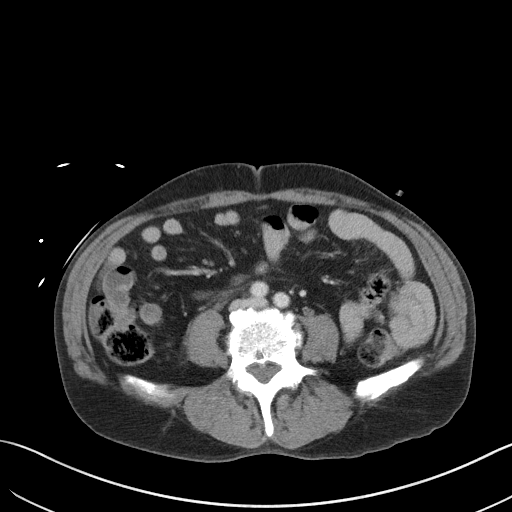
[im 49/97  soft-tissue]
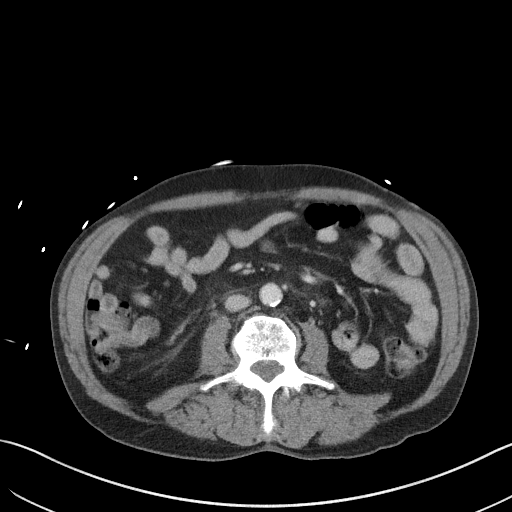
[im 54/97  soft-tissue]
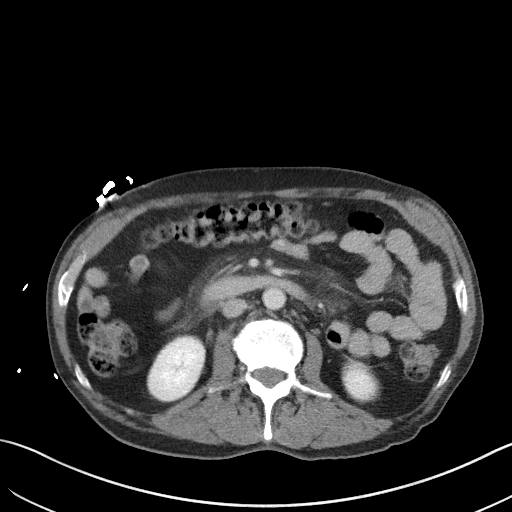
[im 65/97  soft-tissue]
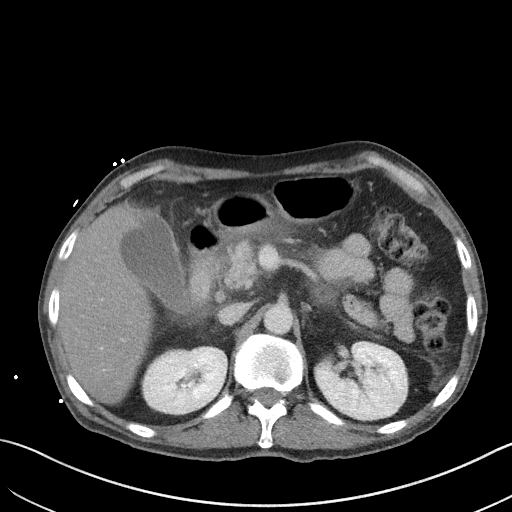
[im 65/97  bone]
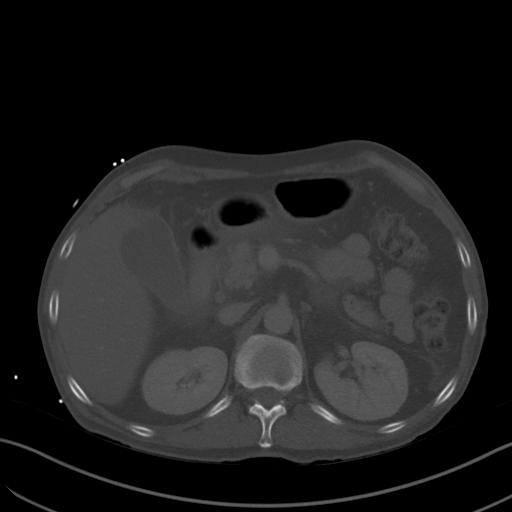
[im 70/97  soft-tissue]
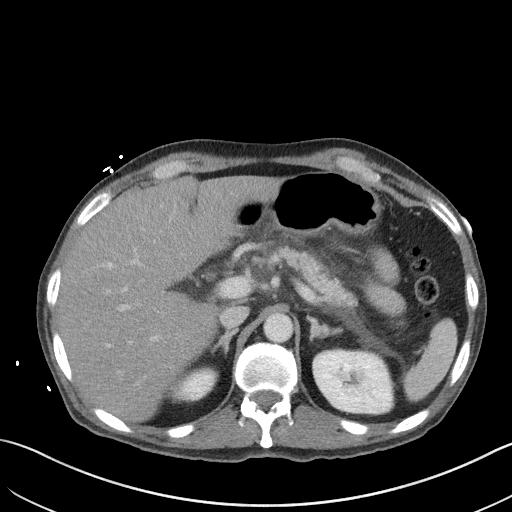
[im 75/97  soft-tissue]
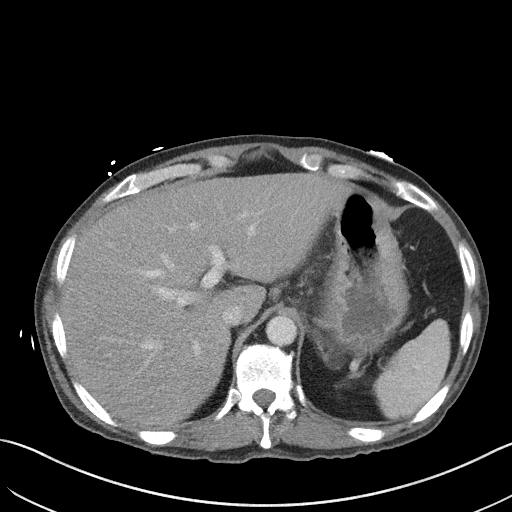
[im 86/97  soft-tissue]
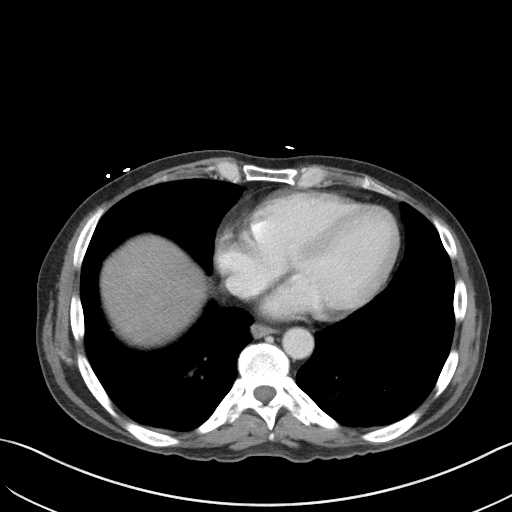
[im 91/97  soft-tissue]
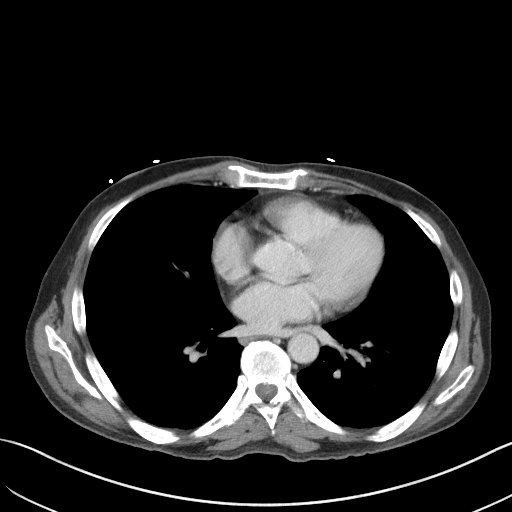

[Series 6: coronal st · coronal · 0.69mm/px · 3 of 81 slices shown]
[im 27/81  soft-tissue]
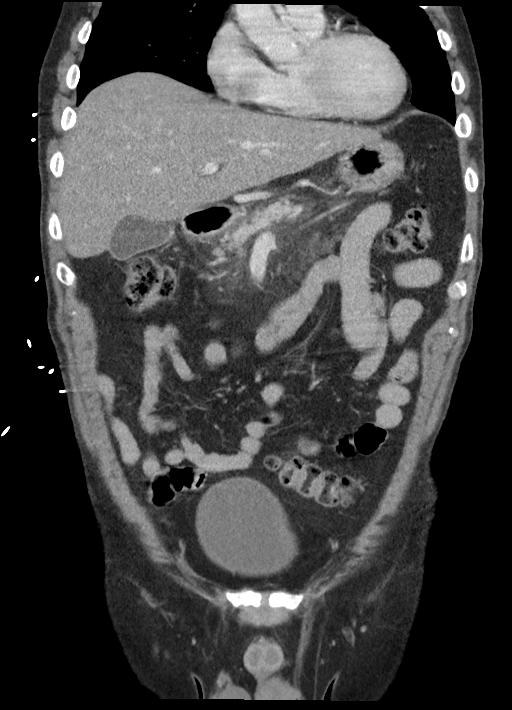
[im 36/81  soft-tissue]
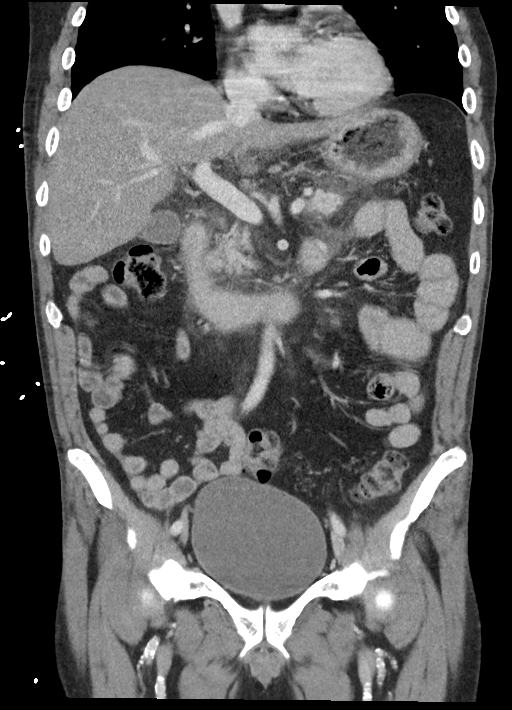
[im 45/81  soft-tissue]
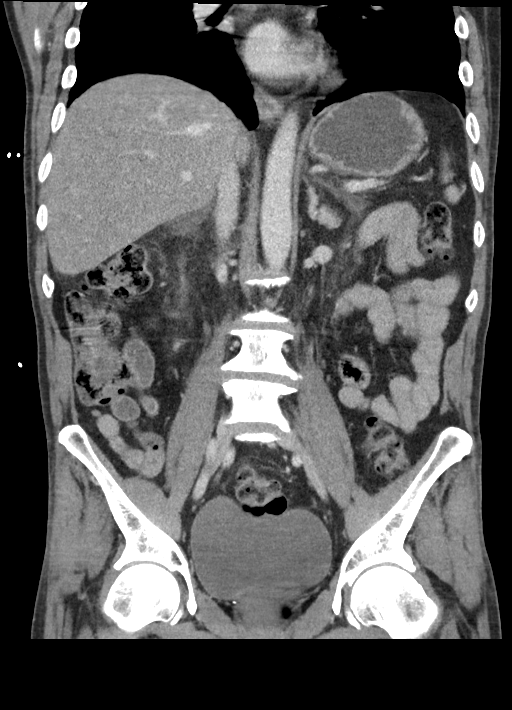

[16 of 46 positions shown; findings below may reference images not displayed]

FINDINGS: Lower chest: No acute abnormality.

Hepatobiliary: No solid liver abnormality is seen. Hepatic
steatosis. No gallstones, gallbladder wall thickening, or biliary
dilatation.

Pancreas: Diffuse peripancreatic fat stranding and fluid about the
pancreas and adjacent retroperitoneum. No discrete fluid collection.
No parenchymal hypoenhancement. No pancreatic ductal dilatation.

Spleen: Normal in size without significant abnormality.

Adrenals/Urinary Tract: Adrenal glands are unremarkable. Kidneys are
normal, without renal calculi, solid lesion, or hydronephrosis.
Bladder is unremarkable.

Stomach/Bowel: Stomach is within normal limits. Appendix appears
normal. No evidence of bowel wall thickening, distention, or
inflammatory changes.

Vascular/Lymphatic: Aortic atherosclerosis. Enlarged portacaval
lymph node measuring up to 1.9 x 1.2 cm (series 2, image 31).

Reproductive: No mass or other significant abnormality.

Other: No abdominal wall hernia or abnormality. No abdominopelvic
ascites.

Musculoskeletal: No acute or significant osseous findings.
IMPRESSION: 1. Diffuse peripancreatic fat stranding and fluid about the pancreas
and adjacent retroperitoneum, consistent with acute pancreatitis. No
discrete fluid collection or evidence of pancreatic necrosis.
2. Enlarged portacaval lymph node, likely reactive.
3. Hepatic steatosis.

Aortic Atherosclerosis ([U7]-[U7]).

## 2021-02-28 IMAGING — MR MR MRCP
10 of 12 series · 40 of 48 positions shown · non-contrast
Comparison: CT scan and ultrasound exam earlier same day.

CLINICAL DATA: Pancreatitis on CT scan earlier today.

EXAM:
MRI ABDOMEN WITHOUT CONTRAST  (INCLUDING MRCP)
TECHNIQUE: Multiplanar multisequence MR imaging of the abdomen was performed.
Heavily T2-weighted images of the biliary and pancreatic ducts were
obtained, and three-dimensional MRCP images were rendered by post
processing.

[Series 4: T2 · axial · 6.0mm · 1.19mm/px · z∈[-17,+191]mm · 2 of 30 slices shown (1 of 2)]
[im 1/30]
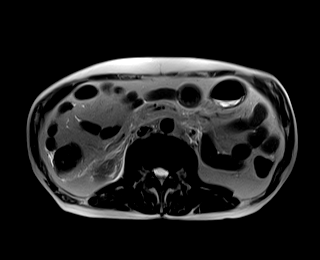
[im 30/30]
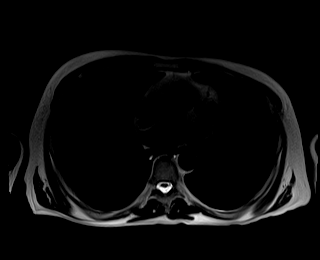

[Series 5: T2 · coronal · 6.0mm · 1.25mm/px · 2 of 32 slices shown (2 of 2)]
[im 1/32]
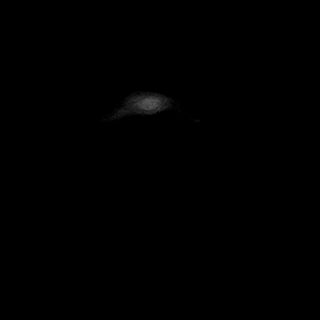
[im 32/32]
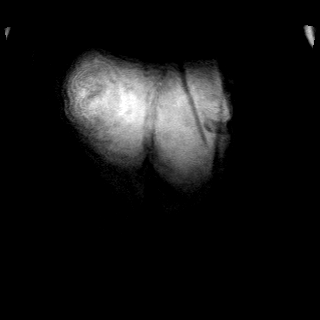

[Series 6: T1 · axial · 3.0mm · 1.19mm/px · z∈[-22,+191]mm · 6 of 72 slices shown (1 of 2)]
[im 1/72]
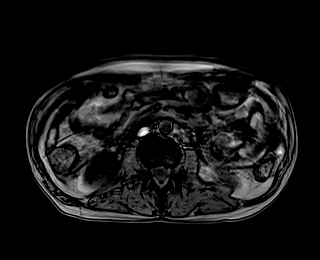
[im 15/72]
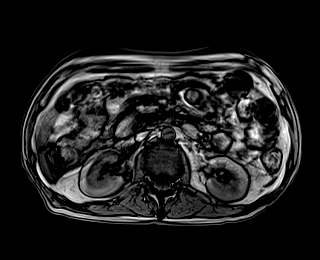
[im 29/72]
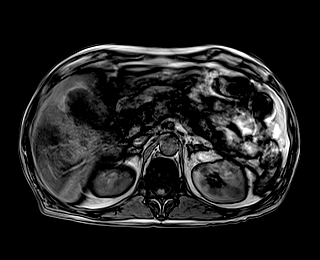
[im 43/72]
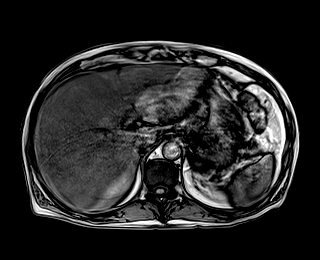
[im 57/72]
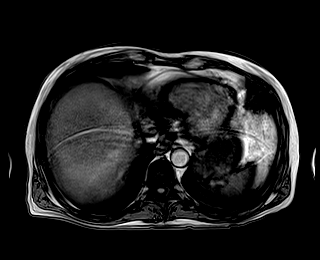
[im 72/72]
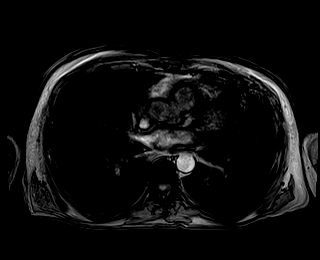

[Series 7: T1 · axial · 3.0mm · 1.19mm/px · z∈[-22,+191]mm · 6 of 72 slices shown (2 of 2)]
[im 1/72]
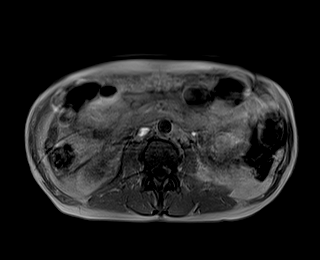
[im 15/72]
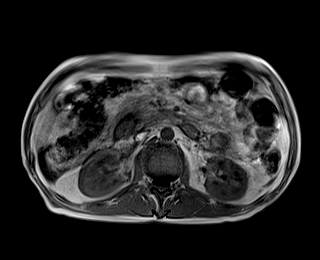
[im 29/72]
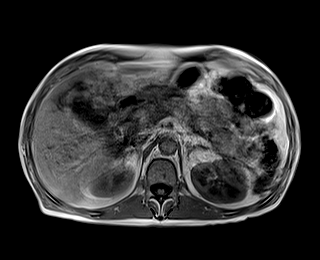
[im 43/72]
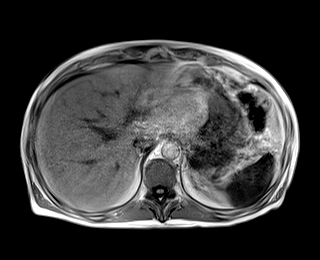
[im 57/72]
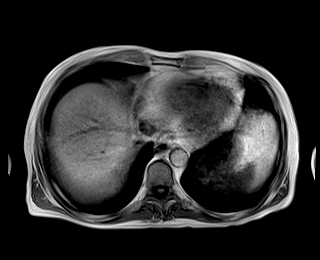
[im 72/72]
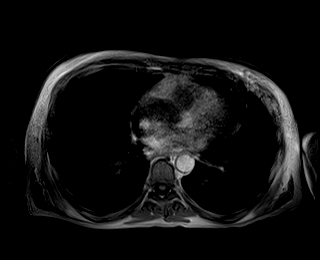

[Series 10: T2 fat-sat · axial · 6.0mm · 1.19mm/px · z∈[-18,+190]mm · 3 of 30 slices shown]
[im 1/30]
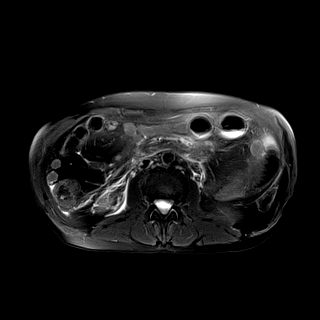
[im 15/30]
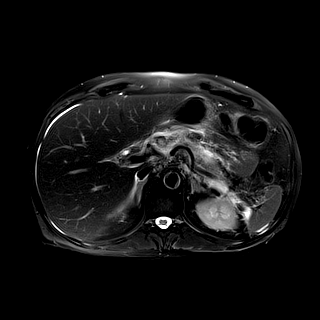
[im 30/30]
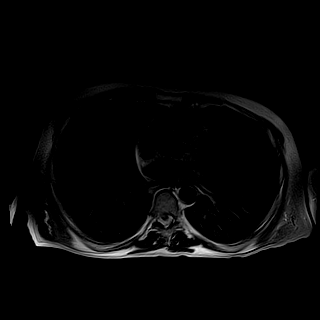

[Series 11: ax dwi_tracew · axial · 6.0mm · 1.42mm/px · z∈[-18,+190]mm · 8 of 90 slices shown]
[im 1/90]
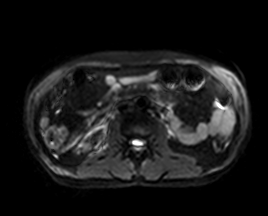
[im 13/90]
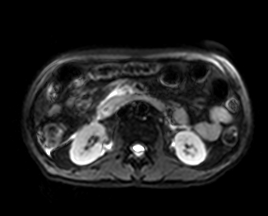
[im 26/90]
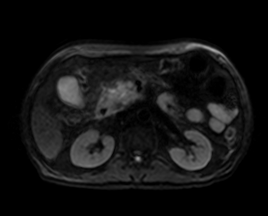
[im 39/90]
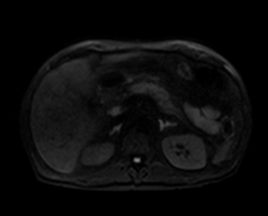
[im 51/90]
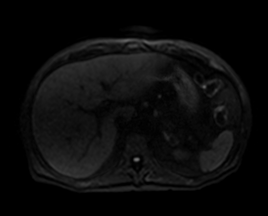
[im 64/90]
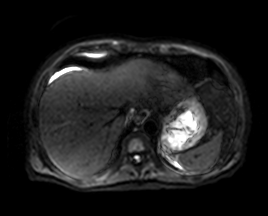
[im 77/90]
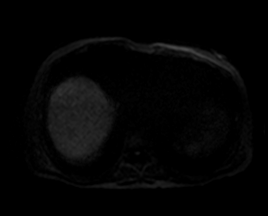
[im 90/90]
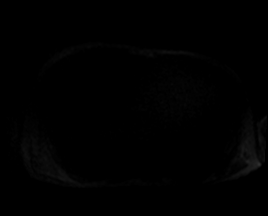

[Series 12: ax dwi_adc · axial · 6.0mm · 1.42mm/px · z∈[-18,+190]mm · 3 of 30 slices shown]
[im 1/30]
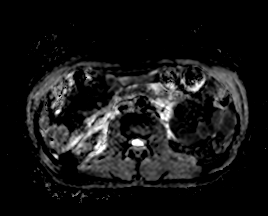
[im 15/30]
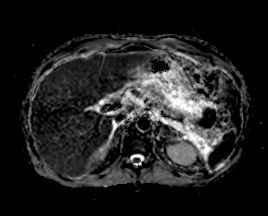
[im 30/30]
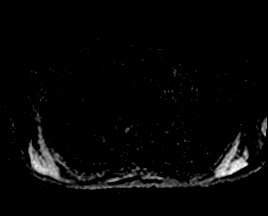

[Series 16: MRCP · coronal · 3.0mm · 1.12mm/px · 2 of 18 slices shown]
[im 1/18]
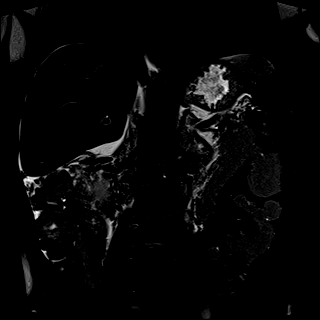
[im 18/18]
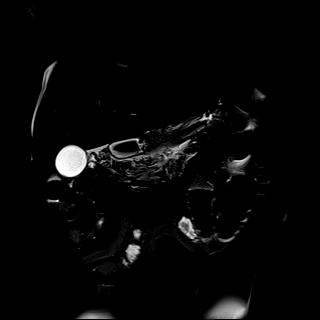

[Series 17: radials · coronal · 50.0mm · 0.78mm/px · 1 of 5 slices shown]
[im 1/5]
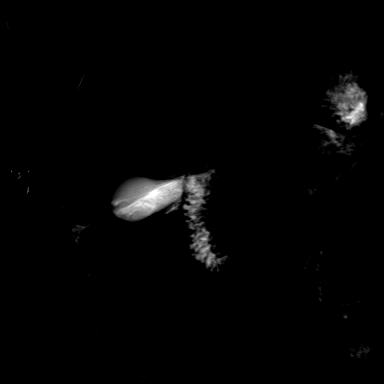

[Series 18: T1 dynamic fat-sat · axial · non-contrast · 3.0mm · 1.19mm/px · z∈[-24,+189]mm · 7 of 72 slices shown]
[im 1/72]
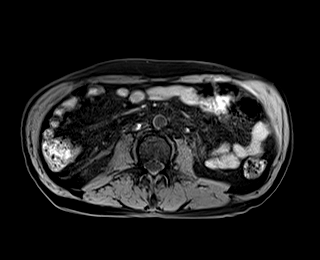
[im 12/72]
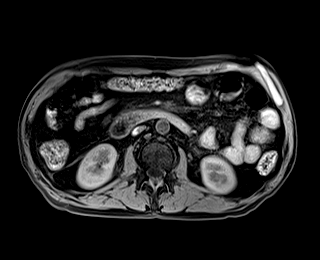
[im 24/72]
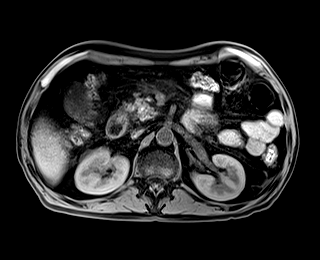
[im 36/72]
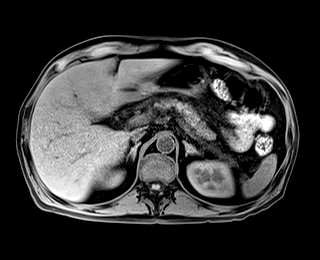
[im 48/72]
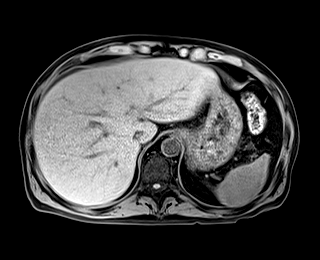
[im 60/72]
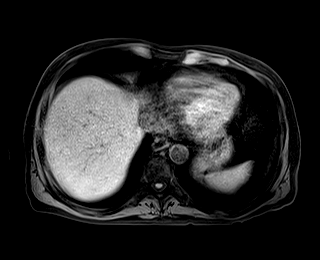
[im 72/72]
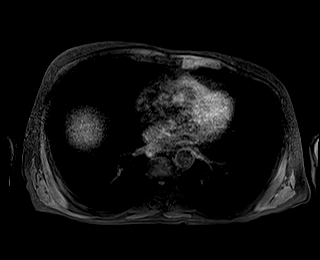

[40 of 48 positions shown; findings below may reference images not displayed]

FINDINGS: Lower chest: Unremarkable.

Hepatobiliary: No focal abnormality in the liver on this study
without intravenous contrast. Gallbladder is distended with
potential but not definite 2 mm stone towards the fundus (coronal T2
haste image 11 of series 5). No intrahepatic or extrahepatic biliary
duct dilatation. Extrahepatic common duct measures 4 mm diameter.
Common bile duct in the head of the pancreas appears attenuated (see
coronal 3D MRCP image 34 of series 14 and coronal thin MRCP image 10
of series 16), possibly from edema in the head of the pancreas.

Pancreas: As seen on CT, there is diffuse edema in the pancreas and
in the peripancreatic fat. No main duct dilatation.

Spleen:  No splenomegaly. No focal mass lesion.

Adrenals/Urinary Tract: No right adrenal nodule or mass. 13 mm left
adrenal nodule shows loss of signal intensity on out of phase T1
imaging compatible with adenoma. Kidneys unremarkable.

Stomach/Bowel: Stomach is nondistended. Peri-duodenal edema evident.
No small bowel or colonic dilatation within the visualized abdomen.

Vascular/Lymphatic: No abdominal aortic aneurysm. No abdominal
lymphadenopathy.

Other: Small volume free fluid noted around the liver with extensive
edema in the retroperitoneal space of the abdomen and tracking into
the hepatoduodenal ligament and region of gallbladder fossa.

Musculoskeletal: No suspicious marrow signal abnormality.
IMPRESSION: 1. Possible but not definite tiny 2 mm gallstone. No evidence for
choledocholithiasis. No intra or extrahepatic biliary duct
dilatation.
2. Edema in and around the pancreas consistent with acute
pancreatitis. There is some edema in the wall of the descending
duodenum and in the Peri duodenal fat. Duodenitis/peptic ulcer
disease a possibility but considered less likely by imaging.
3. Attenuation of the common bile duct in the head of the pancreas,
potentially secondary the pancreatic edema. No intrahepatic or
extrahepatic biliary duct dilatation. No evidence for
choledocholithiasis.
4. 13 mm left adrenal adenoma.

## 2021-02-28 IMAGING — US US ABDOMEN LIMITED
1 series · 14 of 25 positions shown · non-contrast
Comparison: [DATE]; same day CT

CLINICAL DATA: elevated lfts, upper abd pain

EXAM:
ULTRASOUND ABDOMEN LIMITED RIGHT UPPER QUADRANT

[Series 1: us abdomen limited ruq (liver/gb) · 14 of 48 slices shown]
[im 1/48]
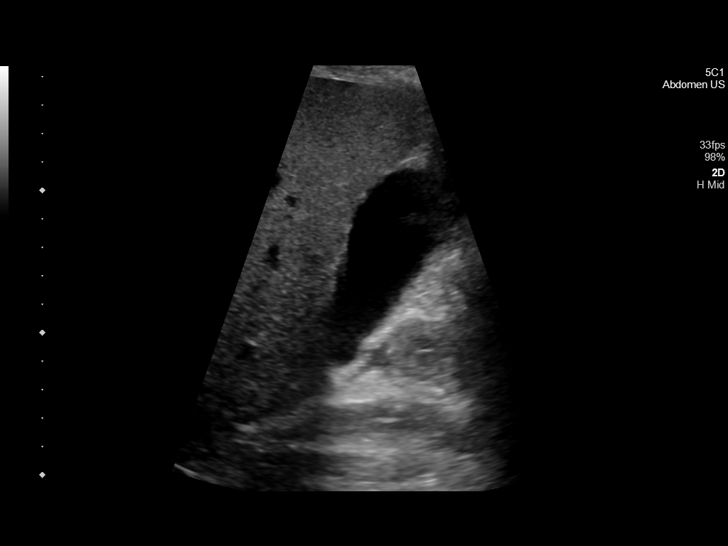
[im 4/48]
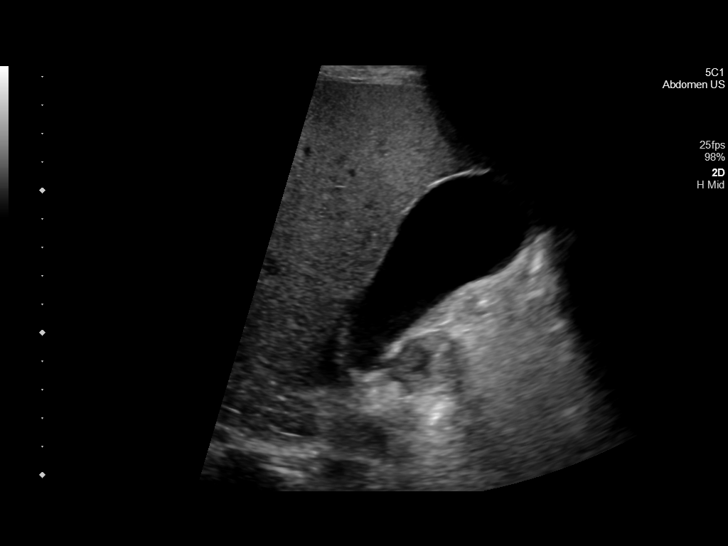
[im 8/48]
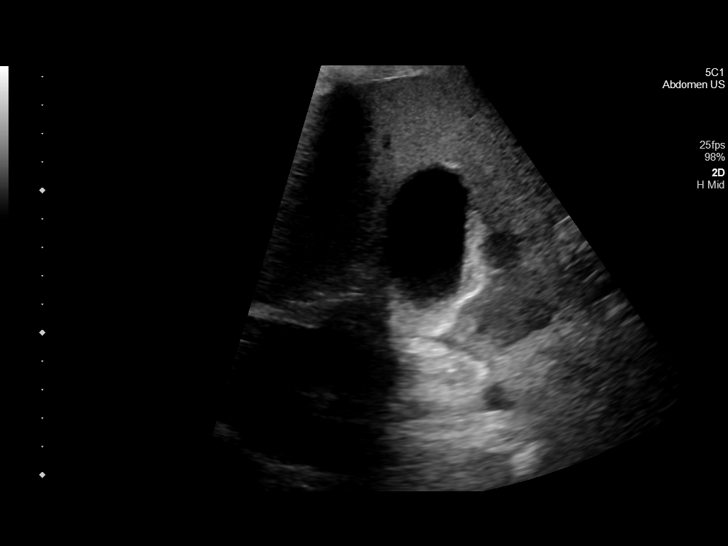
[im 12/48]
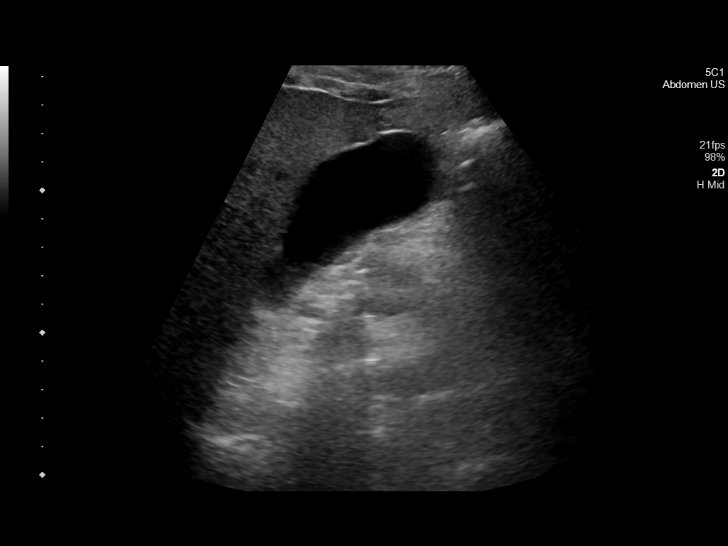
[im 16/48]
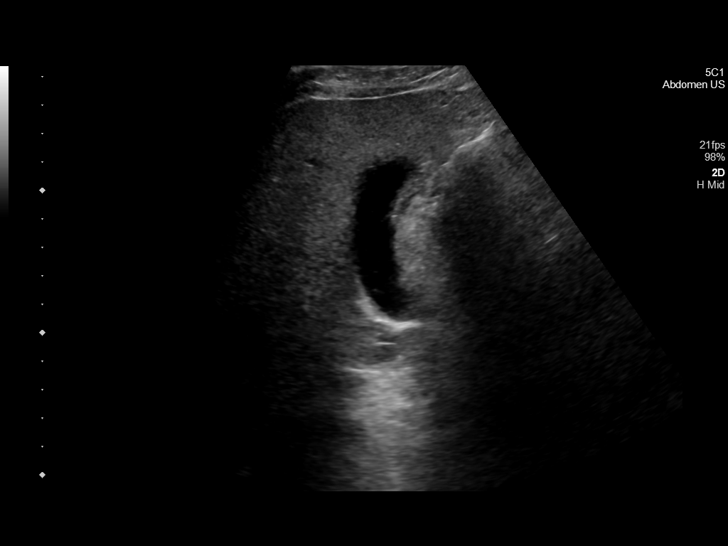
[im 18/48]
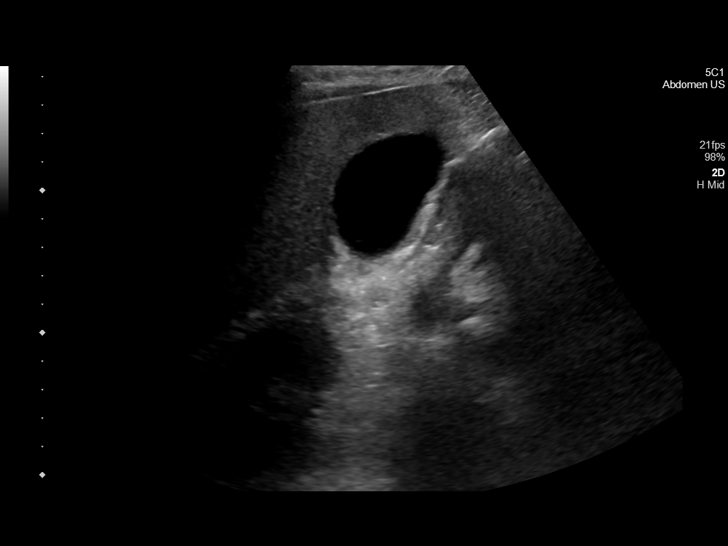
[im 22/48]
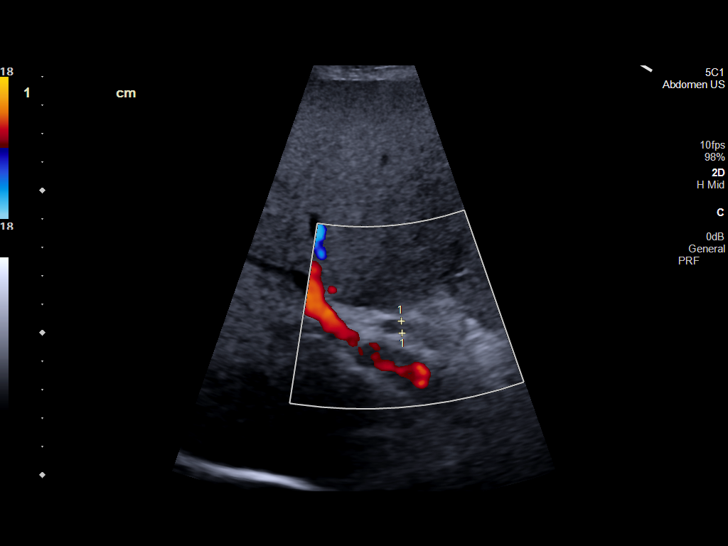
[im 26/48]
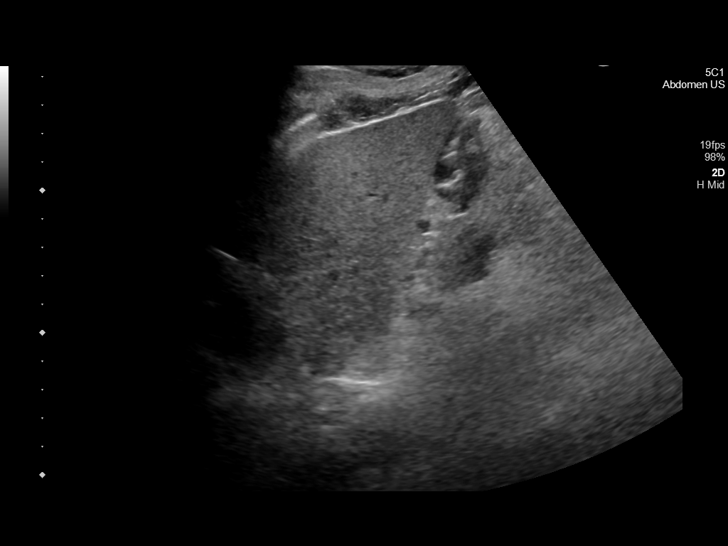
[im 30/48]
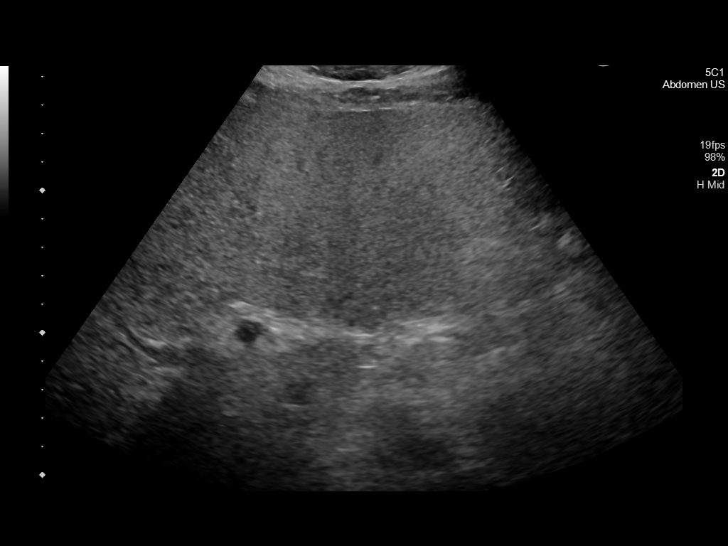
[im 32/48]
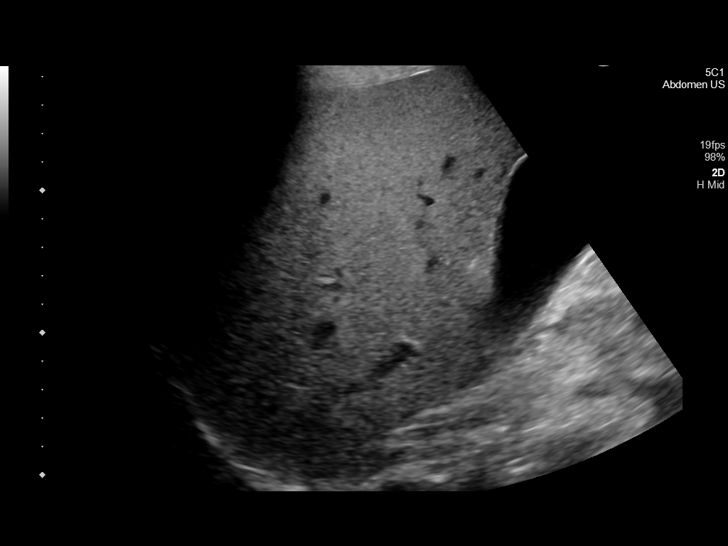
[im 36/48]
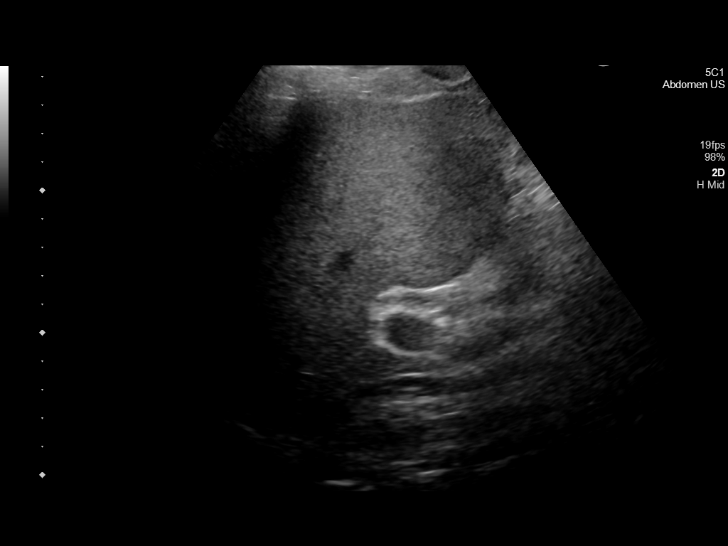
[im 40/48]
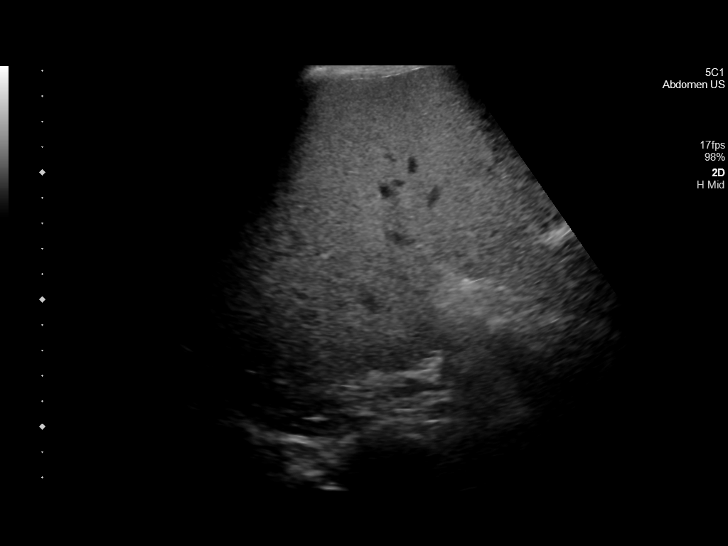
[im 44/48]
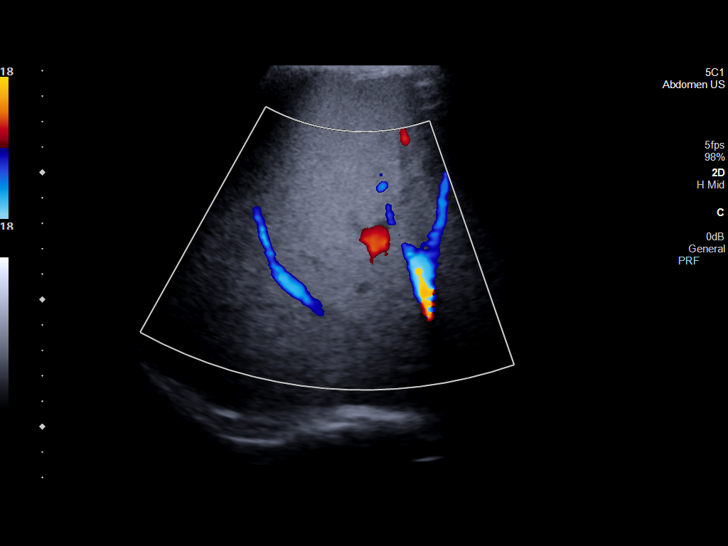
[im 48/48]
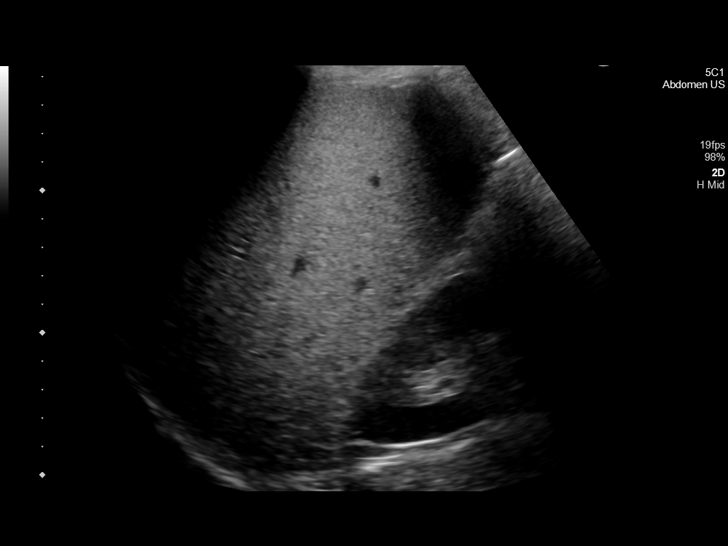

[14 of 25 positions shown; findings below may reference images not displayed]

FINDINGS: Gallbladder:

No gallstones or wall thickening visualized. No sonographic Murphy
sign noted by sonographer.

Common bile duct:

Diameter: 4 mm, normal

Liver:

No focal lesion identified. Diffusely increased hepatic parenchymal
echogenicity. Portal vein is patent on color Doppler imaging with
normal direction of blood flow towards the liver.

Other: None.
IMPRESSION: Hepatic steatosis.

## 2021-02-28 MED ORDER — INSULIN ASPART 100 UNIT/ML IJ SOLN: 0.0000 [IU] | Freq: Three times a day (TID) | INTRAMUSCULAR | Status: DC

## 2021-02-28 MED ORDER — INSULIN ASPART PROT & ASPART (70-30 MIX) 100 UNIT/ML ~~LOC~~ SUSP
30.0000 [IU] | Freq: Two times a day (BID) | SUBCUTANEOUS | Status: DC
  Filled 2021-02-28: qty 10

## 2021-02-28 MED ORDER — INSULIN ASPART 100 UNIT/ML IJ SOLN
0.0000 [IU] | Freq: Three times a day (TID) | INTRAMUSCULAR | Status: DC
  Administered 2021-03-01: 5 [IU] via SUBCUTANEOUS
  Filled 2021-02-28: qty 1

## 2021-02-28 MED ORDER — INSULIN ASPART 100 UNIT/ML IJ SOLN
0.0000 [IU] | Freq: Every day | INTRAMUSCULAR | Status: DC
  Administered 2021-02-28: 22:00:00 5 [IU] via SUBCUTANEOUS
  Filled 2021-02-28: qty 1

## 2021-02-28 MED ORDER — SODIUM CHLORIDE 0.9 % IV SOLN: INTRAVENOUS | Status: DC

## 2021-02-28 MED ORDER — ONDANSETRON HCL 4 MG/2ML IJ SOLN
4.0000 mg | Freq: Four times a day (QID) | INTRAMUSCULAR | Status: DC | PRN
  Administered 2021-03-01: 20:00:00 4 mg via INTRAVENOUS
  Filled 2021-02-28: qty 2

## 2021-02-28 MED ORDER — MORPHINE SULFATE (PF) 2 MG/ML IV SOLN
2.0000 mg | INTRAVENOUS | Status: DC | PRN
  Administered 2021-02-28 – 2021-03-01 (×2): 2 mg via INTRAVENOUS
  Filled 2021-02-28 (×2): qty 1

## 2021-02-28 MED ORDER — SODIUM CHLORIDE 0.9 % IV BOLUS
1000.0000 mL | Freq: Once | INTRAVENOUS | Status: AC
  Administered 2021-02-28: 11:00:00 1000 mL via INTRAVENOUS

## 2021-02-28 MED ORDER — INSULIN ASPART PROT & ASPART (70-30 MIX) 100 UNIT/ML ~~LOC~~ SUSP
20.0000 [IU] | Freq: Two times a day (BID) | SUBCUTANEOUS | Status: DC
Start: 2021-02-28 — End: 2021-03-01
  Administered 2021-02-28 – 2021-03-01 (×2): 20 [IU] via SUBCUTANEOUS
  Filled 2021-02-28 (×2): qty 10

## 2021-02-28 MED ORDER — MORPHINE SULFATE (PF) 4 MG/ML IV SOLN
4.0000 mg | Freq: Once | INTRAVENOUS | Status: AC
  Administered 2021-02-28: 11:00:00 4 mg via INTRAVENOUS
  Filled 2021-02-28: qty 1

## 2021-02-28 MED ORDER — ONDANSETRON HCL 4 MG/2ML IJ SOLN
4.0000 mg | Freq: Once | INTRAMUSCULAR | Status: AC
  Administered 2021-02-28: 11:00:00 4 mg via INTRAVENOUS
  Filled 2021-02-28: qty 2

## 2021-02-28 MED ORDER — ONDANSETRON 4 MG PO TBDP: 4.0000 mg | ORAL_TABLET | Freq: Three times a day (TID) | ORAL | Status: DC | PRN

## 2021-02-28 MED ORDER — IOHEXOL 300 MG/ML  SOLN
80.0000 mL | Freq: Once | INTRAMUSCULAR | Status: AC | PRN
  Administered 2021-02-28: 12:00:00 80 mL via INTRAVENOUS

## 2021-02-28 MED ORDER — SODIUM CHLORIDE 0.9 % IV SOLN: Freq: Once | INTRAVENOUS | Status: AC

## 2021-02-28 MED ORDER — ENOXAPARIN SODIUM 40 MG/0.4ML IJ SOSY
40.0000 mg | PREFILLED_SYRINGE | INTRAMUSCULAR | Status: DC
  Administered 2021-02-28 – 2021-03-01 (×2): 40 mg via SUBCUTANEOUS
  Filled 2021-02-28 (×2): qty 0.4

## 2021-02-28 MED ORDER — MORPHINE SULFATE (PF) 4 MG/ML IV SOLN
4.0000 mg | Freq: Once | INTRAVENOUS | Status: AC
  Administered 2021-02-28: 16:00:00 4 mg via INTRAVENOUS
  Filled 2021-02-28: qty 1

## 2021-02-28 MED ORDER — HYDROCODONE-ACETAMINOPHEN 5-325 MG PO TABS
1.0000 | ORAL_TABLET | ORAL | Status: DC | PRN
  Administered 2021-02-28 – 2021-03-01 (×3): 2 via ORAL
  Filled 2021-02-28 (×3): qty 2

## 2021-02-28 NOTE — ED Notes (Signed)
Pt taken to MRI  

## 2021-02-28 NOTE — ED Notes (Signed)
Pt given cup of water 

## 2021-02-28 NOTE — H&P (Signed)
History and Physical    Cody Cowan SFK:[id number] DOB: [birth year] DOA: 02/28/2021  PCP: Patient, No Pcp Per (Inactive)  Patient coming from: home  I have personally briefly reviewed patient's old medical records in [place]  Chief Complaint: abdominal pain  HPI: Cody Cowan is a 55 y.o. male with medical history significant of alcohol abuse, pancreatitis, DM2 who presented with abdominal pain.   Hx obtained with Spanish interpretor.  Pt reported abdominal pain started about 3 days ago, located at central abdomen, constant pain, radiating to the back (reported causing kidney pain).  Pt had nausea but no vomiting.  Still having normal BM's.  No fever, dyspnea, chest pain, dysuria, increased swelling.  Pt admitted to drinking alcohol, but hasn't had any alcohol for the past 2 weeks.    ED Course: initial vitals: afebrile, pulse 115, BP 134/99, sating 99% on room air.  Labs notable for Na 126, BG 451, Lipase 571, alk phos 217.  CT a/p showed finding consistent with acute pancreatitis with no necrosis.  US abdomen neg for acute cholecystitis.  MRCP neg for choledocholithiasis.  Pt received IV morphine and IVF in the ED before admission.   Assessment/Plan Principal Problem:   Acute pancreatitis  # Acute pancreatitis --pt had prior hx of pancreatitis in Nov 2021, thought to be due to alcoholic use, however, triglyceride was high during that time as well. ---Lipase 571 on presentation.  Ruled out acute cholecystitis and choledocholithiasis. Plan: --cont MIVF'@125'  --clear liquid diet --triglyceride level --trend lipase to ensure down-trending --Norco 1-2 tablets PRN  # DM2, poorly controlled --A1c 10.2 about 1 month ago --cont home 70/30 at reduced 20u BID --ACHS and SSI  # Alcohol abuse  --pt reported last drink about 2 weeks ago --cessation advised   DVT prophylaxis: Lovenox SQ Code Status: Full code  Family Communication:   Disposition  Plan: home  Consults called: none Level of care: Med-Surg   Review of Systems: As per HPI otherwise complete review of systems negative.   Past Medical History:  Diagnosis Date   Diabetes ([place])     History reviewed. No pertinent surgical history.   reports that he has never smoked. He has never used smokeless tobacco. He reports that he does not currently use alcohol. No history on file for drug use.  No Known Allergies  History reviewed. No pertinent family history.   Prior to Admission medications   Medication Sig Start Date End Date Taking? Authorizing Provider  fenofibrate (TRICOR) 145 MG tablet Take 1 tablet (145 mg total) by mouth once daily. 02/01/21 03/03/21 Yes Antonieta Pert, MD  Insulin Lispro Prot & Lispro (HUMALOG 75/25 MIX) (75-25) 100 UNIT/ML Kwikpen Inject 30 Units into the skin 2 (two) times daily before lunch and supper. 02/01/21 04/02/21 Yes Antonieta Pert, MD  metFORMIN (GLUCOPHAGE-XR) 500 MG 24 hr tablet Take 1 tablet (500 mg total) by mouth 2 (two) times daily with a meal. 02/01/21 03/03/21 Yes Kc, Maren [place], MD  Multiple Vitamin (MULTIVITAMIN WITH MINERALS) TABS tablet Take 1 tablet by mouth daily. 01/17/20  Yes Pahwani, Michell Heinrich, MD  Insulin Pen Needle 32G X 4 MM MISC Use 2 times daily with insulin pen 02/01/21 03/23/21  Antonieta Pert, MD  insulin isophane & regular human KwikPen (NOVOLIN 70/30 KWIKPEN) (70-30) 100 UNIT/ML KwikPen Inject 30 Units into the skin 2 (two) times daily before lunch and supper. 02/01/21 02/01/21  Antonieta Pert, MD    Physical Exam: Vitals:   02/28/21 1300 02/28/21 1330 02/28/21  1400 02/28/21 1846  BP: (!) 180/105 (!) 163/106 (!) 163/99 (!) 178/101  Pulse: (!) 104 100 (!) 104 (!) 117  Resp: '12 11 12 18  ' Temp:    98.6 F (37 C)  TempSrc:    Oral  SpO2: 100% 98% 99% 100%  Weight:      Height:        Constitutional: NAD, AAOx3 HEENT: conjunctivae and lids normal, EOMI CV: No cyanosis.   RESP: normal respiratory effort, on RA Extremities: No  effusions, edema in BLE SKIN: warm, dry Neuro: II - XII grossly intact.   Psych: Normal mood and affect.  Appropriate judgement and reason   Labs on Admission: I have personally reviewed following labs and imaging studies  CBC: Recent Labs  Lab 02/28/21 0958  WBC 7.0  HGB 13.9  HCT 39.7  MCV 97.3  PLT 456*   Basic Metabolic Panel: Recent Labs  Lab 02/28/21 0958  NA 126*  K 3.6  CL 93*  CO2 22  GLUCOSE 451*  BUN 14  CREATININE 0.80  CALCIUM 9.3   GFR: Estimated Creatinine Clearance: 83.9 mL/min (by C-G formula based on SCr of 0.8 mg/dL). Liver Function Tests: Recent Labs  Lab 02/28/21 0958  AST 61*  ALT 69*  ALKPHOS 217*  BILITOT 2.5*  PROT 7.6  ALBUMIN 3.9   Recent Labs  Lab 02/28/21 0958  LIPASE 571*   No results for input(s): AMMONIA in the last 168 hours. Coagulation Profile: No results for input(s): INR, PROTIME in the last 168 hours. Cardiac Enzymes: No results for input(s): CKTOTAL, CKMB, CKMBINDEX, TROPONINI in the last 168 hours. BNP (last 3 results) No results for input(s): PROBNP in the last 8760 hours. HbA1C: No results for input(s): HGBA1C in the last 72 hours. CBG: Recent Labs  Lab 02/28/21 1002  GLUCAP 461*   Lipid Profile: No results for input(s): CHOL, HDL, LDLCALC, TRIG, CHOLHDL, LDLDIRECT in the last 72 hours. Thyroid Function Tests: No results for input(s): TSH, T4TOTAL, FREET4, T3FREE, THYROIDAB in the last 72 hours. Anemia Panel: No results for input(s): VITAMINB12, FOLATE, FERRITIN, TIBC, IRON, RETICCTPCT in the last 72 hours. Urine analysis:    Component Value Date/Time   COLORURINE YELLOW 02/28/2021 1114   APPEARANCEUR CLEAR 02/28/2021 1114   LABSPEC 1.010 02/28/2021 1114   PHURINE 5.5 02/28/2021 1114   GLUCOSEU >1,000 (A) 02/28/2021 1114   HGBUR NEGATIVE 02/28/2021 1114   BILIRUBINUR NEGATIVE 02/28/2021 1114   KETONESUR 40 (A) 02/28/2021 1114   PROTEINUR NEGATIVE 02/28/2021 1114   NITRITE NEGATIVE 02/28/2021  1114   LEUKOCYTESUR NEGATIVE 02/28/2021 1114    Radiological Exams on Admission: CT ABDOMEN PELVIS W CONTRAST  Result Date: 02/28/2021 CLINICAL DATA:  Abdominal pain EXAM: CT ABDOMEN AND PELVIS WITH CONTRAST TECHNIQUE: Multidetector CT imaging of the abdomen and pelvis was performed using the standard protocol following bolus administration of intravenous contrast. CONTRAST:  85m OMNIPAQUE IOHEXOL 300 MG/ML  SOLN COMPARISON:  01/12/2020 FINDINGS: Lower chest: No acute abnormality. Hepatobiliary: No solid liver abnormality is seen. Hepatic steatosis. No gallstones, gallbladder wall thickening, or biliary dilatation. Pancreas: Diffuse peripancreatic fat stranding and fluid about the pancreas and adjacent retroperitoneum. No discrete fluid collection. No parenchymal hypoenhancement. No pancreatic ductal dilatation. Spleen: Normal in size without significant abnormality. Adrenals/Urinary Tract: Adrenal glands are unremarkable. Kidneys are normal, without renal calculi, solid lesion, or hydronephrosis. Bladder is unremarkable. Stomach/Bowel: Stomach is within normal limits. Appendix appears normal. No evidence of bowel wall thickening, distention, or inflammatory changes. Vascular/Lymphatic: Aortic atherosclerosis.  Enlarged portacaval lymph node measuring up to 1.9 x 1.2 cm (series 2, image 31). Reproductive: No mass or other significant abnormality. Other: No abdominal wall hernia or abnormality. No abdominopelvic ascites. Musculoskeletal: No acute or significant osseous findings. IMPRESSION: 1. Diffuse peripancreatic fat stranding and fluid about the pancreas and adjacent retroperitoneum, consistent with acute pancreatitis. No discrete fluid collection or evidence of pancreatic necrosis. 2. Enlarged portacaval lymph node, likely reactive. 3. Hepatic steatosis. Aortic Atherosclerosis (ICD10-I70.0). Electronically Signed   By: Delanna Ahmadi M.D.   On: 02/28/2021 11:56   MR ABDOMEN MRCP WO CONTRAST  Result  Date: 02/28/2021 CLINICAL DATA:  Pancreatitis on CT scan earlier today. EXAM: MRI ABDOMEN WITHOUT CONTRAST  (INCLUDING MRCP) TECHNIQUE: Multiplanar multisequence MR imaging of the abdomen was performed. Heavily T2-weighted images of the biliary and pancreatic ducts were obtained, and three-dimensional MRCP images were rendered by post processing. COMPARISON:  CT scan and ultrasound exam earlier same day. FINDINGS: Lower chest: Unremarkable. Hepatobiliary: No focal abnormality in the liver on this study without intravenous contrast. Gallbladder is distended with potential but not definite 2 mm stone towards the fundus (coronal T2 haste image 11 of series 5). No intrahepatic or extrahepatic biliary duct dilatation. Extrahepatic common duct measures 4 mm diameter. Common bile duct in the head of the pancreas appears attenuated (see coronal 3D MRCP image 34 of series 14 and coronal thin MRCP image 10 of series 16), possibly from edema in the head of the pancreas. Pancreas: As seen on CT, there is diffuse edema in the pancreas and in the peripancreatic fat. No main duct dilatation. Spleen:  No splenomegaly. No focal mass lesion. Adrenals/Urinary Tract: No right adrenal nodule or mass. 13 mm left adrenal nodule shows loss of signal intensity on out of phase T1 imaging compatible with adenoma. Kidneys unremarkable. Stomach/Bowel: Stomach is nondistended. Peri-duodenal edema evident. No small bowel or colonic dilatation within the visualized abdomen. Vascular/Lymphatic: No abdominal aortic aneurysm. No abdominal lymphadenopathy. Other: Small volume free fluid noted around the liver with extensive edema in the retroperitoneal space of the abdomen and tracking into the hepatoduodenal ligament and region of gallbladder fossa. Musculoskeletal: No suspicious marrow signal abnormality. IMPRESSION: 1. Possible but not definite tiny 2 mm gallstone. No evidence for choledocholithiasis. No intra or extrahepatic biliary duct  dilatation. 2. Edema in and around the pancreas consistent with acute pancreatitis. There is some edema in the wall of the descending duodenum and in the Peri duodenal fat. Duodenitis/peptic ulcer disease a possibility but considered less likely by imaging. 3. Attenuation of the common bile duct in the head of the pancreas, potentially secondary the pancreatic edema. No intrahepatic or extrahepatic biliary duct dilatation. No evidence for choledocholithiasis. 4. 13 mm left adrenal adenoma. Electronically Signed   By: Misty Stanley M.D.   On: 02/28/2021 15:24   US ABDOMEN LIMITED RUQ (LIVER/GB)  Result Date: 02/28/2021 CLINICAL DATA:  elevated lfts, upper abd pain EXAM: ULTRASOUND ABDOMEN LIMITED RIGHT UPPER QUADRANT COMPARISON:  January 28, 2021; same day CT FINDINGS: Gallbladder: No gallstones or wall thickening visualized. No sonographic Murphy sign noted by sonographer. Common bile duct: Diameter: 4 mm, normal Liver: No focal lesion identified. Diffusely increased hepatic parenchymal echogenicity. Portal vein is patent on color Doppler imaging with normal direction of blood flow towards the liver. Other: None. IMPRESSION: Hepatic steatosis. Electronically Signed   By: Valentino Saxon M.D.   On: 02/28/2021 13:26      Enzo Bi MD [facility]  If 7PM-7AM, please contact night-coverage  02/28/2021, 8:15 PM

## 2021-02-28 NOTE — ED Provider Notes (Signed)
[facility] Emergency Department Provider Note  Time seen: 10:27 AM  I have reviewed the triage vital signs and the nursing notes.   HISTORY  Chief Complaint Abdominal Pain   HPI Cody Cowan is a 55 y.o. male with a past medical history of diabetes on insulin, past history of alcohol abuse although denies any alcohol use recently presents to the emergency department for abdominal pain.  According to the patient for the past 3 days he has been experiencing abdominal pain in the mid to upper abdomen.  Describes as a moderate aching type pain.  States he has been nauseated with retching as well.  No diarrhea.  No fever.  No dysuria.  No cough or congestion.  Last alcohol intake was 15 days ago per patient.   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 No pertinent surgical history.  Prior to Admission medications   Medication Sig Start Date End Date Taking? Authorizing Provider  fenofibrate (TRICOR) 145 MG tablet Take 1 tablet (145 mg total) by mouth once daily. 02/01/21 03/03/21  Lanae Boast, MD  Insulin Lispro Prot & Lispro (HUMALOG 75/25 MIX) (75-25) 100 UNIT/ML Kwikpen Inject 30 Units into the skin 2 (two) times daily before lunch and supper. 02/01/21 04/02/21  Lanae Boast, MD  Insulin Pen Needle 32G X 4 MM MISC Use 2 times daily with insulin pen 02/01/21 03/23/21  Lanae Boast, MD  metFORMIN (GLUCOPHAGE-XR) 500 MG 24 hr tablet Take 1 tablet (500 mg total) by mouth 2 (two) times daily with a meal. 02/01/21 03/03/21  Lanae Boast, MD  Multiple Vitamin (MULTIVITAMIN WITH MINERALS) TABS  tablet Take 1 tablet by mouth daily. 01/17/20   Pahwani, Kasandra Knudsen, MD  insulin isophane & regular human KwikPen (NOVOLIN 70/30 KWIKPEN) (70-30) 100 UNIT/ML KwikPen Inject 30 Units into the skin 2 (two) times daily before lunch and supper. 02/01/21 02/01/21  Lanae Boast, MD    No Known Allergies  History reviewed. No pertinent family history.  Social History Social History   Tobacco Use   Smoking status: Never   Smokeless tobacco: Never  Substance Use Topics   Alcohol use: Not Currently    Review of Systems Constitutional: Negative for fever. Cardiovascular: Negative for chest pain. Respiratory: Negative for shortness of breath. Gastrointestinal: Positive for moderate upper abdominal pain nausea. Genitourinary: Negative for urinary compaints Musculoskeletal: Negative for musculoskeletal complaints Neurological: Negative for headache All other ROS negative  ____________________________________________   PHYSICAL EXAM:  VITAL SIGNS: ED Triage Vitals  Enc Vitals Group     BP 02/28/21 0952 (!) 134/99     Pulse Rate 02/28/21 0952 (!) 115     Resp 02/28/21 0952 18     Temp 02/28/21 0952 97.9 F (36.6 C)     Temp Source 02/28/21 0952 Oral     SpO2 02/28/21 0952 99 %     Weight 02/28/21 0952 124 lb (56.2 kg)     Height 02/28/21 0952 5\' 6"  (1.676 m)     Head Circumference --      Peak Flow --      Pain Score 02/28/21 0956 10  Pain Loc --      Pain Edu? --      Excl. in GC? --     Constitutional: Alert and oriented.  Appears mildly uncomfortable. Eyes: Normal exam ENT      Head: Normocephalic and atraumatic.      Mouth/Throat: Mucous membranes are moist. Cardiovascular: Normal rate, regular rhythm. Respiratory: Normal respiratory effort without tachypnea nor retractions. Breath sounds are clear  Gastrointestinal: Soft and nontender. No distention.  Musculoskeletal: Nontender with normal range of motion in all extremities. Neurologic:  Normal speech and language. No  gross focal neurologic deficits  Skin:  Skin is warm, dry and intact.  Psychiatric: Mood and affect are normal.  ____________________________________________    EKG  EKG viewed and interpreted by myself shows sinus tachycardia 114 bpm with a narrow QRS, normal axis, normal intervals, no concerning ST changes.  ____________________________________________    RADIOLOGY  CT scan shows stranding around the pancreas consistent with acute pancreatitis. Ultrasound shows normal-appearing gallbladder, hepatic steatosis ____________________________________________   INITIAL IMPRESSION / ASSESSMENT AND PLAN / ED COURSE  Pertinent labs & imaging results that were available during my care of the patient were reviewed by me and considered in my medical decision making (see chart for details).   Patient presents to the emergency department for upper abdominal pain worsening over the past several days.  Lab work shows elevated LFTs as well as lipase.  LFTs are somewhat similar to past values however his total bilirubin has increased to 2.5 I spoke to Dr. Maximino Greenland of GI medicine who recommends getting MRCP.  As long as MRCP does not show any common bile duct stones we will admit locally to the hospitalist service for pain control and fluids as well as bowel rest.  If MRCP shows a common stone patient will require transfer.  Pain is controlled with pain medication and IV fluids.  Patient care signed out to oncoming provider.  Cody Cowan was evaluated in Emergency Department on 02/28/2021 for the symptoms described in the history of present illness. He was evaluated in the context of the global COVID-19 pandemic, which necessitated consideration that the patient might be at risk for infection with the SARS-CoV-2 virus that causes COVID-19. Institutional protocols and algorithms that pertain to the evaluation of patients at risk for COVID-19 are in a state of rapid change based on  information released by regulatory bodies including the CDC and federal and state organizations. These policies and algorithms were followed during the patient's care in the ED.  ____________________________________________   FINAL CLINICAL IMPRESSION(S) / ED DIAGNOSES  Acute pancreatitis   Minna Antis, MD 02/28/21 1407

## 2021-02-28 NOTE — Plan of Care (Signed)

## 2021-02-28 NOTE — ED Triage Notes (Signed)
Pt via POV from home. Pt c/o mid to upper abd pain and back pain that started 3 days ago. Pt states this AM he endorses nausea but no vomiting. Denies diarrhea. Denies fever. Denies abd surgeries. Pt does not drink or smoke.   Pt needs spanish interpreter.

## 2021-03-01 DIAGNOSIS — E781 Pure hyperglyceridemia: Secondary | ICD-10-CM

## 2021-03-01 DIAGNOSIS — K852 Alcohol induced acute pancreatitis without necrosis or infection: Secondary | ICD-10-CM

## 2021-03-01 LAB — COMPREHENSIVE METABOLIC PANEL
ALT: 41 U/L (ref 0–44)
AST: 37 U/L (ref 15–41)
Albumin: 3.3 g/dL — ABNORMAL LOW (ref 3.5–5.0)
Alkaline Phosphatase: 115 U/L (ref 38–126)
Anion gap: 5 (ref 5–15)
BUN: 6 mg/dL (ref 6–20)
CO2: 24 mmol/L (ref 22–32)
Calcium: 8.9 mg/dL (ref 8.9–10.3)
Chloride: 100 mmol/L (ref 98–111)
Creatinine, Ser: 0.4 mg/dL — ABNORMAL LOW (ref 0.61–1.24)
GFR, Estimated: >60 mL/min (ref >60)
Glucose, Bld: 148 mg/dL — ABNORMAL HIGH (ref 70–99)
Potassium: 3.8 mmol/L (ref 3.5–5.1)
Sodium: 129 mmol/L — ABNORMAL LOW (ref 135–145)
Total Bilirubin: 1.5 mg/dL — ABNORMAL HIGH (ref 0.3–1.2)
Total Protein: 6.7 g/dL (ref 6.5–8.1)

## 2021-03-01 LAB — CBC
HCT: 34.2 % — ABNORMAL LOW (ref 39.0–52.0)
Hemoglobin: 11.9 g/dL — ABNORMAL LOW (ref 13.0–17.0)
MCH: 34.1 pg — ABNORMAL HIGH (ref 26.0–34.0)
MCHC: 34.8 g/dL (ref 30.0–36.0)
MCV: 98 fL (ref 80.0–100.0)
Platelets: 119 10*3/uL — ABNORMAL LOW (ref 150–400)
RBC: 3.49 MIL/uL — ABNORMAL LOW (ref 4.22–5.81)
RDW: 12.1 % (ref 11.5–15.5)
WBC: 5.8 10*3/uL (ref 4.0–10.5)
nRBC: 0 % (ref 0.0–0.2)

## 2021-03-01 LAB — BASIC METABOLIC PANEL
Anion gap: 5 (ref 5–15)
BUN: <5 mg/dL — ABNORMAL LOW (ref 6–20)
CO2: 25 mmol/L (ref 22–32)
Calcium: 8.9 mg/dL (ref 8.9–10.3)
Chloride: 102 mmol/L (ref 98–111)
Creatinine, Ser: 0.4 mg/dL — ABNORMAL LOW (ref 0.61–1.24)
GFR, Estimated: >60 mL/min (ref >60)
Glucose, Bld: 122 mg/dL — ABNORMAL HIGH (ref 70–99)
Potassium: 2.8 mmol/L — ABNORMAL LOW (ref 3.5–5.1)
Sodium: 132 mmol/L — ABNORMAL LOW (ref 135–145)

## 2021-03-01 LAB — GLUCOSE, CAPILLARY
Glucose-Capillary: 80 mg/dL (ref 70–99)
Glucose-Capillary: 159 mg/dL — ABNORMAL HIGH (ref 70–99)
Glucose-Capillary: 217 mg/dL — ABNORMAL HIGH (ref 70–99)
Glucose-Capillary: 12 mg/dL — CL (ref 70–99)
Glucose-Capillary: 153 mg/dL — ABNORMAL HIGH (ref 70–99)
Glucose-Capillary: 256 mg/dL — ABNORMAL HIGH (ref 70–99)

## 2021-03-01 LAB — LACTIC ACID, PLASMA: Lactic Acid, Venous: 1 mmol/L (ref 0.5–1.9)

## 2021-03-01 LAB — MAGNESIUM: Magnesium: 1.9 mg/dL (ref 1.7–2.4)

## 2021-03-01 LAB — TRIGLYCERIDES: Triglycerides: 365 mg/dL — ABNORMAL HIGH (ref <150)

## 2021-03-01 LAB — LIPASE, BLOOD: Lipase: 83 U/L — ABNORMAL HIGH (ref 11–51)

## 2021-03-01 MED ORDER — DEXTROSE 50 % IV SOLN
INTRAVENOUS | Status: AC
  Administered 2021-03-01: 19:00:00 50 mL
  Filled 2021-03-01: qty 50

## 2021-03-01 MED ORDER — POTASSIUM CHLORIDE CRYS ER 20 MEQ PO TBCR
40.0000 meq | EXTENDED_RELEASE_TABLET | ORAL | Status: AC
  Administered 2021-03-01 – 2021-03-02 (×2): 40 meq via ORAL
  Filled 2021-03-01 (×2): qty 2

## 2021-03-01 MED ORDER — LACTATED RINGERS IV SOLN
INTRAVENOUS | Status: DC
Start: 2021-03-01 — End: 2021-03-02

## 2021-03-01 MED ORDER — INSULIN ASPART 100 UNIT/ML IJ SOLN
0.0000 [IU] | Freq: Three times a day (TID) | INTRAMUSCULAR | Status: DC
  Administered 2021-03-02: 12:00:00 7 [IU] via SUBCUTANEOUS
  Administered 2021-03-02: 09:00:00 5 [IU] via SUBCUTANEOUS
  Filled 2021-03-01 (×2): qty 1

## 2021-03-01 MED ORDER — DEXTROSE 10 % IV SOLN: INTRAVENOUS | Status: DC

## 2021-03-01 NOTE — Significant Event (Signed)
Responded to rapid response. Found patient unresponsive, able to maintain airway. Staff present. Was told patient was hypoglycemic. When patient was conscious he was given oral glucose, juice. Was told repeat CBG was 10. Perr RR protocol ordered 1 amp d50. Dr. Laurette Schimke arrived to room. After being given D50, patient became responsive, was talking, moving in bed. Notified ICU CN of rapid situation. She was present at code blue in another room and unable to leave to go to rapid.

## 2021-03-01 NOTE — Progress Notes (Signed)
Patient diaphoretic and drowsy gave 360 of juice immediately and CBG was 12, called rapid response and paged Hospitalist. Response was immediate. Patients symptoms worsened and became unresponsive. Repeat sugar was <10 amp of d50 given patient became responsive. D 10 started at 125/hr. Diet changed and order placed. Repeat cbg 154. Patient is alert at bedside. Lab is present to draw stat serum.

## 2021-03-01 NOTE — Progress Notes (Addendum)
PROGRESS NOTE    Cody Cowan  GOT:[id number] DOB: 1965/09/01 DOA: 02/28/2021 PCP: Patient, No Pcp Per (Inactive)  215A/215A-AA   Assessment & Plan:   Principal Problem:   Acute pancreatitis   Cody Cowan is a 55 y.o. male with medical history significant of alcohol abuse, pancreatitis, DM2 who presented with abdominal pain.    # Acute pancreatitis, presumed alcoholic --pt had prior hx of pancreatitis in Nov 2021, thought to be due to alcoholic use, however, triglyceride was high during that time as well, >1900's --Lipase 571 on presentation.  Ruled out acute cholecystitis and choledocholithiasis. Plan: --cont MIVF --advance to low-fat diet --GI consult --Norco 1-2 tablets PRN  # Hypertriglyceridemia  --Lipase 571 on presentation.  --on home fenofibrate --GI consult today   # DM2, poorly controlled # Hypoglycemia  --A1c 10.2 about 1 month ago.  Reported taking 70/30 Novolog 25u BID at home, but A1c still elevated. --had rapid call today for severe hypoglycemic event, with BG 12.  Had only received 1 dose of Novolog 20u today, but has been on clear liquid diet. --hold 70/30 insulin --start D10 gtt --BG checks q2h for 4x, and if not low, then back to ACHS.   # Alcohol abuse  --pt reported last drink about 2 weeks ago --cessation advised.   DVT prophylaxis: Lovenox SQ Code Status: Full code  Family Communication:  Level of care: Med-Surg Dispo:   The patient is from: home Anticipated d/c is to: home Anticipated d/c date is: 1-2 days Patient currently is not medically ready to d/c due to: IVF for pancreatitis   Subjective and Interval History:  Pt reported abdominal pain still there a little bit, but improved from prior.    GI consult today to assess for elevated triglyceride as an etiology of pt's recurrent pancreatitis.  At the end of the day shift, pt had a hypoglycemic event and rapid called.  Corrected with D50 and started on  D10 infusion.     Objective: Vitals:   03/01/21 1528 03/01/21 2000 03/02/21 0404 03/02/21 0822  BP: (!) 157/98 (!) 162/109 (!) 168/104 (!) 166/87  Pulse: (!) 101 80 88 81  Resp: 18 16 12    Temp: 98.7 F (37.1 C) (!) 97.5 F (36.4 C) 97.8 F (36.6 C) 98.3 F (36.8 C)  TempSrc: Oral Oral Oral Oral  SpO2: 98% 100% 100% 96%  Weight:      Height:        Intake/Output Summary (Last 24 hours) at 03/02/2021 1956 Last data filed at 03/02/2021 1000 Gross per 24 hour  Intake 850.26 ml  Output --  Net 850.26 ml   Filed Weights   02/28/21 0952  Weight: 56.2 kg    Examination:   Constitutional: NAD, AAOx3 HEENT: conjunctivae and lids normal, EOMI CV: No cyanosis.   RESP: normal respiratory effort, on RA Extremities: No effusions, edema in BLE SKIN: warm, dry Neuro: II - XII grossly intact.   Psych: Normal mood and affect.  Appropriate judgement and reason   Data Reviewed: I have personally reviewed following labs and imaging studies  CBC: Recent Labs  Lab 02/28/21 0958 03/01/21 1300 03/02/21 0556  WBC 7.0 5.8 4.6  HGB 13.9 11.9* 11.8*  HCT 39.7 34.2* 34.4*  MCV 97.3 98.0 96.9  PLT 135* 119* 123*   Basic Metabolic Panel: Recent Labs  Lab 02/28/21 0958 03/01/21 1300 03/01/21 1918 03/02/21 0556  NA 126* 129* 132* 133*  K 3.6 3.8 2.8* 4.0  CL 93* 100 102  101  CO2 22 24 25 25   GLUCOSE 451* 148* 122* 266*  BUN 14 6 <5* 6  CREATININE 0.80 0.40* 0.40* 0.55*  CALCIUM 9.3 8.9 8.9 9.3  MG  --  1.9  --  2.1   GFR: Estimated Creatinine Clearance: 83.9 mL/min (A) (by C-G formula based on SCr of 0.55 mg/dL (L)). Liver Function Tests: Recent Labs  Lab 02/28/21 0958 03/01/21 1300  AST 61* 37  ALT 69* 41  ALKPHOS 217* 115  BILITOT 2.5* 1.5*  PROT 7.6 6.7  ALBUMIN 3.9 3.3*   Recent Labs  Lab 02/28/21 0958 03/01/21 1300  LIPASE 571* 83*   No results for input(s): AMMONIA in the last 168 hours. Coagulation Profile: No results for input(s): INR, PROTIME in  the last 168 hours. Cardiac Enzymes: No results for input(s): CKTOTAL, CKMB, CKMBINDEX, TROPONINI in the last 168 hours. BNP (last 3 results) No results for input(s): PROBNP in the last 8760 hours. HbA1C: No results for input(s): HGBA1C in the last 72 hours. CBG: Recent Labs  Lab 03/01/21 2147 03/02/21 0015 03/02/21 0220 03/02/21 0820 03/02/21 1142  GLUCAP 256* 259* 235* 279* 338*   Lipid Profile: Recent Labs    02/28/21 0958 03/01/21 1300  TRIG 871* 365*   Thyroid Function Tests: No results for input(s): TSH, T4TOTAL, FREET4, T3FREE, THYROIDAB in the last 72 hours. Anemia Panel: No results for input(s): VITAMINB12, FOLATE, FERRITIN, TIBC, IRON, RETICCTPCT in the last 72 hours. Sepsis Labs: Recent Labs  Lab 03/01/21 1300  LATICACIDVEN 1.0    Recent Results (from the past 240 hour(s))  Resp Panel by RT-PCR (Flu A&B, Covid) Nasopharyngeal Swab     Status: None   Collection Time: 02/28/21 11:14 AM   Specimen: Nasopharyngeal Swab; Nasopharyngeal(NP) swabs in vial transport medium  Result Value Ref Range Status   SARS Coronavirus 2 by RT PCR NEGATIVE NEGATIVE Final    Comment: (NOTE) SARS-CoV-2 target nucleic acids are NOT DETECTED.  The SARS-CoV-2 RNA is generally detectable in upper respiratory specimens during the acute phase of infection. The lowest concentration of SARS-CoV-2 viral copies this assay can detect is 138 copies/mL. A negative result does not preclude SARS-Cov-2 infection and should not be used as the sole basis for treatment or other patient management decisions. A negative result may occur with  improper specimen collection/handling, submission of specimen other than nasopharyngeal swab, presence of viral mutation(s) within the areas targeted by this assay, and inadequate number of viral copies(<138 copies/mL). A negative result must be combined with clinical observations, patient history, and epidemiological information. The expected result is  Negative.  Fact Sheet for Patients:  03/02/21  Fact Sheet for Healthcare Providers:  BloggerCourse.com  This test is no t yet approved or cleared by the [employer] FDA and  has been authorized for detection and/or diagnosis of SARS-CoV-2 by FDA under an Emergency Use Authorization (EUA). This EUA will remain  in effect (meaning this test can be used) for the duration of the COVID-19 declaration under Section 564(b)(1) of the Act, 21 U.S.C.section 360bbb-3(b)(1), unless the authorization is terminated  or revoked sooner.       Influenza A by PCR NEGATIVE NEGATIVE Final   Influenza B by PCR NEGATIVE NEGATIVE Final    Comment: (NOTE) The Xpert Xpress SARS-CoV-2/FLU/RSV plus assay is intended as an aid in the diagnosis of influenza from Nasopharyngeal swab specimens and should not be used as a sole basis for treatment. Nasal washings and aspirates are unacceptable for Xpert Xpress SARS-CoV-2/FLU/RSV testing.  Fact Sheet for Patients: BloggerCourse.com  Fact Sheet for Healthcare Providers: [employer]ious[employer]ker[employer]it  This test is not yet approved or cleared by the [place] FDA and has been authorized for detection and/or diagnosis of SARS-CoV-2 by FDA under an Emergency Use Authorization (EUA). This EUA will remain in effect (meaning this test can be used) for the duration of the COVID-19 declaration under Section 564(b)(1) of the Act, 21 U.S.C. section 360bbb-3(b)(1), unless the authorization is terminated or revoked.  Performed at [facility], [address] [place], [place] [address]       Radiology Studies: No results found.   Scheduled Meds:  enoxaparin (LOVENOX) injection  40 mg Subcutaneous Q24H   insulin aspart  0-9 Units Subcutaneous TID WC   Continuous Infusions:  lactated ringers 75 mL/hr at 03/02/21 0316     LOS: 2 days   Critical care  time: 35 minutes.  Pt had BG 12 and mental status change.  Rapid was called.  I went to the bedside and directed the care.     Darlin Priestly, MD [facility] If 7PM-7AM, please contact night-coverage 03/02/2021, 7:56 PM

## 2021-03-01 NOTE — Consult Note (Signed)
Melodie Bouillon, MD [address], [place], [place], [address] [address], [address], [place], [place], [address] Phone: [phone]  Fax: [phone]  Consultation  Referring Provider:     Dr. Fran Lowes Primary Care Physician:  Patient, No Pcp Per (Inactive) Reason for Consultation:     Pancreatitis  Date of Admission:  02/28/2021 Date of Consultation:  03/01/2021         HPI:   Cody Cowan is a 55 y.o. male with history of alcohol use daily, last drink being 20 days ago as per patient, presented with abdominal pain that started 3 days ago, epigastric region, no radiation, nausea but no vomiting.  Found to have pancreatitis on imaging and based on labs and symptoms.  States abdominal pain is improved at this time from 10/10 to 5/10.  Describes the pain as sharp.  Patient also had an episode of pancreatitis in November 2020 through 1 and was managed conservatively at that time.  He was noted to have hypertriglyceridemia and as per his discharge summary was sent home on fibrate.  On this admission liver enzymes were noted to be elevated, and MRCP did not show any choledocholithiasis  Triglycerides were noted to be 871 on admission and are 365 today  Past Medical History:  Diagnosis Date   Diabetes (HCC)     History reviewed. No pertinent surgical history.  Prior to Admission medications   Medication Sig Start Date End Date Taking? Authorizing Provider  fenofibrate (TRICOR) 145 MG tablet Take 1 tablet (145 mg total) by mouth once daily. 02/01/21 03/03/21 Yes Lanae Boast, MD  Insulin Lispro Prot & Lispro (HUMALOG 75/25 MIX) (75-25) 100 UNIT/ML Kwikpen Inject 30 Units into the skin 2 (two) times daily before lunch and supper. 02/01/21 04/02/21 Yes Lanae Boast, MD  metFORMIN (GLUCOPHAGE-XR) 500 MG 24 hr tablet Take 1 tablet (500 mg total) by mouth 2 (two) times daily with a meal. 02/01/21 03/03/21 Yes Kc, Dayna Barker, MD  Multiple Vitamin (MULTIVITAMIN WITH  MINERALS) TABS tablet Take 1 tablet by mouth daily. 01/17/20  Yes Pahwani, Kasandra Knudsen, MD  Insulin Pen Needle 32G X 4 MM MISC Use 2 times daily with insulin pen 02/01/21 03/23/21  Lanae Boast, MD  insulin isophane & regular human KwikPen (NOVOLIN 70/30 KWIKPEN) (70-30) 100 UNIT/ML KwikPen Inject 30 Units into the skin 2 (two) times daily before lunch and supper. 02/01/21 02/01/21  Lanae Boast, MD    History reviewed. No pertinent family history.   Social History   Tobacco Use   Smoking status: Never   Smokeless tobacco: Never  Substance Use Topics   Alcohol use: Not Currently    Allergies as of 02/28/2021   (No Known Allergies)    Review of Systems:    All systems reviewed and negative except where noted in HPI.   Physical Exam:  Constitutional: General:   Alert,  Well-developed, well-nourished, pleasant and cooperative in NAD BP (!) 157/98 (BP Location: Right Arm)    Pulse (!) 101    Temp 98.7 F (37.1 C) (Oral)    Resp 18    Ht 5\' 6"  (1.676 m)    Wt 56.2 kg    SpO2 98%    BMI 20.01 kg/m   Eyes:  Sclera clear, no icterus.   Conjunctiva pink. PERRLA  Ears:  No scars, lesions or masses, Normal auditory acuity. Nose:  No deformity, discharge, or lesions. Mouth:  No deformity or lesions, oropharynx pink & moist.  Neck:  Supple;  no masses or thyromegaly.  Respiratory: Normal respiratory effort, Normal percussion  Gastrointestinal:  Normal bowel sounds.  No bruits.  Soft, non-tender and non-distended without masses, hepatosplenomegaly or hernias noted.  No guarding or rebound tenderness.     Cardiac: No clubbing or edema.  No cyanosis. Normal posterior tibial pedal pulses noted.  Lymphatic:  No significant cervical or axillary adenopathy.  Psych:  Alert and cooperative. Normal mood and affect.  Musculoskeletal:  Normal gait. Head normocephalic, atraumatic. Symmetrical without gross deformities. 5/5 Upper and Lower extremity strength bilaterally.  Skin: Warm. Intact without  significant lesions or rashes. No jaundice.  Neurologic:  Face symmetrical, tongue midline, Normal sensation to touch;  grossly normal neurologically.  Psych:  Alert and oriented x3, Alert and cooperative. Normal mood and affect.   LAB RESULTS: Recent Labs    02/28/21 0958 03/01/21 1300  WBC 7.0 5.8  HGB 13.9 11.9*  HCT 39.7 34.2*  PLT 135* 119*   BMET Recent Labs    02/28/21 0958 03/01/21 1300  NA 126* 129*  K 3.6 3.8  CL 93* 100  CO2 22 24  GLUCOSE 451* 148*  BUN 14 6  CREATININE 0.80 0.40*  CALCIUM 9.3 8.9   LFT Recent Labs    03/01/21 1300  PROT 6.7  ALBUMIN 3.3*  AST 37  ALT 41  ALKPHOS 115  BILITOT 1.5*   PT/INR No results for input(s): LABPROT, INR in the last 72 hours.  STUDIES: CT ABDOMEN PELVIS W CONTRAST  Result Date: 02/28/2021 CLINICAL DATA:  Abdominal pain EXAM: CT ABDOMEN AND PELVIS WITH CONTRAST TECHNIQUE: Multidetector CT imaging of the abdomen and pelvis was performed using the standard protocol following bolus administration of intravenous contrast. CONTRAST:  62mL OMNIPAQUE IOHEXOL 300 MG/ML  SOLN COMPARISON:  01/12/2020 FINDINGS: Lower chest: No acute abnormality. Hepatobiliary: No solid liver abnormality is seen. Hepatic steatosis. No gallstones, gallbladder wall thickening, or biliary dilatation. Pancreas: Diffuse peripancreatic fat stranding and fluid about the pancreas and adjacent retroperitoneum. No discrete fluid collection. No parenchymal hypoenhancement. No pancreatic ductal dilatation. Spleen: Normal in size without significant abnormality. Adrenals/Urinary Tract: Adrenal glands are unremarkable. Kidneys are normal, without renal calculi, solid lesion, or hydronephrosis. Bladder is unremarkable. Stomach/Bowel: Stomach is within normal limits. Appendix appears normal. No evidence of bowel wall thickening, distention, or inflammatory changes. Vascular/Lymphatic: Aortic atherosclerosis. Enlarged portacaval lymph node measuring up to 1.9 x  1.2 cm (series 2, image 31). Reproductive: No mass or other significant abnormality. Other: No abdominal wall hernia or abnormality. No abdominopelvic ascites. Musculoskeletal: No acute or significant osseous findings. IMPRESSION: 1. Diffuse peripancreatic fat stranding and fluid about the pancreas and adjacent retroperitoneum, consistent with acute pancreatitis. No discrete fluid collection or evidence of pancreatic necrosis. 2. Enlarged portacaval lymph node, likely reactive. 3. Hepatic steatosis. Aortic Atherosclerosis (ICD10-I70.0). Electronically Signed   By: Jearld Lesch M.D.   On: 02/28/2021 11:56   MR ABDOMEN MRCP WO CONTRAST  Result Date: 02/28/2021 CLINICAL DATA:  Pancreatitis on CT scan earlier today. EXAM: MRI ABDOMEN WITHOUT CONTRAST  (INCLUDING MRCP) TECHNIQUE: Multiplanar multisequence MR imaging of the abdomen was performed. Heavily T2-weighted images of the biliary and pancreatic ducts were obtained, and three-dimensional MRCP images were rendered by post processing. COMPARISON:  CT scan and ultrasound exam earlier same day. FINDINGS: Lower chest: Unremarkable. Hepatobiliary: No focal abnormality in the liver on this study without intravenous contrast. Gallbladder is distended with potential but not definite 2 mm stone towards the fundus (coronal T2 haste image 11 of series  5). No intrahepatic or extrahepatic biliary duct dilatation. Extrahepatic common duct measures 4 mm diameter. Common bile duct in the head of the pancreas appears attenuated (see coronal 3D MRCP image 34 of series 14 and coronal thin MRCP image 10 of series 16), possibly from edema in the head of the pancreas. Pancreas: As seen on CT, there is diffuse edema in the pancreas and in the peripancreatic fat. No main duct dilatation. Spleen:  No splenomegaly. No focal mass lesion. Adrenals/Urinary Tract: No right adrenal nodule or mass. 13 mm left adrenal nodule shows loss of signal intensity on out of phase T1 imaging compatible  with adenoma. Kidneys unremarkable. Stomach/Bowel: Stomach is nondistended. Peri-duodenal edema evident. No small bowel or colonic dilatation within the visualized abdomen. Vascular/Lymphatic: No abdominal aortic aneurysm. No abdominal lymphadenopathy. Other: Small volume free fluid noted around the liver with extensive edema in the retroperitoneal space of the abdomen and tracking into the hepatoduodenal ligament and region of gallbladder fossa. Musculoskeletal: No suspicious marrow signal abnormality. IMPRESSION: 1. Possible but not definite tiny 2 mm gallstone. No evidence for choledocholithiasis. No intra or extrahepatic biliary duct dilatation. 2. Edema in and around the pancreas consistent with acute pancreatitis. There is some edema in the wall of the descending duodenum and in the Peri duodenal fat. Duodenitis/peptic ulcer disease a possibility but considered less likely by imaging. 3. Attenuation of the common bile duct in the head of the pancreas, potentially secondary the pancreatic edema. No intrahepatic or extrahepatic biliary duct dilatation. No evidence for choledocholithiasis. 4. 13 mm left adrenal adenoma. Electronically Signed   By: Kennith Center M.D.   On: 02/28/2021 15:24   US ABDOMEN LIMITED RUQ (LIVER/GB)  Result Date: 02/28/2021 CLINICAL DATA:  elevated lfts, upper abd pain EXAM: ULTRASOUND ABDOMEN LIMITED RIGHT UPPER QUADRANT COMPARISON:  January 28, 2021; same day CT FINDINGS: Gallbladder: No gallstones or wall thickening visualized. No sonographic Murphy sign noted by sonographer. Common bile duct: Diameter: 4 mm, normal Liver: No focal lesion identified. Diffusely increased hepatic parenchymal echogenicity. Portal vein is patent on color Doppler imaging with normal direction of blood flow towards the liver. Other: None. IMPRESSION: Hepatic steatosis. Electronically Signed   By: Meda Klinefelter M.D.   On: 02/28/2021 13:26      Impression / Plan:   Cody Cowan is  a 55 y.o. y/o male with alcoholic pancreatitis  Abdominal pain is improving at this time Continue oral diet would recommend low-fat diet due to pancreatitis and due to hypertriglyceridemia  Okay to advance as tolerated  IV fluids at 200 to 250 cc an hour for the first 48 hours, and titrate to avoid fluid overload.  These can be decreased as patient tolerates more oral liquids and diet  Triglycerides above 500 can be associated with pancreatitis, but usually are seen to be above 1000 when causing pancreatitis.  Albeit, he does not have any more hyperglycemia and triglycerides have decreased to less than 500 at this time, therefore IV insulin not indicated  Patient's outpatient medication list does include fenofibrate.  Would continue this at this time as an inpatient due to his hypertriglyceridemia.  Patient will need close follow-up with PCP as an outpatient to monitor triglycerides and change medication appropriately to avoid hypertriglyceridemia  Abstinence encouraged CIWA protocol Folate, thiamine  No high risk features present at this time.  Lactate is normal.  No hypocalcemia.  Normal white count.  Vital signs stable.  Follow-up in GI clinic in 4 to 6 weeks after discharge  GI service will sign off at this time.  Please page with any questions or concerns  Thank you for involving me in the care of this patient.      LOS: 1 day   Pasty Spillers, MD  03/01/2021, 4:43 PM

## 2021-03-02 ENCOUNTER — Other Ambulatory Visit: Payer: Self-pay

## 2021-03-02 LAB — BASIC METABOLIC PANEL
Anion gap: 7 (ref 5–15)
BUN: 6 mg/dL (ref 6–20)
CO2: 25 mmol/L (ref 22–32)
Calcium: 9.3 mg/dL (ref 8.9–10.3)
Chloride: 101 mmol/L (ref 98–111)
Creatinine, Ser: 0.55 mg/dL — ABNORMAL LOW (ref 0.61–1.24)
GFR, Estimated: >60 mL/min (ref >60)
Glucose, Bld: 266 mg/dL — ABNORMAL HIGH (ref 70–99)
Potassium: 4 mmol/L (ref 3.5–5.1)
Sodium: 133 mmol/L — ABNORMAL LOW (ref 135–145)

## 2021-03-02 LAB — CBC
HCT: 34.4 % — ABNORMAL LOW (ref 39.0–52.0)
Hemoglobin: 11.8 g/dL — ABNORMAL LOW (ref 13.0–17.0)
MCH: 33.2 pg (ref 26.0–34.0)
MCHC: 34.3 g/dL (ref 30.0–36.0)
MCV: 96.9 fL (ref 80.0–100.0)
Platelets: 123 10*3/uL — ABNORMAL LOW (ref 150–400)
RBC: 3.55 MIL/uL — ABNORMAL LOW (ref 4.22–5.81)
RDW: 11.5 % (ref 11.5–15.5)
WBC: 4.6 10*3/uL (ref 4.0–10.5)
nRBC: 0 % (ref 0.0–0.2)

## 2021-03-02 LAB — GLUCOSE, CAPILLARY
Glucose-Capillary: 235 mg/dL — ABNORMAL HIGH (ref 70–99)
Glucose-Capillary: 259 mg/dL — ABNORMAL HIGH (ref 70–99)
Glucose-Capillary: 279 mg/dL — ABNORMAL HIGH (ref 70–99)
Glucose-Capillary: 338 mg/dL — ABNORMAL HIGH (ref 70–99)

## 2021-03-02 LAB — MAGNESIUM: Magnesium: 2.1 mg/dL (ref 1.7–2.4)

## 2021-03-02 MED ORDER — ATORVASTATIN CALCIUM 40 MG PO TABS
40.0000 mg | ORAL_TABLET | Freq: Every day | ORAL | 0 refills | Status: DC
  Filled 2021-03-02: qty 30, 30d supply, fill #0

## 2021-03-02 MED ORDER — INSULIN LISPRO PROT & LISPRO (75-25 MIX) 100 UNIT/ML KWIKPEN
30.0000 [IU] | PEN_INJECTOR | Freq: Two times a day (BID) | SUBCUTANEOUS | 0 refills | Status: DC
  Filled 2021-03-02: qty 30, 50d supply, fill #0

## 2021-03-02 MED ORDER — FENOFIBRATE 145 MG PO TABS
145.0000 mg | ORAL_TABLET | Freq: Every day | ORAL | 0 refills | Status: DC
  Filled 2021-03-02: qty 30, 30d supply, fill #0

## 2021-03-02 MED ORDER — INSULIN PEN NEEDLE 32G X 4 MM MISC
1.0000 | Freq: Two times a day (BID) | 2 refills | Status: AC
  Filled 2021-03-02: qty 100, 50d supply, fill #0

## 2021-03-02 MED ORDER — METFORMIN HCL ER 500 MG PO TB24
500.0000 mg | ORAL_TABLET | Freq: Two times a day (BID) | ORAL | 0 refills | Status: DC
  Filled 2021-03-02: qty 60, 30d supply, fill #0

## 2021-03-02 NOTE — TOC Initial Note (Signed)
Transition of Care [facility]) - Initial/Assessment Note    Patient Details  Name: Cody Cowan MRN: [id number] Date of Birth: Aug 27, 1965  [facility]) CM/SW Contact:    Chapman Fitch, RN Phone Number: 03/02/2021, 10:05 AM  Clinical Narrative:                  Checked with Medication Management , medications are not ready for pick up yet        Patient Goals and CMS Choice        Expected Discharge Plan and Services           Expected Discharge Date: 03/02/21                                    Prior Living Arrangements/Services                       Activities of Daily Living Home Assistive Devices/Equipment: None ADL Screening (condition at time of admission) Patient's cognitive ability adequate to safely complete daily activities?: Yes Is the patient deaf or have difficulty hearing?: No Does the patient have difficulty seeing, even when wearing glasses/contacts?: No Does the patient have difficulty concentrating, remembering, or making decisions?: No Patient able to express need for assistance with ADLs?: Yes Does the patient have difficulty dressing or bathing?: No Independently performs ADLs?: Yes (appropriate for developmental age) Does the patient have difficulty walking or climbing stairs?: No Weakness of Legs: None Weakness of Arms/Hands: None  Permission Sought/Granted                  Emotional Assessment              Admission diagnosis:  Acute pancreatitis [K85.90] Alcohol-induced acute pancreatitis, unspecified complication status [K85.20] Patient Active Problem List   Diagnosis Date Noted   Acute pancreatitis 02/28/2021   Protein-calorie malnutrition, severe 01/30/2021   Diabetic foot (HCC) 01/29/2021   Hyperglycemia due to diabetes mellitus (HCC) 01/27/2021   Alcohol abuse with intoxication (HCC) 01/12/2020   Hyperglycemia 01/12/2020   Hyponatremia 01/12/2020   Pancreatitis, acute  01/12/2020   PCP:  Patient, No Pcp Per (Inactive) Pharmacy:   [facility] [address]), [place] - [address] [place]) [place] [address] Phone: [phone] Fax: [phone]  [facility] [address] [place] [address] Phone: [phone] Fax: [phone]     Social Determinants of Health (SDOH) Interventions    Readmission Risk Interventions No flowsheet data found.

## 2021-03-02 NOTE — Discharge Summary (Signed)
Physician Discharge Summary   Urban [employer]  male DOB: Jan 31, 1966  GGY:[id number]  PCP: Patient, No Pcp Per (Inactive)  Admit date: 02/28/2021 Discharge date: 03/02/2021  Admitted From: home Disposition:  home CODE STATUS: Full code   Hospital Course:  For full details, please see H&P, progress notes, consult notes and ancillary notes.  Briefly,  Cody Cowan is a 55 y.o. male with medical history significant of alcohol abuse, pancreatitis, hypertriglyceridemia DM2 who presented with abdominal pain.    # Acute pancreatitis, presumed alcoholic --pt had prior hx of pancreatitis in Nov 2021, thought to be due to alcoholic use, however, triglyceride was high during that time as well, >1900's --Lipase 571 on current presentation with triglyceride 871.  US abdomen and MRCP ruled out acute cholecystitis and choledocholithiasis. --Pt received MIVF with improvement in abdominal pain.  Diet was advanced to low-fat diet prior to discharge.   # Hypertriglyceridemia  --triglyceride 871 on current presentation.  Triglyceride >1900's during last hospitalization for pancreatitis about 1 year ago.  Unclear whether hypertriglyceridemia was contributory to pt's recurrent pancreatitis, so GI was consulted, who recommended continuing on home fenofibrate. --Follow-up in GI clinic in 4 to 6 weeks after discharge   # DM2, poorly controlled # Hypoglycemia  --A1c 10.2 about 1 month ago.  Reported taking 70/30 Novolog 25u BID at home, but A1c still elevated. --had rapid call on 12/26 for severe hypoglycemic event, with BG 12.  Had only received 1 dose of Novolog 20u, but had been on clear liquid diet.  No further hypoglycemic episodes after insulin held and diet advanced. --Pt was discharged on home diabetic regimen and advised to follow up with PCP for further adjustment.   --Pt was instructed again on how to get set up with [facility].  # Alcohol abuse  --pt reported  last drink about 2 weeks ago --cessation advised.   Discharge Diagnoses:  Principal Problem:   Acute pancreatitis   30 Day Unplanned Readmission Risk Score    Flowsheet Row ED to Hosp-Admission (Current) from 02/28/2021 in [facility]  30 Day Unplanned Readmission Risk Score (%) 11.63 Filed at 03/02/2021 0801       This score is the patient's risk of an unplanned readmission within 30 days of being discharged (0 -100%). The score is based on dignosis, age, lab data, medications, orders, and past utilization.   Low:  0-14.9   Medium: 15-21.9   High: 22-29.9   Extreme: 30 and above         Discharge Instructions:  Allergies as of 03/02/2021   No Known Allergies      Medication List     TAKE these medications    atorvastatin 40 MG tablet Commonly known as: Lipitor Take 1 tablet (40 mg total) by mouth daily.   fenofibrate 145 MG tablet Commonly known as: TRICOR Take 1 tablet (145 mg total) by mouth daily.   Insulin Lispro Prot & Lispro (75-25) 100 UNIT/ML Kwikpen Commonly known as: HUMALOG 75/25 MIX Inyecte 30 unidades en la piel 2 veces al da antes del almuerzo y Physicist, medical. (Inject 30 Units into the skin 2 (two) times daily before lunch and supper.)   Insulin Pen Needle 32G X 4 MM Misc 1 each by Does not apply route 2 (two) times daily.   metFORMIN 500 MG 24 hr tablet Commonly known as: GLUCOPHAGE-XR Tome 1 tableta por va oral 2 veces al da con una comida (Take 1 tablet (  500 mg total) by mouth 2 (two) times daily with a meal.)   multivitamin with minerals Tabs tablet Take 1 tablet by mouth daily.         Follow-up Information     Pasty Spillers, MD Follow up in 4 week(s).   Specialty: Gastroenterology Contact information: [address] [place] [address] [phone]                 No Known Allergies   The results of significant diagnostics from this hospitalization (including  imaging, microbiology, ancillary and laboratory) are listed below for reference.   Consultations:   Procedures/Studies: CT ABDOMEN PELVIS W CONTRAST  Result Date: 02/28/2021 CLINICAL DATA:  Abdominal pain EXAM: CT ABDOMEN AND PELVIS WITH CONTRAST TECHNIQUE: Multidetector CT imaging of the abdomen and pelvis was performed using the standard protocol following bolus administration of intravenous contrast. CONTRAST:  80mL OMNIPAQUE IOHEXOL 300 MG/ML  SOLN COMPARISON:  01/12/2020 FINDINGS: Lower chest: No acute abnormality. Hepatobiliary: No solid liver abnormality is seen. Hepatic steatosis. No gallstones, gallbladder wall thickening, or biliary dilatation. Pancreas: Diffuse peripancreatic fat stranding and fluid about the pancreas and adjacent retroperitoneum. No discrete fluid collection. No parenchymal hypoenhancement. No pancreatic ductal dilatation. Spleen: Normal in size without significant abnormality. Adrenals/Urinary Tract: Adrenal glands are unremarkable. Kidneys are normal, without renal calculi, solid lesion, or hydronephrosis. Bladder is unremarkable. Stomach/Bowel: Stomach is within normal limits. Appendix appears normal. No evidence of bowel wall thickening, distention, or inflammatory changes. Vascular/Lymphatic: Aortic atherosclerosis. Enlarged portacaval lymph node measuring up to 1.9 x 1.2 cm (series 2, image 31). Reproductive: No mass or other significant abnormality. Other: No abdominal wall hernia or abnormality. No abdominopelvic ascites. Musculoskeletal: No acute or significant osseous findings. IMPRESSION: 1. Diffuse peripancreatic fat stranding and fluid about the pancreas and adjacent retroperitoneum, consistent with acute pancreatitis. No discrete fluid collection or evidence of pancreatic necrosis. 2. Enlarged portacaval lymph node, likely reactive. 3. Hepatic steatosis. Aortic Atherosclerosis (ICD10-I70.0). Electronically Signed   By: Jearld Lesch M.D.   On: 02/28/2021 11:56    MR ABDOMEN MRCP WO CONTRAST  Result Date: 02/28/2021 CLINICAL DATA:  Pancreatitis on CT scan earlier today. EXAM: MRI ABDOMEN WITHOUT CONTRAST  (INCLUDING MRCP) TECHNIQUE: Multiplanar multisequence MR imaging of the abdomen was performed. Heavily T2-weighted images of the biliary and pancreatic ducts were obtained, and three-dimensional MRCP images were rendered by post processing. COMPARISON:  CT scan and ultrasound exam earlier same day. FINDINGS: Lower chest: Unremarkable. Hepatobiliary: No focal abnormality in the liver on this study without intravenous contrast. Gallbladder is distended with potential but not definite 2 mm stone towards the fundus (coronal T2 haste image 11 of series 5). No intrahepatic or extrahepatic biliary duct dilatation. Extrahepatic common duct measures 4 mm diameter. Common bile duct in the head of the pancreas appears attenuated (see coronal 3D MRCP image 34 of series 14 and coronal thin MRCP image 10 of series 16), possibly from edema in the head of the pancreas. Pancreas: As seen on CT, there is diffuse edema in the pancreas and in the peripancreatic fat. No main duct dilatation. Spleen:  No splenomegaly. No focal mass lesion. Adrenals/Urinary Tract: No right adrenal nodule or mass. 13 mm left adrenal nodule shows loss of signal intensity on out of phase T1 imaging compatible with adenoma. Kidneys unremarkable. Stomach/Bowel: Stomach is nondistended. Peri-duodenal edema evident. No small bowel or colonic dilatation within the visualized abdomen. Vascular/Lymphatic: No abdominal aortic aneurysm. No abdominal lymphadenopathy. Other: Small volume free fluid noted  around the liver with extensive edema in the retroperitoneal space of the abdomen and tracking into the hepatoduodenal ligament and region of gallbladder fossa. Musculoskeletal: No suspicious marrow signal abnormality. IMPRESSION: 1. Possible but not definite tiny 2 mm gallstone. No evidence for choledocholithiasis. No  intra or extrahepatic biliary duct dilatation. 2. Edema in and around the pancreas consistent with acute pancreatitis. There is some edema in the wall of the descending duodenum and in the Peri duodenal fat. Duodenitis/peptic ulcer disease a possibility but considered less likely by imaging. 3. Attenuation of the common bile duct in the head of the pancreas, potentially secondary the pancreatic edema. No intrahepatic or extrahepatic biliary duct dilatation. No evidence for choledocholithiasis. 4. 13 mm left adrenal adenoma. Electronically Signed   By: Kennith Center M.D.   On: 02/28/2021 15:24   US ABDOMEN LIMITED RUQ (LIVER/GB)  Result Date: 02/28/2021 CLINICAL DATA:  elevated lfts, upper abd pain EXAM: ULTRASOUND ABDOMEN LIMITED RIGHT UPPER QUADRANT COMPARISON:  January 28, 2021; same day CT FINDINGS: Gallbladder: No gallstones or wall thickening visualized. No sonographic Murphy sign noted by sonographer. Common bile duct: Diameter: 4 mm, normal Liver: No focal lesion identified. Diffusely increased hepatic parenchymal echogenicity. Portal vein is patent on color Doppler imaging with normal direction of blood flow towards the liver. Other: None. IMPRESSION: Hepatic steatosis. Electronically Signed   By: Meda Klinefelter M.D.   On: 02/28/2021 13:26      Labs: BNP (last 3 results) No results for input(s): BNP in the last 8760 hours. Basic Metabolic Panel: Recent Labs  Lab 02/28/21 0958 03/01/21 1300 03/01/21 1918 03/02/21 0556  NA 126* 129* 132* 133*  K 3.6 3.8 2.8* 4.0  CL 93* 100 102 101  CO2 22 24 25 25   GLUCOSE 451* 148* 122* 266*  BUN 14 6 <5* 6  CREATININE 0.80 0.40* 0.40* 0.55*  CALCIUM 9.3 8.9 8.9 9.3  MG  --  1.9  --  2.1   Liver Function Tests: Recent Labs  Lab 02/28/21 0958 03/01/21 1300  AST 61* 37  ALT 69* 41  ALKPHOS 217* 115  BILITOT 2.5* 1.5*  PROT 7.6 6.7  ALBUMIN 3.9 3.3*   Recent Labs  Lab 02/28/21 0958 03/01/21 1300  LIPASE 571* 83*   No results  for input(s): AMMONIA in the last 168 hours. CBC: Recent Labs  Lab 02/28/21 0958 03/01/21 1300 03/02/21 0556  WBC 7.0 5.8 4.6  HGB 13.9 11.9* 11.8*  HCT 39.7 34.2* 34.4*  MCV 97.3 98.0 96.9  PLT 135* 119* 123*   Cardiac Enzymes: No results for input(s): CKTOTAL, CKMB, CKMBINDEX, TROPONINI in the last 168 hours. BNP: Invalid input(s): POCBNP CBG: Recent Labs  Lab 03/01/21 1909 03/01/21 2147 03/02/21 0015 03/02/21 0220 03/02/21 0820  GLUCAP 153* 256* 259* 235* 279*   D-Dimer No results for input(s): DDIMER in the last 72 hours. Hgb A1c No results for input(s): HGBA1C in the last 72 hours. Lipid Profile Recent Labs    02/28/21 0958 03/01/21 1300  TRIG 871* 365*   Thyroid function studies No results for input(s): TSH, T4TOTAL, T3FREE, THYROIDAB in the last 72 hours.  Invalid input(s): FREET3 Anemia work up No results for input(s): VITAMINB12, FOLATE, FERRITIN, TIBC, IRON, RETICCTPCT in the last 72 hours. Urinalysis    Component Value Date/Time   COLORURINE YELLOW 02/28/2021 1114   APPEARANCEUR CLEAR 02/28/2021 1114   LABSPEC 1.010 02/28/2021 1114   PHURINE 5.5 02/28/2021 1114   GLUCOSEU >1,000 (A) 02/28/2021 1114   HGBUR NEGATIVE  02/28/2021 1114   BILIRUBINUR NEGATIVE 02/28/2021 1114   KETONESUR 40 (A) 02/28/2021 1114   PROTEINUR NEGATIVE 02/28/2021 1114   NITRITE NEGATIVE 02/28/2021 1114   LEUKOCYTESUR NEGATIVE 02/28/2021 1114   Sepsis Labs Invalid input(s): PROCALCITONIN,  WBC,  LACTICIDVEN Microbiology Recent Results (from the past 240 hour(s))  Resp Panel by RT-PCR (Flu A&B, Covid) Nasopharyngeal Swab     Status: None   Collection Time: 02/28/21 11:14 AM   Specimen: Nasopharyngeal Swab; Nasopharyngeal(NP) swabs in vial transport medium  Result Value Ref Range Status   SARS Coronavirus 2 by RT PCR NEGATIVE NEGATIVE Final    Comment: (NOTE) SARS-CoV-2 target nucleic acids are NOT DETECTED.  The SARS-CoV-2 RNA is generally detectable in upper  respiratory specimens during the acute phase of infection. The lowest concentration of SARS-CoV-2 viral copies this assay can detect is 138 copies/mL. A negative result does not preclude SARS-Cov-2 infection and should not be used as the sole basis for treatment or other patient management decisions. A negative result may occur with  improper specimen collection/handling, submission of specimen other than nasopharyngeal swab, presence of viral mutation(s) within the areas targeted by this assay, and inadequate number of viral copies(<138 copies/mL). A negative result must be combined with clinical observations, patient history, and epidemiological information. The expected result is Negative.  Fact Sheet for Patients:  BloggerCourse.com  Fact Sheet for Healthcare Providers:  SeriousBroker.it  This test is no t yet approved or cleared by the [place] FDA and  has been authorized for detection and/or diagnosis of SARS-CoV-2 by FDA under an Emergency Use Authorization (EUA). This EUA will remain  in effect (meaning this test can be used) for the duration of the COVID-19 declaration under Section 564(b)(1) of the Act, 21 U.S.C.section 360bbb-3(b)(1), unless the authorization is terminated  or revoked sooner.       Influenza A by PCR NEGATIVE NEGATIVE Final   Influenza B by PCR NEGATIVE NEGATIVE Final    Comment: (NOTE) The Xpert Xpress SARS-CoV-2/FLU/RSV plus assay is intended as an aid in the diagnosis of influenza from Nasopharyngeal swab specimens and should not be used as a sole basis for treatment. Nasal washings and aspirates are unacceptable for Xpert Xpress SARS-CoV-2/FLU/RSV testing.  Fact Sheet for Patients: BloggerCourse.com  Fact Sheet for Healthcare Providers: SeriousBroker.it  This test is not yet approved or cleared by the [place] FDA and has been  authorized for detection and/or diagnosis of SARS-CoV-2 by FDA under an Emergency Use Authorization (EUA). This EUA will remain in effect (meaning this test can be used) for the duration of the COVID-19 declaration under Section 564(b)(1) of the Act, 21 U.S.C. section 360bbb-3(b)(1), unless the authorization is terminated or revoked.  Performed at [facility], [address] [place], [place] [address]      Total time spend on discharging this patient, including the last patient exam, discussing the hospital stay, instructions for ongoing care as it relates to all pertinent caregivers, as well as preparing the medical discharge records, prescriptions, and/or referrals as applicable, is 45 minutes.    Darlin Priestly, MD  [facility] 03/02/2021, 9:55 AM

## 2021-03-02 NOTE — TOC Initial Note (Signed)
Transition of Care [facility]) - Initial/Assessment Note    Patient Details  Name: Cody Cowan MRN: [id number] Date of Birth: 1965/12/08  Transition of Care [facility]) CM/SW Contact:    Chapman Fitch, RN Phone Number: 03/02/2021, 10:56 AM  Clinical Narrative:                 Assessment completed with Spanish Ipad interpreter  Patient admitted with pancreatitis  Patient states that he lives at home alone Denies issues with transportation, sister will pick up at discharge  Patient states that he called [facility]  but was unable to reach anyone that could speak spanish.  Patient provided spanish [facility]  and [facility]  application.  He is to call [facility]  back when they open on 1/3.     Discharge prescriptions will be filled today at [facility]  and will be delivered to room after lunch.  Patient confirms he has a glucometer and supplies at home.  Declines substance abuse resources.  MD and bedside RN updated  Expected Discharge Plan: Home/Self Care Barriers to Discharge: Barriers Resolved   Patient Goals and CMS Choice        Expected Discharge Plan and Services Expected Discharge Plan: Home/Self Care         Expected Discharge Date: 03/02/21                                    Prior Living Arrangements/Services   Lives with:: Self Patient language and need for interpreter reviewed:: Yes Do you feel safe going back to the place where you live?: Yes      Need for Family Participation in Patient Care: Yes (Comment) Care giver support system in place?: Yes (comment)   Criminal Activity/Legal Involvement Pertinent to Current Situation/Hospitalization: No - Comment as needed  Activities of Daily Living Home Assistive Devices/Equipment: None ADL Screening (condition at time of admission) Patient's cognitive ability adequate to safely complete daily activities?: Yes Is the patient deaf or have difficulty  hearing?: No Does the patient have difficulty seeing, even when wearing glasses/contacts?: No Does the patient have difficulty concentrating, remembering, or making decisions?: No Patient able to express need for assistance with ADLs?: Yes Does the patient have difficulty dressing or bathing?: No Independently performs ADLs?: Yes (appropriate for developmental age) Does the patient have difficulty walking or climbing stairs?: No Weakness of Legs: None Weakness of Arms/Hands: None  Permission Sought/Granted                  Emotional Assessment Appearance:: Appears stated age     Orientation: : Oriented to Self, Oriented to Place, Oriented to  Time, Oriented to Situation Alcohol / Substance Use: Not Applicable Psych Involvement: No (comment)  Admission diagnosis:  Acute pancreatitis [K85.90] Alcohol-induced acute pancreatitis, unspecified complication status [K85.20] Patient Active Problem List   Diagnosis Date Noted   Acute pancreatitis 02/28/2021   Protein-calorie malnutrition, severe 01/30/2021   Diabetic foot (HCC) 01/29/2021   Hyperglycemia due to diabetes mellitus (HCC) 01/27/2021   Alcohol abuse with intoxication (HCC) 01/12/2020   Hyperglycemia 01/12/2020   Hyponatremia 01/12/2020   Pancreatitis, acute 01/12/2020   PCP:  Patient, No Pcp Per (Inactive) Pharmacy:   [facility] [address] [place] [address] [place]) [place] [address] Phone: [phone] Fax: [phone]  [facility]  Pharmacy [address], [address] [place] [address] Phone: [phone] Fax: [phone]     Social Determinants of Health (SDOH) Interventions    Readmission Risk Interventions No flowsheet data found.

## 2021-03-09 LAB — GLUCOSE, CAPILLARY: Glucose-Capillary: <10 mg/dL — CL (ref 70–99)

## 2021-03-18 ENCOUNTER — Telehealth: Payer: Self-pay

## 2021-03-18 NOTE — Telephone Encounter (Signed)
-----   Message from Pasty Spillers, MD sent at 03/03/2021 11:56 PM EST ----- Shawna Orleans Please make clinic follow up for pancreatitis

## 2021-03-18 NOTE — Telephone Encounter (Signed)
Patient was contacted and a hospital appointment was made. Spanish interpreter will be requested.

## 2021-04-05 ENCOUNTER — Other Ambulatory Visit: Payer: Self-pay

## 2021-04-05 ENCOUNTER — Ambulatory Visit (INDEPENDENT_AMBULATORY_CARE_PROVIDER_SITE_OTHER): Payer: Self-pay | Admitting: Gastroenterology

## 2021-04-05 ENCOUNTER — Encounter: Payer: Self-pay | Admitting: Gastroenterology

## 2021-04-05 VITALS — BP 112/63 | HR 114 | Temp 97.3°F | Wt 128.8 lb

## 2021-04-05 DIAGNOSIS — Z1211 Encounter for screening for malignant neoplasm of colon: Secondary | ICD-10-CM

## 2021-04-05 DIAGNOSIS — K859 Acute pancreatitis without necrosis or infection, unspecified: Secondary | ICD-10-CM

## 2021-04-05 NOTE — Progress Notes (Signed)
Wyline Mood MD, MRCP(U.K) [address]  [address]  [place], [place] [address]  Main: [phone]  Fax: [phone]   Primary Care Physician: Patient, No Pcp Per (Inactive)  Primary Gastroenterologist:  Dr. Wyline Mood   Chief Complaint  Patient presents with   Pancreatitis    HPI: Cody Cowan is a 56 y.o. male   Summary of history :  Patient was seen by Dr. Maximino Greenland on 03/01/2021 when the patient presented with abdominal pain of 3 days duration and was diagnosed with pancreatitis.  He has a history of daily alcohol use.  Prior episode in November 2020 similarly secondary to alcohol.  On admission LFTs were normal and MRCP did not show any choledocholithiasis.  Right upper quadrant ultrasound showed hepatic steatosis.  Triglycerides were elevated at 871.  Treated conservatively and discharged home.  Advised to continue his fibrate's.  Interval history   02/23/2021-04/05/2021  Since discharge he has not consumed any alcohol.  Denies any abdominal pain.  No new symptoms.  He has never had a colonoscopy.  No lower GI symptoms.    Current Outpatient Medications  Medication Sig Dispense Refill   atorvastatin (LIPITOR) 40 MG tablet Take 1 tablet (40 mg total) by mouth daily. 90 tablet 0   fenofibrate (TRICOR) 145 MG tablet Take 1 tablet (145 mg total) by mouth daily. 90 tablet 0   Insulin Lispro Prot & Lispro (HUMALOG 75/25 MIX) (75-25) 100 UNIT/ML Kwikpen Inject 30 Units into the skin 2 (two) times daily before lunch and supper. 60 mL 0   Insulin Pen Needle 32G X 4 MM MISC Use with insulin pen 2 times daily 100 each 2   metFORMIN (GLUCOPHAGE-XR) 500 MG 24 hr tablet Take 1 tablet (500 mg total) by mouth 2 (two) times daily with a meal. 180 tablet 0   Multiple Vitamin (MULTIVITAMIN WITH MINERALS) TABS tablet Take 1 tablet by mouth daily. 30 tablet 0   No current facility-administered medications for this visit.    Allergies as of 04/05/2021   (No Known  Allergies)    ROS:  General: Negative for anorexia, weight loss, fever, chills, fatigue, weakness. ENT: Negative for hoarseness, difficulty swallowing , nasal congestion. CV: Negative for chest pain, angina, palpitations, dyspnea on exertion, peripheral edema.  Respiratory: Negative for dyspnea at rest, dyspnea on exertion, cough, sputum, wheezing.  GI: See history of present illness. GU:  Negative for dysuria, hematuria, urinary incontinence, urinary frequency, nocturnal urination.  Endo: Negative for unusual weight change.    Physical Examination:   BP 112/63    Pulse (!) 114    Temp (!) 97.3 F (36.3 C) (Oral)    Wt 128 lb 12.8 oz (58.4 kg)    BMI 20.79 kg/m   General: Well-nourished, well-developed in no acute distress.  Eyes: No icterus. Conjunctivae pink. Mouth: Oropharyngeal mucosa moist and pink , no lesions erythema or exudate. Lungs: Clear to auscultation bilaterally. Non-labored. Heart: Regular rate and rhythm, no murmurs rubs or gallops.  Abdomen: Bowel sounds are normal, nontender, nondistended, no hepatosplenomegaly or masses, no abdominal bruits or hernia , no rebound or guarding.   Extremities: No lower extremity edema. No clubbing or deformities. Neuro: Alert and oriented x 3.  Grossly intact. Skin: 2 bullous lesions over his left fingers on the dorsal aspect, nontender, no erythema.  Appear to be fluid-filled. Psych: Alert and cooperative, normal mood and affect.   Imaging Studies: No results found.  Assessment and Plan:   Cody Cowan  is a 56 y.o. y/o male is here today to see me as a follow-up after hospitalization for alcohol induced pancreatitis back in December 2020.  He has had 2 prior episodes of the same.   Plan  Stop alcohol use  Management of hyperlipidemia: Hypertriglyceridemia with primary care provider as he is also diabetic. Colon cancer screening: Discussed the advantages of a colonoscopy.  He would like to proceed if it is  covered.  He does not have insurance.  Provided patient assistance forms.  Once it is filled up we will schedule him for the same. Bullous lesions over the left digits.  Nontender.  Isolated.  Advised him to watch over the next 24 to 48 hours if not getting better will need to seek medical advice of his primary care provider.  Does not appear infected.  I have discussed alternative options, risks & benefits,  which include, but are not limited to, bleeding, infection, perforation,respiratory complication & drug reaction.  The patient agrees with this plan & written consent will be obtained.     Dr Jonathon Bellows  MD,MRCP [facility]) Follow up in as needed

## 2021-04-12 ENCOUNTER — Other Ambulatory Visit: Payer: Self-pay

## 2021-05-07 ENCOUNTER — Encounter (INDEPENDENT_AMBULATORY_CARE_PROVIDER_SITE_OTHER): Payer: Self-pay | Admitting: Vascular Surgery

## 2021-05-19 ENCOUNTER — Other Ambulatory Visit: Payer: Self-pay

## 2021-05-28 ENCOUNTER — Encounter (INDEPENDENT_AMBULATORY_CARE_PROVIDER_SITE_OTHER): Payer: Self-pay | Admitting: Vascular Surgery

## 2021-08-01 ENCOUNTER — Emergency Department: Payer: Self-pay

## 2021-08-01 ENCOUNTER — Inpatient Hospital Stay
Admission: EM | Admit: 2021-08-01 | Discharge: 2021-08-09 | DRG: 256 | Disposition: A | Discharge status: Home / Self Care | Payer: Self-pay | Attending: Internal Medicine | Admitting: Internal Medicine

## 2021-08-01 DIAGNOSIS — M869 Osteomyelitis, unspecified: Secondary | ICD-10-CM | POA: Diagnosis present

## 2021-08-01 DIAGNOSIS — Z7984 Long term (current) use of oral hypoglycemic drugs: Secondary | ICD-10-CM

## 2021-08-01 DIAGNOSIS — B952 Enterococcus as the cause of diseases classified elsewhere: Secondary | ICD-10-CM | POA: Diagnosis present

## 2021-08-01 DIAGNOSIS — M86172 Other acute osteomyelitis, left ankle and foot: Secondary | ICD-10-CM | POA: Diagnosis present

## 2021-08-01 DIAGNOSIS — E1169 Type 2 diabetes mellitus with other specified complication: Secondary | ICD-10-CM | POA: Diagnosis present

## 2021-08-01 DIAGNOSIS — Z91141 Patient's other noncompliance with medication regimen due to financial hardship: Secondary | ICD-10-CM

## 2021-08-01 DIAGNOSIS — B9620 Unspecified Escherichia coli [E. coli] as the cause of diseases classified elsewhere: Secondary | ICD-10-CM | POA: Diagnosis present

## 2021-08-01 DIAGNOSIS — I739 Peripheral vascular disease, unspecified: Secondary | ICD-10-CM

## 2021-08-01 DIAGNOSIS — E1142 Type 2 diabetes mellitus with diabetic polyneuropathy: Secondary | ICD-10-CM | POA: Diagnosis present

## 2021-08-01 DIAGNOSIS — Z794 Long term (current) use of insulin: Secondary | ICD-10-CM

## 2021-08-01 DIAGNOSIS — Z23 Encounter for immunization: Secondary | ICD-10-CM

## 2021-08-01 DIAGNOSIS — E11621 Type 2 diabetes mellitus with foot ulcer: Secondary | ICD-10-CM | POA: Diagnosis present

## 2021-08-01 DIAGNOSIS — L97529 Non-pressure chronic ulcer of other part of left foot with unspecified severity: Secondary | ICD-10-CM | POA: Diagnosis present

## 2021-08-01 DIAGNOSIS — L03116 Cellulitis of left lower limb: Secondary | ICD-10-CM | POA: Diagnosis present

## 2021-08-01 DIAGNOSIS — E781 Pure hyperglyceridemia: Secondary | ICD-10-CM | POA: Diagnosis present

## 2021-08-01 DIAGNOSIS — Z79899 Other long term (current) drug therapy: Secondary | ICD-10-CM

## 2021-08-01 DIAGNOSIS — E1152 Type 2 diabetes mellitus with diabetic peripheral angiopathy with gangrene: Principal | ICD-10-CM | POA: Diagnosis present

## 2021-08-01 DIAGNOSIS — E11628 Type 2 diabetes mellitus with other skin complications: Secondary | ICD-10-CM | POA: Diagnosis present

## 2021-08-01 DIAGNOSIS — E871 Hypo-osmolality and hyponatremia: Secondary | ICD-10-CM | POA: Diagnosis present

## 2021-08-01 DIAGNOSIS — B961 Klebsiella pneumoniae [K. pneumoniae] as the cause of diseases classified elsewhere: Secondary | ICD-10-CM | POA: Diagnosis present

## 2021-08-01 DIAGNOSIS — F1020 Alcohol dependence, uncomplicated: Secondary | ICD-10-CM

## 2021-08-01 DIAGNOSIS — M79605 Pain in left leg: Secondary | ICD-10-CM

## 2021-08-01 DIAGNOSIS — E1165 Type 2 diabetes mellitus with hyperglycemia: Secondary | ICD-10-CM | POA: Diagnosis present

## 2021-08-01 LAB — CBC WITH DIFFERENTIAL/PLATELET
Abs Immature Granulocytes: 0.05 10*3/uL (ref 0.00–0.07)
Basophils Absolute: 0 10*3/uL (ref 0.0–0.1)
Basophils Relative: 0 %
Eosinophils Absolute: 0.1 10*3/uL (ref 0.0–0.5)
Eosinophils Relative: 1 %
HCT: 38.3 % — ABNORMAL LOW (ref 39.0–52.0)
Hemoglobin: 12.5 g/dL — ABNORMAL LOW (ref 13.0–17.0)
Immature Granulocytes: 1 %
Lymphocytes Relative: 13 %
Lymphs Abs: 1.1 10*3/uL (ref 0.7–4.0)
MCH: 30.5 pg (ref 26.0–34.0)
MCHC: 32.6 g/dL (ref 30.0–36.0)
MCV: 93.4 fL (ref 80.0–100.0)
Monocytes Absolute: 0.6 10*3/uL (ref 0.1–1.0)
Monocytes Relative: 7 %
Neutro Abs: 6.5 10*3/uL (ref 1.7–7.7)
Neutrophils Relative %: 78 %
Platelets: 324 10*3/uL (ref 150–400)
RBC: 4.1 MIL/uL — ABNORMAL LOW (ref 4.22–5.81)
RDW: 11.6 % (ref 11.5–15.5)
WBC: 8.3 10*3/uL (ref 4.0–10.5)
nRBC: 0 % (ref 0.0–0.2)

## 2021-08-01 LAB — URINALYSIS, ROUTINE W REFLEX MICROSCOPIC
Bacteria, UA: NONE SEEN
Bilirubin Urine: NEGATIVE
Glucose, UA: >=500 mg/dL — AB
Hgb urine dipstick: NEGATIVE
Ketones, ur: NEGATIVE mg/dL
Leukocytes,Ua: NEGATIVE
Nitrite: NEGATIVE
Protein, ur: NEGATIVE mg/dL
Specific Gravity, Urine: 1.026 (ref 1.005–1.030)
Squamous Epithelial / HPF: NONE SEEN (ref 0–5)
WBC, UA: NONE SEEN WBC/hpf (ref 0–5)
pH: 7 (ref 5.0–8.0)

## 2021-08-01 LAB — COMPREHENSIVE METABOLIC PANEL
ALT: 22 U/L (ref 0–44)
ALT: 19 U/L (ref 0–44)
AST: 33 U/L (ref 15–41)
AST: 28 U/L (ref 15–41)
Albumin: 3.6 g/dL (ref 3.5–5.0)
Albumin: 3.1 g/dL — ABNORMAL LOW (ref 3.5–5.0)
Alkaline Phosphatase: 168 U/L — ABNORMAL HIGH (ref 38–126)
Alkaline Phosphatase: 170 U/L — ABNORMAL HIGH (ref 38–126)
Anion gap: 10 (ref 5–15)
Anion gap: 5 (ref 5–15)
BUN: 17 mg/dL (ref 6–20)
BUN: 12 mg/dL (ref 6–20)
CO2: 27 mmol/L (ref 22–32)
CO2: 27 mmol/L (ref 22–32)
Calcium: 9 mg/dL (ref 8.9–10.3)
Calcium: 8.1 mg/dL — ABNORMAL LOW (ref 8.9–10.3)
Chloride: 91 mmol/L — ABNORMAL LOW (ref 98–111)
Chloride: 101 mmol/L (ref 98–111)
Creatinine, Ser: 0.71 mg/dL (ref 0.61–1.24)
Creatinine, Ser: 0.53 mg/dL — ABNORMAL LOW (ref 0.61–1.24)
GFR, Estimated: >60 mL/min (ref >60)
GFR, Estimated: >60 mL/min (ref >60)
Glucose, Bld: 725 mg/dL (ref 70–99)
Glucose, Bld: 338 mg/dL — ABNORMAL HIGH (ref 70–99)
Potassium: 4.6 mmol/L (ref 3.5–5.1)
Potassium: 3.9 mmol/L (ref 3.5–5.1)
Sodium: 128 mmol/L — ABNORMAL LOW (ref 135–145)
Sodium: 133 mmol/L — ABNORMAL LOW (ref 135–145)
Total Bilirubin: 0.9 mg/dL (ref 0.3–1.2)
Total Bilirubin: 0.8 mg/dL (ref 0.3–1.2)
Total Protein: 7.8 g/dL (ref 6.5–8.1)
Total Protein: 6.6 g/dL (ref 6.5–8.1)

## 2021-08-01 LAB — GASTROINTESTINAL PANEL BY PCR, STOOL (REPLACES STOOL CULTURE)

## 2021-08-01 LAB — C DIFFICILE QUICK SCREEN W PCR REFLEX
C Diff antigen: NEGATIVE
C Diff interpretation: NOT DETECTED
C Diff toxin: NEGATIVE

## 2021-08-01 LAB — HEMOGLOBIN A1C
Hgb A1c MFr Bld: 14.8 % — ABNORMAL HIGH (ref 4.8–5.6)
Mean Plasma Glucose: 378.06 mg/dL

## 2021-08-01 LAB — ETHANOL: Alcohol, Ethyl (B): <10 mg/dL (ref <10)

## 2021-08-01 LAB — C-REACTIVE PROTEIN: CRP: 6.5 mg/dL — ABNORMAL HIGH (ref <1.0)

## 2021-08-01 LAB — LACTIC ACID, PLASMA: Lactic Acid, Venous: 1.9 mmol/L (ref 0.5–1.9)

## 2021-08-01 LAB — SEDIMENTATION RATE: Sed Rate: 61 mm/hr — ABNORMAL HIGH (ref 0–20)

## 2021-08-01 LAB — TSH: TSH: 11.55 u[IU]/mL — ABNORMAL HIGH (ref 0.350–4.500)

## 2021-08-01 LAB — CBG MONITORING, ED
Glucose-Capillary: 462 mg/dL — ABNORMAL HIGH (ref 70–99)
Glucose-Capillary: 325 mg/dL — ABNORMAL HIGH (ref 70–99)
Glucose-Capillary: 361 mg/dL — ABNORMAL HIGH (ref 70–99)

## 2021-08-01 IMAGING — DX DG FOOT COMPLETE 3+V*L*
3 series · 3 of 3 positions shown · non-contrast
Comparison: [DATE]

CLINICAL DATA: Fourth toe ulceration, oozing, osteomyelitis

EXAM:
LEFT FOOT - COMPLETE 3+ VIEW

[foot ap]
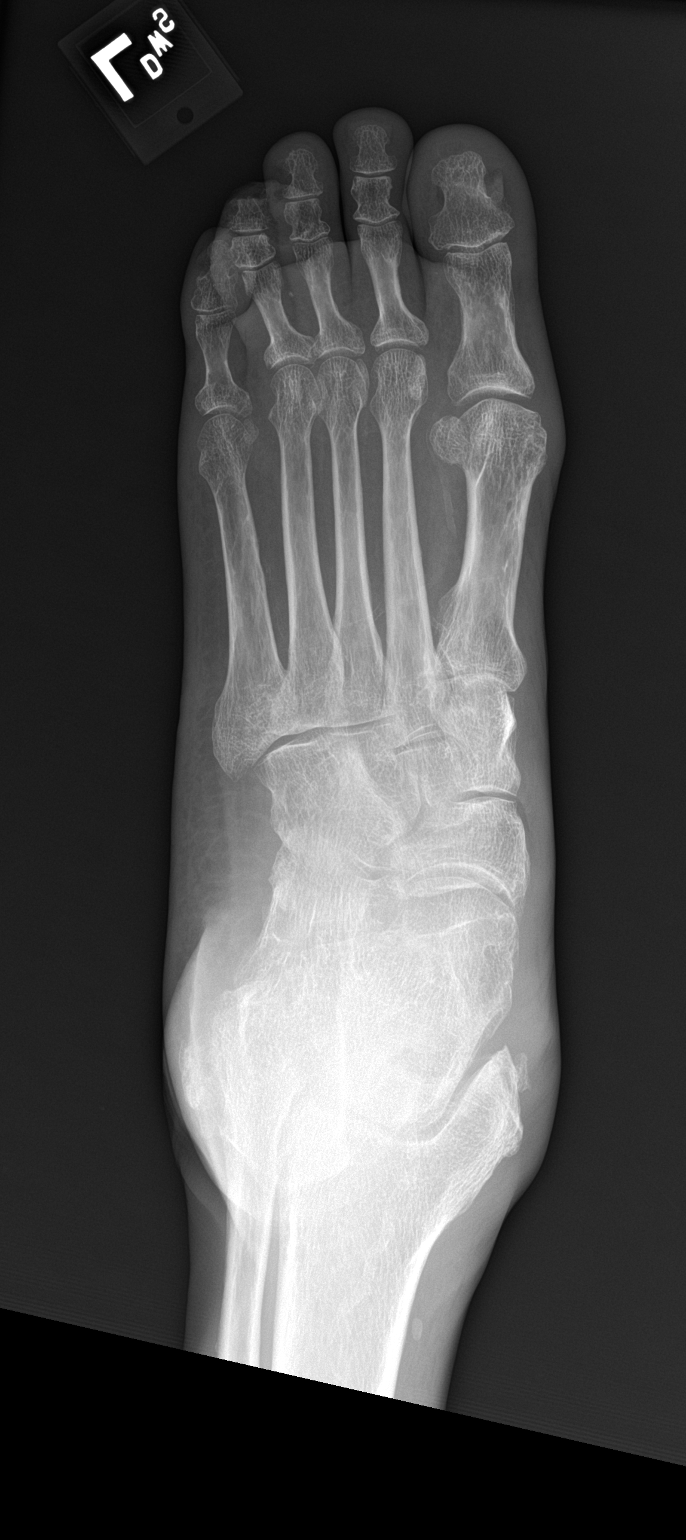

[foot obl]
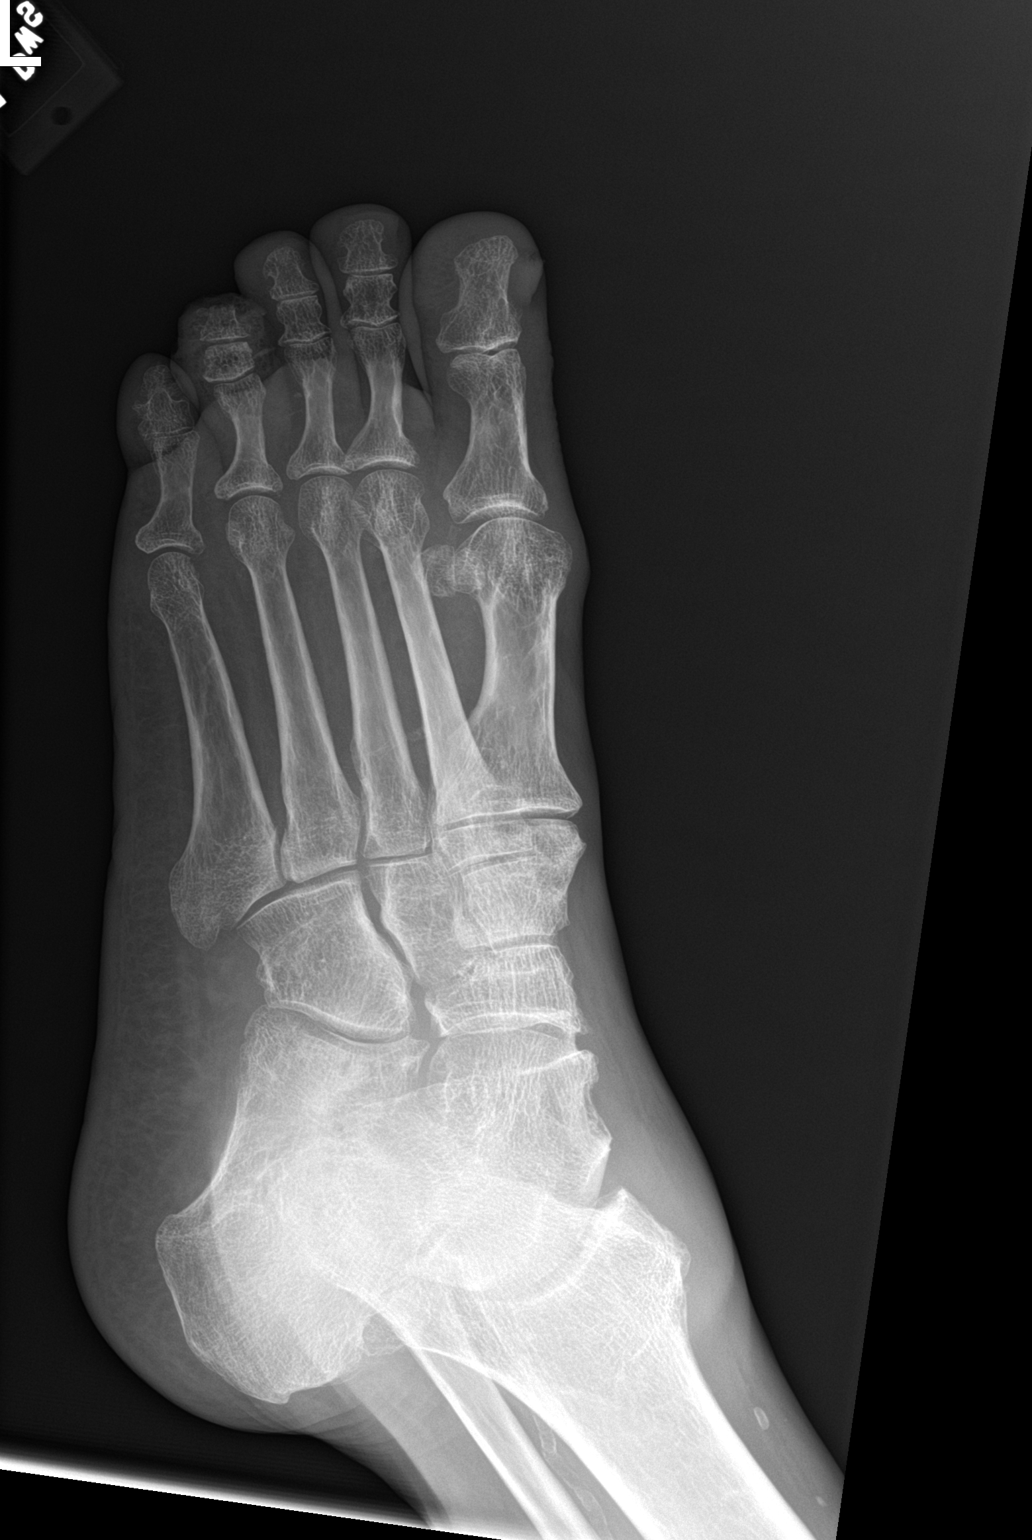

[foot lat]
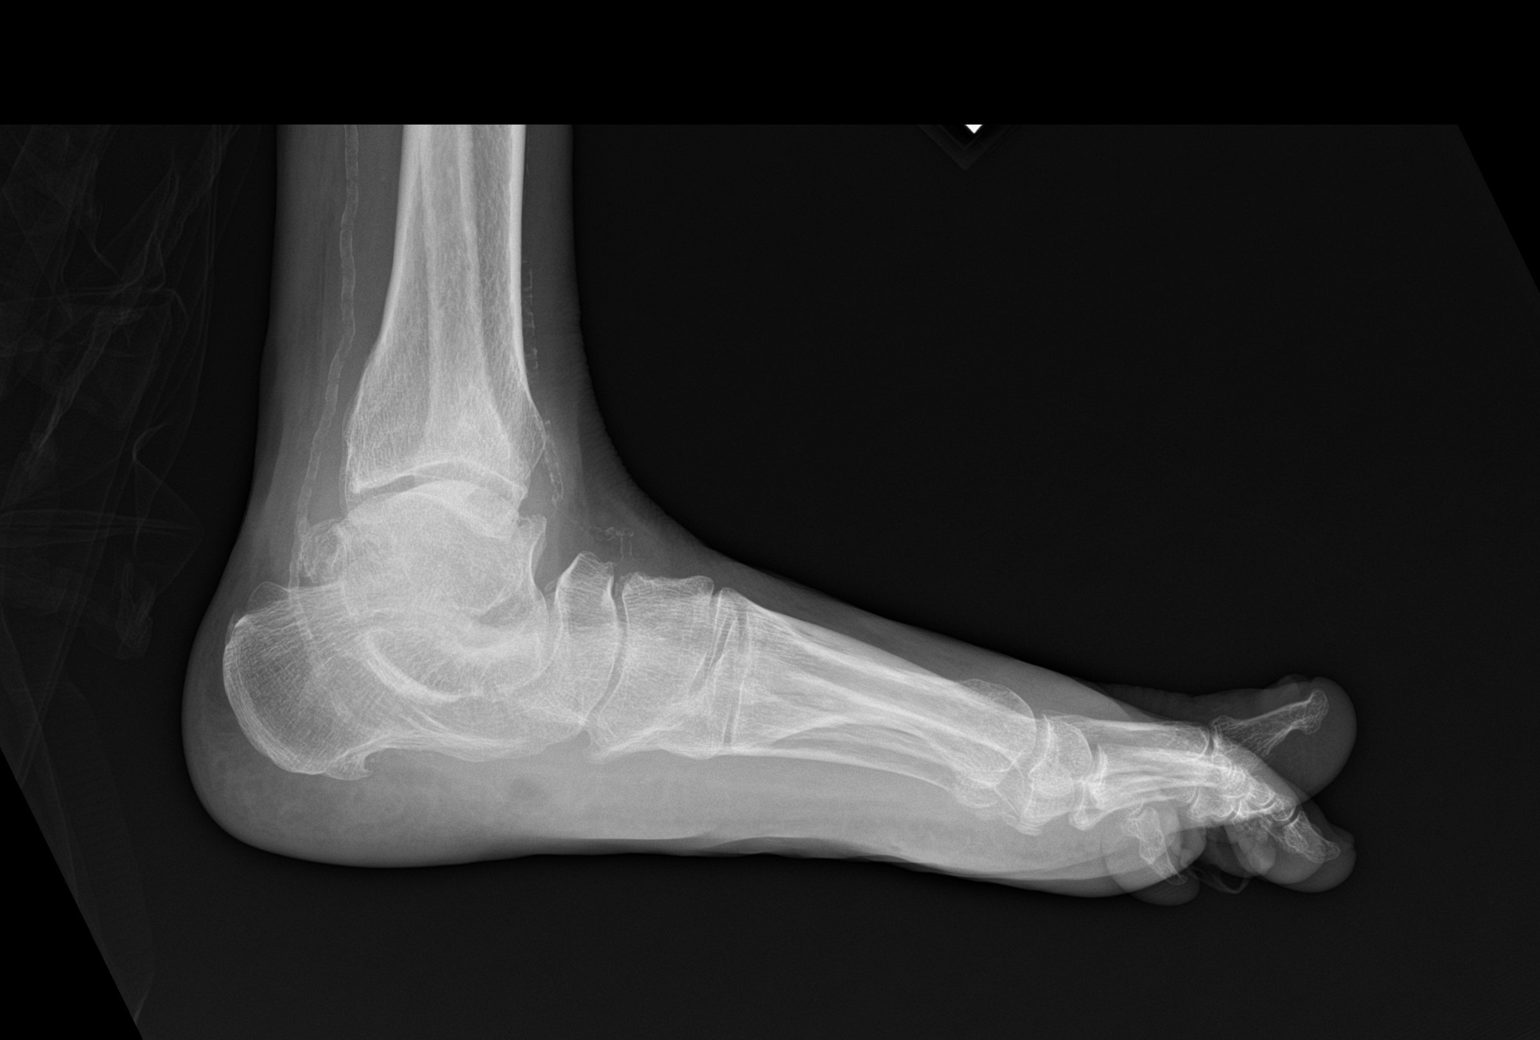

[3 of 3 positions shown; findings below may reference images not displayed]

FINDINGS: No fracture or dislocation of the left foot. Ulceration of the tip
of the left fourth digit with bony erosion of the tuft of the left
fourth distal phalanx. Flatfoot deformity. Vascular calcinosis.
IMPRESSION: 1. Ulceration of the tip of the left fourth digit with bony erosion
of the tuft of the left fourth distal phalanx, concerning for
osteomyelitis.
2. Flatfoot deformity.

## 2021-08-01 IMAGING — DX DG ANKLE COMPLETE 3+V*L*
3 series · 3 of 3 positions shown · non-contrast
Comparison: MR [DATE].

CLINICAL DATA: Evaluate for osteomyelitis.

EXAM:
LEFT ANKLE COMPLETE - 3+ VIEW

[ankle ap]
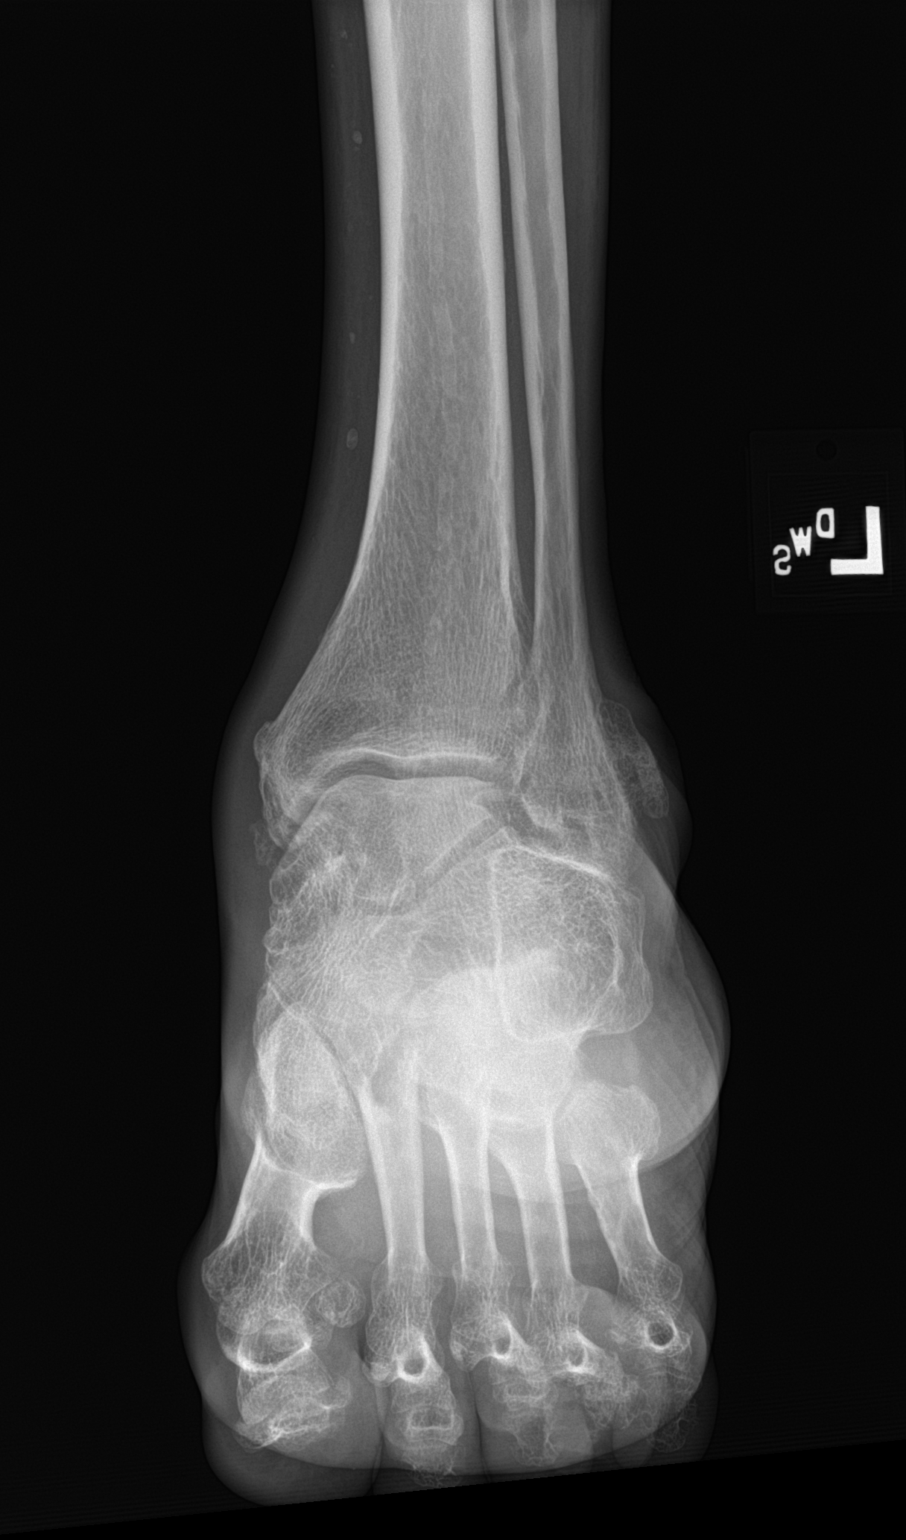

[ankle obl]
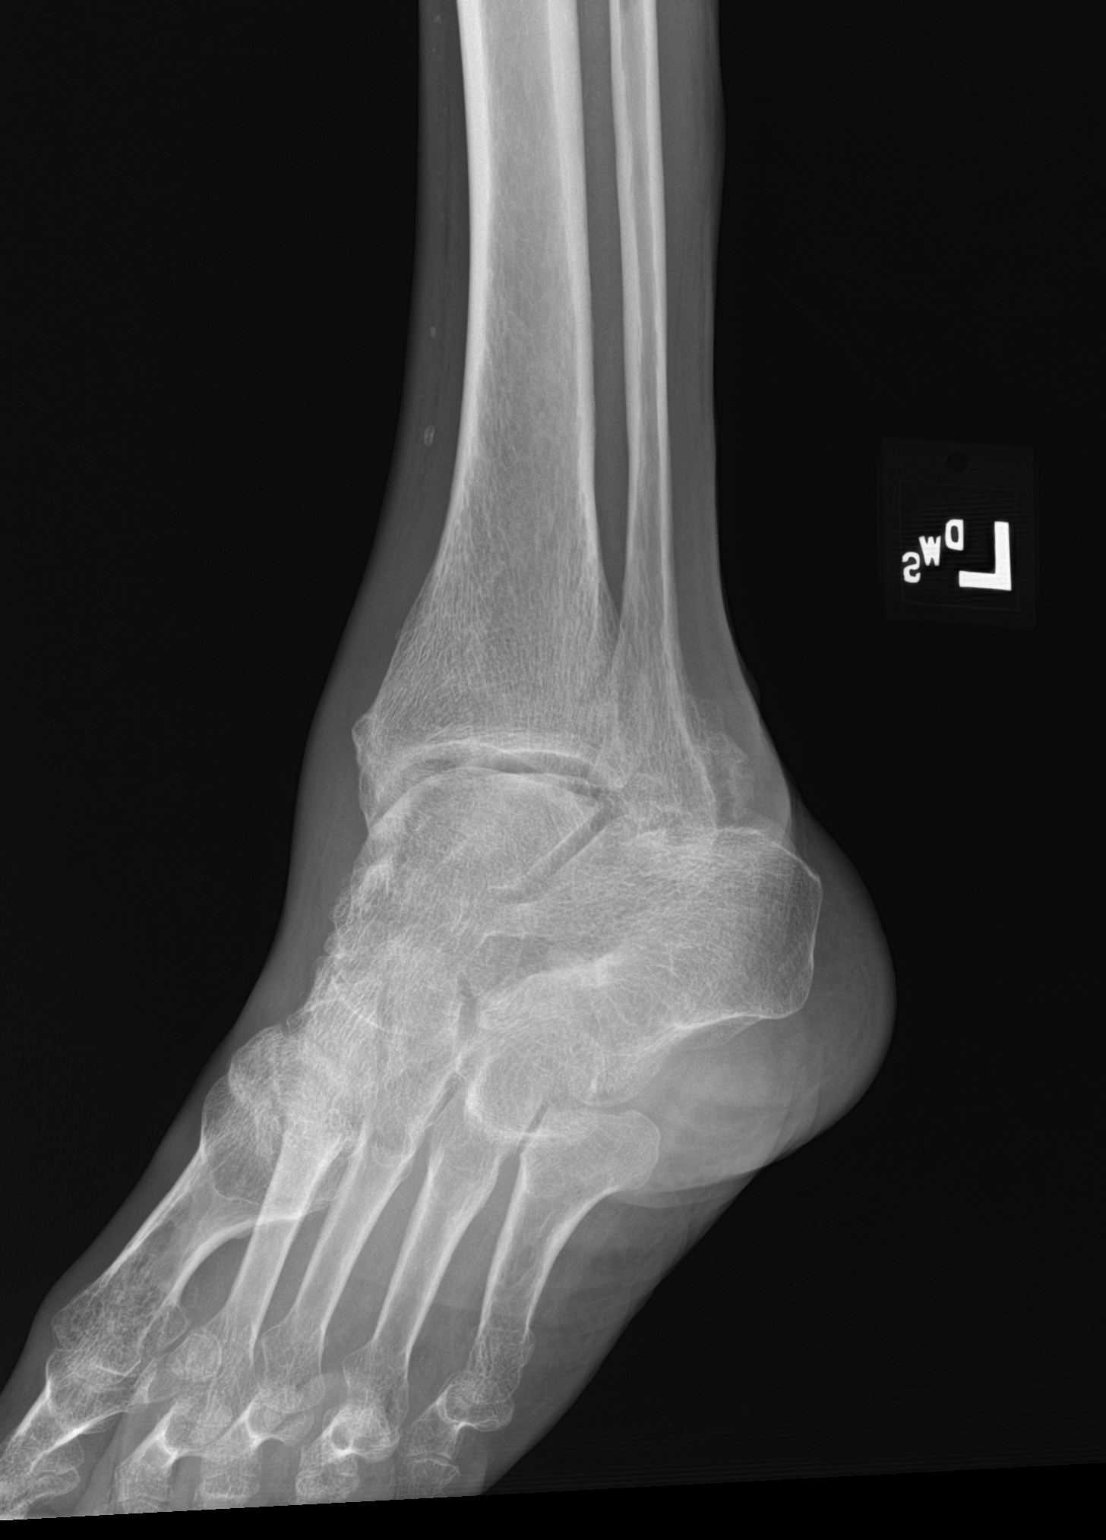

[ankle lat]
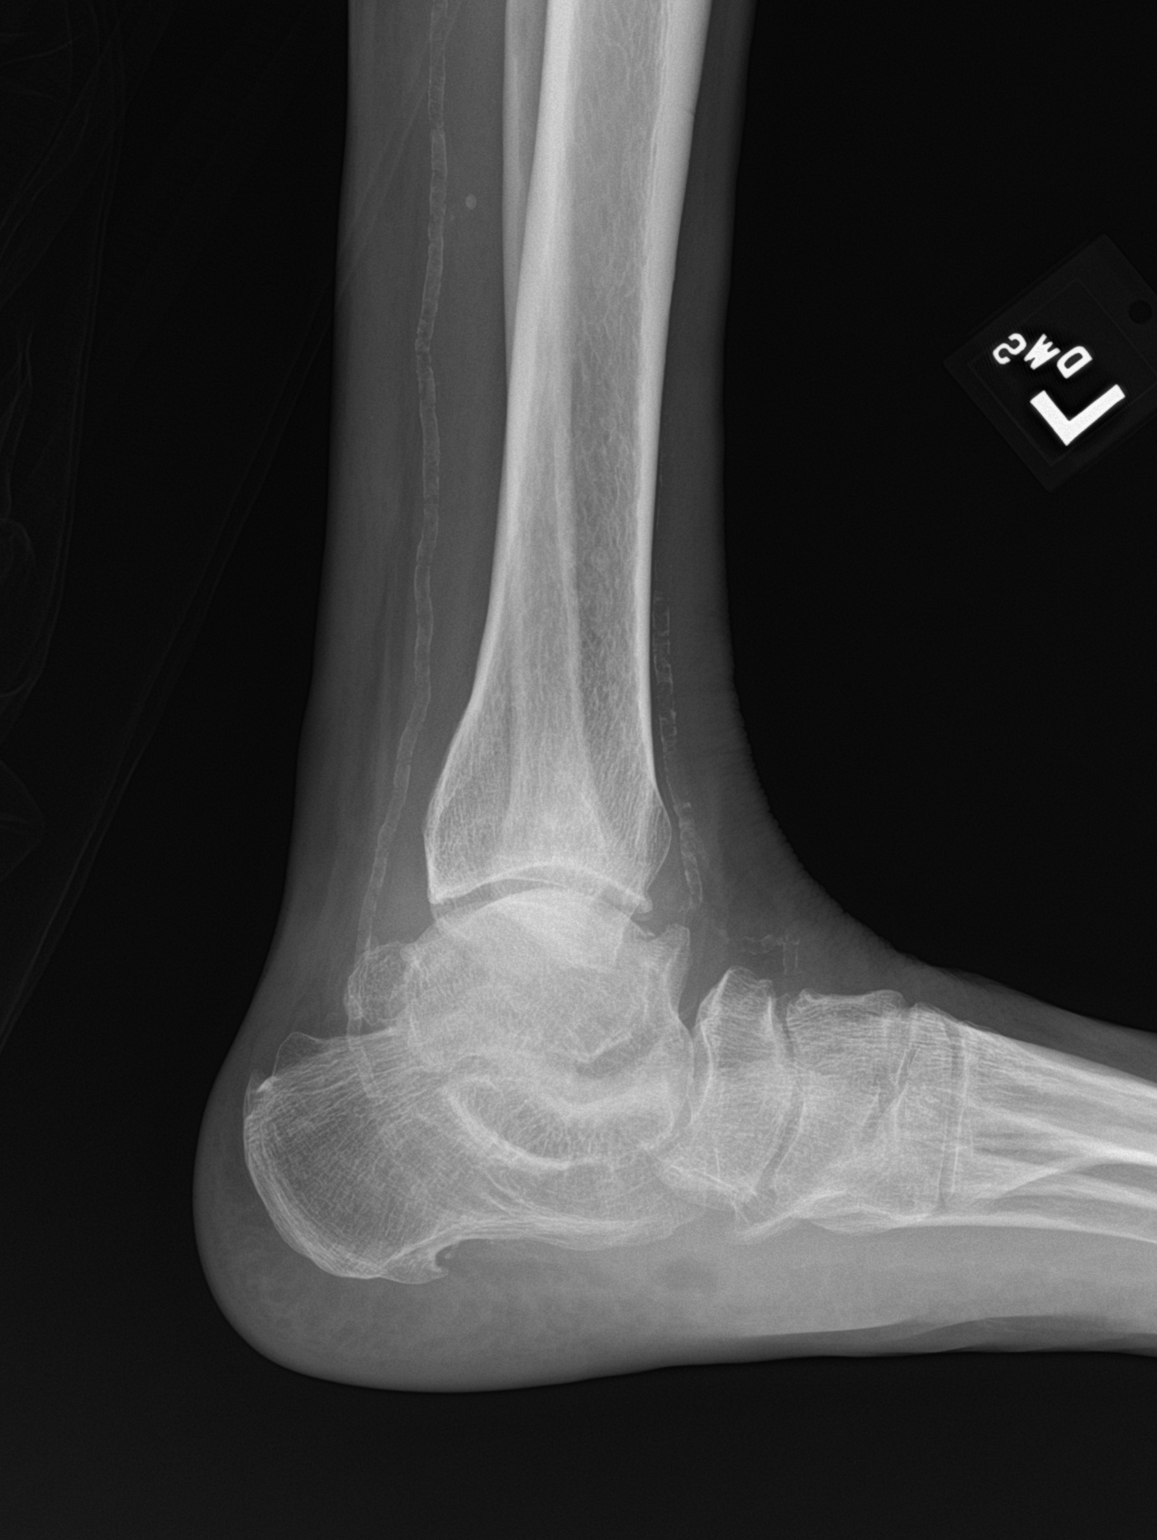

[3 of 3 positions shown; findings below may reference images not displayed]

FINDINGS: Mild diffuse soft tissue swelling. Extensive vascular calcifications
are identified. No signs of acute fracture or dislocation. No
suspicious bone erosions identified.
IMPRESSION: 1. Soft tissue swelling .
2. No radiographic evidence for osteomyelitis.
3. Extensive vascular calcifications.

## 2021-08-01 MED ORDER — ALBUTEROL SULFATE (2.5 MG/3ML) 0.083% IN NEBU: 2.5000 mg | INHALATION_SOLUTION | RESPIRATORY_TRACT | Status: DC | PRN

## 2021-08-01 MED ORDER — SODIUM CHLORIDE 0.9 % IV BOLUS
1000.0000 mL | Freq: Once | INTRAVENOUS | Status: AC
  Administered 2021-08-01: 1000 mL via INTRAVENOUS

## 2021-08-01 MED ORDER — ONDANSETRON HCL 4 MG PO TABS: 4.0000 mg | ORAL_TABLET | Freq: Four times a day (QID) | ORAL | Status: DC | PRN

## 2021-08-01 MED ORDER — ENOXAPARIN SODIUM 40 MG/0.4ML IJ SOSY
40.0000 mg | PREFILLED_SYRINGE | INTRAMUSCULAR | Status: DC
  Administered 2021-08-01 – 2021-08-08 (×8): 40 mg via SUBCUTANEOUS
  Filled 2021-08-01 (×8): qty 0.4

## 2021-08-01 MED ORDER — SODIUM CHLORIDE 0.9% FLUSH
3.0000 mL | Freq: Two times a day (BID) | INTRAVENOUS | Status: DC
  Administered 2021-08-01 – 2021-08-09 (×14): 3 mL via INTRAVENOUS

## 2021-08-01 MED ORDER — MORPHINE SULFATE (PF) 2 MG/ML IV SOLN: 2.0000 mg | INTRAVENOUS | Status: DC | PRN

## 2021-08-01 MED ORDER — INSULIN ASPART 100 UNIT/ML IJ SOLN
5.0000 [IU] | Freq: Once | INTRAMUSCULAR | Status: AC
  Administered 2021-08-01: 5 [IU] via INTRAVENOUS
  Filled 2021-08-01: qty 1

## 2021-08-01 MED ORDER — SODIUM CHLORIDE 0.9 % IV SOLN: INTRAVENOUS | Status: DC

## 2021-08-01 MED ORDER — ACETAMINOPHEN 325 MG PO TABS: 650.0000 mg | ORAL_TABLET | Freq: Four times a day (QID) | ORAL | Status: DC | PRN

## 2021-08-01 MED ORDER — DEXTROSE IN LACTATED RINGERS 5 % IV SOLN: INTRAVENOUS | Status: DC

## 2021-08-01 MED ORDER — INSULIN GLARGINE-YFGN 100 UNIT/ML ~~LOC~~ SOLN
30.0000 [IU] | Freq: Every day | SUBCUTANEOUS | Status: DC
  Administered 2021-08-01 – 2021-08-03 (×3): 30 [IU] via SUBCUTANEOUS
  Filled 2021-08-01 (×4): qty 0.3

## 2021-08-01 MED ORDER — ATORVASTATIN CALCIUM 20 MG PO TABS
40.0000 mg | ORAL_TABLET | Freq: Every day | ORAL | Status: DC
Start: 2021-08-01 — End: 2021-08-01
  Filled 2021-08-01: qty 2

## 2021-08-01 MED ORDER — HYDROMORPHONE HCL 1 MG/ML IJ SOLN
1.0000 mg | Freq: Once | INTRAMUSCULAR | Status: AC
  Administered 2021-08-01: 1 mg via INTRAVENOUS
  Filled 2021-08-01: qty 1

## 2021-08-01 MED ORDER — TETANUS-DIPHTH-ACELL PERTUSSIS 5-2.5-18.5 LF-MCG/0.5 IM SUSY
0.5000 mL | PREFILLED_SYRINGE | Freq: Once | INTRAMUSCULAR | Status: AC
  Administered 2021-08-01: 0.5 mL via INTRAMUSCULAR
  Filled 2021-08-01: qty 0.5

## 2021-08-01 MED ORDER — ACETAMINOPHEN 650 MG RE SUPP
650.0000 mg | Freq: Four times a day (QID) | RECTAL | Status: DC | PRN
Start: 2021-08-01 — End: 2021-08-09

## 2021-08-01 MED ORDER — ONDANSETRON HCL 4 MG/2ML IJ SOLN
4.0000 mg | Freq: Four times a day (QID) | INTRAMUSCULAR | Status: DC | PRN
Start: 2021-08-01 — End: 2021-08-09

## 2021-08-01 MED ORDER — HYDROCODONE-ACETAMINOPHEN 5-325 MG PO TABS
1.0000 | ORAL_TABLET | ORAL | Status: DC | PRN
  Administered 2021-08-03 – 2021-08-05 (×5): 1 via ORAL
  Filled 2021-08-01 (×5): qty 1

## 2021-08-01 MED ORDER — SODIUM CHLORIDE 0.9 % IV SOLN: INTRAVENOUS | Status: AC

## 2021-08-01 MED ORDER — PIPERACILLIN-TAZOBACTAM 3.375 G IVPB 30 MIN
3.3750 g | Freq: Once | INTRAVENOUS | Status: AC
  Administered 2021-08-01: 3.375 g via INTRAVENOUS
  Filled 2021-08-01: qty 50

## 2021-08-01 MED ORDER — SODIUM CHLORIDE 0.9 % IV BOLUS (SEPSIS)
250.0000 mL | Freq: Once | INTRAVENOUS | Status: AC
  Administered 2021-08-01: 250 mL via INTRAVENOUS

## 2021-08-01 MED ORDER — DEXTROSE 50 % IV SOLN: 0.0000 mL | INTRAVENOUS | Status: DC | PRN

## 2021-08-01 MED ORDER — LACTATED RINGERS IV SOLN: INTRAVENOUS | Status: DC

## 2021-08-01 MED ORDER — INSULIN ASPART 100 UNIT/ML IJ SOLN
0.0000 [IU] | Freq: Three times a day (TID) | INTRAMUSCULAR | Status: DC
  Administered 2021-08-02: 5 [IU] via SUBCUTANEOUS
  Administered 2021-08-02 (×2): 11 [IU] via SUBCUTANEOUS
  Administered 2021-08-03: 3 [IU] via SUBCUTANEOUS
  Administered 2021-08-03: 8 [IU] via SUBCUTANEOUS
  Administered 2021-08-04 (×2): 11 [IU] via SUBCUTANEOUS
  Administered 2021-08-05 (×2): 8 [IU] via SUBCUTANEOUS
  Administered 2021-08-05: 15 [IU] via SUBCUTANEOUS
  Administered 2021-08-06: 11 [IU] via SUBCUTANEOUS
  Administered 2021-08-07: 5 [IU] via SUBCUTANEOUS
  Administered 2021-08-07: 15 [IU] via SUBCUTANEOUS
  Administered 2021-08-08: 5 [IU] via SUBCUTANEOUS
  Administered 2021-08-08: 15 [IU] via SUBCUTANEOUS
  Administered 2021-08-08: 11 [IU] via SUBCUTANEOUS
  Administered 2021-08-09: 8 [IU] via SUBCUTANEOUS
  Administered 2021-08-09: 2 [IU] via SUBCUTANEOUS
  Filled 2021-08-01 (×19): qty 1

## 2021-08-01 MED ORDER — FENOFIBRATE 160 MG PO TABS: 160.0000 mg | ORAL_TABLET | Freq: Every day | ORAL | Status: DC

## 2021-08-01 MED ORDER — SODIUM CHLORIDE 0.9 % IV BOLUS (SEPSIS)
500.0000 mL | Freq: Once | INTRAVENOUS | Status: AC
  Administered 2021-08-01: 500 mL via INTRAVENOUS

## 2021-08-01 MED ORDER — INSULIN ASPART 100 UNIT/ML IJ SOLN
3.0000 [IU] | Freq: Three times a day (TID) | INTRAMUSCULAR | Status: DC
  Administered 2021-08-02 – 2021-08-04 (×6): 3 [IU] via SUBCUTANEOUS
  Filled 2021-08-01 (×6): qty 1

## 2021-08-01 MED ORDER — VANCOMYCIN HCL IN DEXTROSE 1-5 GM/200ML-% IV SOLN
1000.0000 mg | Freq: Once | INTRAVENOUS | Status: AC
  Administered 2021-08-01: 1000 mg via INTRAVENOUS
  Filled 2021-08-01: qty 200

## 2021-08-01 MED ORDER — INSULIN REGULAR(HUMAN) IN NACL 100-0.9 UT/100ML-% IV SOLN
INTRAVENOUS | Status: DC
  Filled 2021-08-01: qty 100

## 2021-08-01 MED ORDER — SODIUM CHLORIDE 0.9 % IV BOLUS (SEPSIS)
1000.0000 mL | Freq: Once | INTRAVENOUS | Status: AC
  Administered 2021-08-01: 1000 mL via INTRAVENOUS

## 2021-08-01 NOTE — Progress Notes (Signed)
[facility] following for sepsis protocol. 

## 2021-08-01 NOTE — ED Notes (Signed)
Pt eating

## 2021-08-01 NOTE — ED Notes (Signed)
Dr Maisie Fus was at bedside, cbg checked. LR infusion is for after NS infusion complete and IV insulin will be changed to subcut.

## 2021-08-01 NOTE — ED Notes (Signed)
PA and ED MD at bedside examining pt foot again.

## 2021-08-01 NOTE — H&P (Signed)
History and Physical    Cody Cowan ZOX:[id number] DOB: 1965-11-30 DOA: 08/01/2021  PCP: Patient, No Pcp Per (Inactive)  Patient coming from: home  I have personally briefly reviewed patient's old medical records in [facility]  Chief Complaint: foot infection   HPI: Cody Cowan is a 56 y.o. male with medical  history significant for alcohol abuse, pancreatitis, DM2 who presents to ED with complaint of 4th toe infection. He notes that toe has increase redness and drainage as well as black discoloration. He notes no fever/chills/ n/v/d/abdominal pain associated with infection. He also endorses chronic ulcer on left foot x 6 months but noted no drainage or changes recently but has intermittent pain. He also endorses stepping on a nail and notes puncture wound above his chronic ulcer. He notes no drainage from this area.  He is mainly concerned about the left 4th toe that has increase redness swelling , drainage and black discoloration. Due to this he presents to ED for evaluation.    ED Course:  IN ED: vitals: afeb, bp 129/91, hr 117, rr 18 , sat 96% on ra  Labs: Wbc8.3, hgb12.5 (11.8), plt 324, lactic 1.9 Gluc 725, re-peat  425 Na:128, K4.6, cr 0.71 UA: negative EKG: sinus tachycardia  t wave inversion I, II,AVL, some change  from prior   Xray IMPRESSION: 1. Ulceration of the tip of the left fourth digit with bony erosion of the tuft of the left fourth distal phalanx, concerning for osteomyelitis. 2. Flatfoot deformity. Tx :dilaudid  1mg , ns 1L, zosyn/vanc  Review of Systems: As per HPI otherwise 10 point review of systems negative.   Past Medical History:  Diagnosis Date   Diabetes (HCC)     History reviewed. No pertinent surgical history.   reports that he has never smoked. He has never used smokeless tobacco. He reports that he does not currently use alcohol. No history on file for drug use.  No Known Allergies  History reviewed. No  pertinent family history.  Prior to Admission medications   Medication Sig Start Date End Date Taking? Authorizing Provider  atorvastatin (LIPITOR) 40 MG tablet Take 1 tablet (40 mg total) by mouth daily. 03/02/21 05/31/21  06/02/21, MD  fenofibrate (TRICOR) 145 MG tablet Take 1 tablet (145 mg total) by mouth daily. 03/02/21 05/31/21  06/02/21, MD  Insulin Lispro Prot & Lispro (HUMALOG 75/25 MIX) (75-25) 100 UNIT/ML Kwikpen Inject 30 Units into the skin 2 (two) times daily before lunch and supper. 03/02/21 05/31/21  06/02/21, MD  metFORMIN (GLUCOPHAGE-XR) 500 MG 24 hr tablet Take 1 tablet (500 mg total) by mouth 2 (two) times daily with a meal. 03/02/21 05/31/21  06/02/21, MD  Multiple Vitamin (MULTIVITAMIN WITH MINERALS) TABS tablet Take 1 tablet by mouth daily. 01/17/20   Pahwani, 13/12/21, MD  insulin isophane & regular human KwikPen (NOVOLIN 70/30 KWIKPEN) (70-30) 100 UNIT/ML KwikPen Inject 30 Units into the skin 2 (two) times daily before lunch and supper. 02/01/21 02/01/21  02/03/21, MD    Physical Exam: Vitals:   08/01/21 1530 08/01/21 1545 08/01/21 1600 08/01/21 1645  BP: (!) 151/86  (!) 162/99   Pulse: (!) 114 (!) 110 (!) 109 (!) 106  Resp: 17 13 16  (!) 32  Temp:      TempSrc:      SpO2: 100% 99% 98% 98%  Weight:         Vitals:   08/01/21 1530 08/01/21 1545 08/01/21 1600 08/01/21 1645  BP: (!) 151/86  (!) 162/99   Pulse: (!) 114 (!) 110 (!) 109 (!) 106  Resp: 17 13 16  (!) 32  Temp:      TempSrc:      SpO2: 100% 99% 98% 98%  Weight:      Constitutional: NAD, calm, comfortable Eyes: PERRL, lids and conjunctivae normal ENMT: Mucous membranes are moist. Posterior pharynx clear of any exudate or lesions.Normal dentition.  Neck: normal, supple, no masses, no thyromegaly Respiratory: clear to auscultation bilaterally, no wheezing, no crackles. Normal respiratory effort. No accessory muscle use.  Cardiovascular: Regular rate and rhythm, no murmurs / rubs / gallops. No  extremity edema. 2+ pedal pulses.  Abdomen: no tenderness, no masses palpated. No hepatosplenomegaly. Bowel sounds positive.  Musculoskeletal: no clubbing / cyanosis. 4th toe left foot with noted ulceration, drainage, redness and black discoloration. Good ROM, no contractures. Normal muscle tone.  Skin: no rashes, lesions, +ulcers left foot,with puncture wound as well. No induration Neurologic: CN 2-12 grossly intact. Sensation intact, . Strength 5/5 in all 4.  Psychiatric: Normal judgment and insight. Alert and oriented x 3. Normal mood.    Labs on Admission: I have personally reviewed following labs and imaging studies  CBC: Recent Labs  Lab 08/01/21 1519  WBC 8.3  NEUTROABS 6.5  HGB 12.5*  HCT 38.3*  MCV 93.4  PLT 324   Basic Metabolic Panel: Recent Labs  Lab 08/01/21 1519  NA 128*  K 4.6  CL 91*  CO2 27  GLUCOSE 725*  BUN 17  CREATININE 0.71  CALCIUM 9.0   GFR: Estimated Creatinine Clearance: 81.2 mL/min (by C-G formula based on SCr of 0.71 mg/dL). Liver Function Tests: Recent Labs  Lab 08/01/21 1519  AST 33  ALT 22  ALKPHOS 168*  BILITOT 0.9  PROT 7.8  ALBUMIN 3.6   No results for input(s): LIPASE, AMYLASE in the last 168 hours. No results for input(s): AMMONIA in the last 168 hours. Coagulation Profile: No results for input(s): INR, PROTIME in the last 168 hours. Cardiac Enzymes: No results for input(s): CKTOTAL, CKMB, CKMBINDEX, TROPONINI in the last 168 hours. BNP (last 3 results) No results for input(s): PROBNP in the last 8760 hours. HbA1C: No results for input(s): HGBA1C in the last 72 hours. CBG: No results for input(s): GLUCAP in the last 168 hours. Lipid Profile: No results for input(s): CHOL, HDL, LDLCALC, TRIG, CHOLHDL, LDLDIRECT in the last 72 hours. Thyroid Function Tests: No results for input(s): TSH, T4TOTAL, FREET4, T3FREE, THYROIDAB in the last 72 hours. Anemia Panel: No results for input(s): VITAMINB12, FOLATE, FERRITIN, TIBC,  IRON, RETICCTPCT in the last 72 hours. Urine analysis:    Component Value Date/Time   COLORURINE STRAW (A) 08/01/2021 1519   APPEARANCEUR CLEAR (A) 08/01/2021 1519   LABSPEC 1.026 08/01/2021 1519   PHURINE 7.0 08/01/2021 1519   GLUCOSEU >=500 (A) 08/01/2021 1519   HGBUR NEGATIVE 08/01/2021 1519   BILIRUBINUR NEGATIVE 08/01/2021 1519   KETONESUR NEGATIVE 08/01/2021 1519   PROTEINUR NEGATIVE 08/01/2021 1519   NITRITE NEGATIVE 08/01/2021 1519   LEUKOCYTESUR NEGATIVE 08/01/2021 1519    Radiological Exams on Admission: DG Ankle Complete Left  Result Date: 08/01/2021 CLINICAL DATA:  Evaluate for osteomyelitis. EXAM: LEFT ANKLE COMPLETE - 3+ VIEW COMPARISON:  MR 01/27/2021. FINDINGS: Mild diffuse soft tissue swelling. Extensive vascular calcifications are identified. No signs of acute fracture or dislocation. No suspicious bone erosions identified. IMPRESSION: 1. Soft tissue swelling . 2. No radiographic evidence for osteomyelitis. 3. Extensive vascular calcifications.  Electronically Signed   By: Signa Kellaylor  Stroud M.D.   On: 08/01/2021 16:42   DG Foot Complete Left  Result Date: 08/01/2021 CLINICAL DATA:  Fourth toe ulceration, oozing, osteomyelitis EXAM: LEFT FOOT - COMPLETE 3+ VIEW COMPARISON:  01/27/2021 FINDINGS: No fracture or dislocation of the left foot. Ulceration of the tip of the left fourth digit with bony erosion of the tuft of the left fourth distal phalanx. Flatfoot deformity. Vascular calcinosis. IMPRESSION: 1. Ulceration of the tip of the left fourth digit with bony erosion of the tuft of the left fourth distal phalanx, concerning for osteomyelitis. 2. Flatfoot deformity. Electronically Signed   By: Jearld LeschAlex D Bibbey M.D.   On: 08/01/2021 16:42    EKG: Independently reviewed.   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  -early sepsis  - admit to progressive  - continue broad spectrum abx  -ivfs -f/u on blood cultures  - consult podiatry (Dr Logan BoresEvans, Kipp BroodBrent)  DMII,  Uncontrolled  -patient notes he has not been compliant with his medications  Due to cost -last a1c 10.2 -place on iss, finger sticks per protocol   EtOH abuse -place on ciwa  -ETOH level  -patient states drinks occasionally , last drink 6 days ago -he denies history of withdrawals  DVT prophylaxis:  lovenox Code Status: full Family Communication: n/a i Disposition Plan: patient  expected to be admitted greater than 2 midnights  Consults called: podiatry Gala LewandowskyBrent Evans Admission status: inpatient  Lurline DelSara-Maiz A Brysun Eschmann MD [facility]   If 7PM-7AM, please contact night-coverage [email or web address] Password [email or web address]  08/01/2021, 5:49 PM

## 2021-08-01 NOTE — ED Notes (Signed)
Pt to ED for 3 wounds on L foot. One is from 1 week ago from stepping on nail. Larger wound to plantar surface of L foot has been there 6 months. Pt also has severely infected middle toe to L foot which he states has been there about 12 days. All wounds have purulent discharge with areas of skin that appear black and necrotic with strong smell. Pt denies pain.  Ran out of DM meds (metformin, insulin) 2 months ago and was unable to get appt at clinic.

## 2021-08-01 NOTE — ED Notes (Signed)
Will check CBG once boluses are complete.

## 2021-08-01 NOTE — ED Notes (Signed)
Informed providers face to face about glucose 725.  Pt is providing stool sample now.

## 2021-08-01 NOTE — ED Notes (Signed)
2 sets of blood cultures were sent to lab already. 1 set was ordered so far.

## 2021-08-01 NOTE — Progress Notes (Signed)
Inpatient Diabetes Program Recommendations  AACE/ADA: New Consensus Statement on Inpatient Glycemic Control (2015)  Target Ranges:  Prepandial:   less than 140 mg/dL      Peak postprandial:   less than 180 mg/dL (1-2 hours)      Critically ill patients:  140 - 180 mg/dL    Latest Reference Range & Units 08/01/21 15:19  Sodium 135 - 145 mmol/L 128 (L)  Potassium 3.5 - 5.1 mmol/L 4.6  Chloride 98 - 111 mmol/L 91 (L)  CO2 22 - 32 mmol/L 27  Glucose 70 - 99 mg/dL 725 (HH)  BUN 6 - 20 mg/dL 17  Creatinine 0.61 - 1.24 mg/dL 0.71  Calcium 8.9 - 10.3 mg/dL 9.0  Anion gap 5 - 15  10    Latest Reference Range & Units 08/01/21 18:08 08/01/21 18:44 08/01/21 20:06  Glucose-Capillary 70 - 99 mg/dL 462 (H)  5 units Novolog 325 (H) 361 (H  30 units Semglee  (H): Data is abnormally high    Admit with: Osteomyelitis L 4th Toe/ Hyperglycemia  History: DM, ETOH Abuse, Pancreatitis  Home DM Meds: Humalog 75/25 Insulin 30 units BID       Metformin 500 mg BID  Current Orders: Novolog Moderate Correction Scale/ SSI (0-15 units) TID AC      Novolog 3 units TID with meals      Semglee 30 units QHS     Per MD notes, pt has been on-adherent with Insulin due to cost issues Listed as self-pay--no insurance coverage No PCP listed Was given Information on [facility] at time of d/c during last admission in Dec 2022 Patient counseled by the Diabetes Coordinator RN during admission in Nov 2022 (01/28/2021)   Note SQ Insulin regimen started tonight  Current A1c level in process  Have placed TOC consult due to lack of insurance and no PCP listed  Will follow    --Will follow patient during hospitalization--  Cody Quaker RN, MSN, CDE Diabetes Coordinator Inpatient Glycemic Control Team Team Pager: [phone] (8a-5p)

## 2021-08-01 NOTE — ED Notes (Signed)
Received report from Reina RN. 

## 2021-08-01 NOTE — ED Triage Notes (Signed)
Pt comes pov with left foot 4th toe ulcer/infection. 4th toe is black with some redness and oozing. Pt also has another ulcer on the bottom of the same foot. Pt does have diabetes.

## 2021-08-01 NOTE — ED Notes (Addendum)
Second set of cultures and blue top drawn with IV stick and sent to lab.  Provider at bedside with remote interpreter.

## 2021-08-01 NOTE — Code Documentation (Signed)
CODE SEPSIS - PHARMACY COMMUNICATION  **Broad Spectrum Antibiotics should be administered within 1 hour of Sepsis diagnosis**  Time Code Sepsis Called/Page Received: 1616  Antibiotics Ordered: vancomycin, zosyn ordered @ 1615  Time of 1st antibiotic administration: 1630   Selinda Eon ,PharmD Clinical Pharmacist  08/01/2021  4:17 PM

## 2021-08-01 NOTE — ED Notes (Signed)
CBG now 462 after all fluid boluses.

## 2021-08-01 NOTE — ED Notes (Signed)
No BM at this time. Pt in bed. Provider auscultated L pedal pulse with doppler.

## 2021-08-01 NOTE — ED Provider Notes (Signed)
[facility] Provider Note    Event Date/Time   First MD Initiated Contact with Patient 08/01/21 1521     (approximate)   History   Chief Complaint Foot Ulcer   HPI Cody Cowan is a 56 y.o. male, history of alcohol abuse, diabetes, presents to the emergency department for evaluation of foot ulcer.  Patient states that he noticed his fourth left digit turning black approximately 12 days ago.  Reports mild pain, but overall numbness given his baseline diabetic neuropathy.  He additionally has 2 other lesions on the plantar aspect of his foot, one of them from a nail he stepped on 6 days ago, and another one that is been there for 6 months.  He has not had any of these lesions/ulcers evaluated by a provider yet.  Additional endorses chills at home.  He additionally states that he has not been on his diabetes medications (insulin, metformin) for the past few months due to lack of ability to get them from his regular clinic.  Denies chest pain, shortness of breath, abdominal pain, flank pain, nausea/vomiting, diarrhea, urinary symptoms, headache, vision changes, dizziness/lightheadedness, or other rash/lesions  History Limitations: Spanish-speaking.        Physical Exam  Triage Vital Signs: ED Triage Vitals  Enc Vitals Group     BP 08/01/21 1517 (!) 129/91     Pulse Rate 08/01/21 1517 (!) 117     Resp 08/01/21 1517 18     Temp 08/01/21 1517 97.9 F (36.6 C)     Temp Source 08/01/21 1517 Oral     SpO2 08/01/21 1517 96 %     Weight 08/01/21 1518 120 lb (54.4 kg)     Height --      Head Circumference --      Peak Flow --      Pain Score 08/01/21 1517 8     Pain Loc --      Pain Edu? --      Excl. in [place]? --     Most recent vital signs: Vitals:   08/01/21 1600 08/01/21 1645  BP: (!) 162/99   Pulse: (!) 109 (!) 106  Resp: 16 (!) 32  Temp:    SpO2: 98% 98%    General: Awake, NAD.  Skin: Warm, dry. No rashes or lesions.  Eyes: PERRL.  Conjunctivae normal.  CV: Good peripheral perfusion.  Resp: Normal effort.  Abd: Soft, non-tender. No distention.  Neuro: At baseline. No gross neurological deficits.   Focused Exam: Moderate swelling appreciated along the left foot/ankle diffusely with erythema.  Quarter sized ulcer appreciated along the plantar aspect of his foot, along with a 1 cm puncture wound.  His 4th left digit is grossly necrotic.  No sensation present.  Decreased sensation in both lower extremities.  Patient maintains normal range of motion, including flexion and extension at the ankle joint.  Pulses present on Doppler in the left foot/ankle.  Physical Exam    ED Results / Procedures / Treatments  Labs (all labs ordered are listed, but only abnormal results are displayed) Labs Reviewed  COMPREHENSIVE METABOLIC PANEL - Abnormal; Notable for the following components:      Result Value   Sodium 128 (*)    Chloride 91 (*)    Glucose, Bld 725 (*)    Alkaline Phosphatase 168 (*)    All other components within normal limits  CBC WITH DIFFERENTIAL/PLATELET - Abnormal; Notable for the following components:   RBC 4.10 (*)  Hemoglobin 12.5 (*)    HCT 38.3 (*)    All other components within normal limits  URINALYSIS, ROUTINE W REFLEX MICROSCOPIC - Abnormal; Notable for the following components:   Color, Urine STRAW (*)    APPearance CLEAR (*)    Glucose, UA >=500 (*)    All other components within normal limits  GASTROINTESTINAL PANEL BY PCR, STOOL (REPLACES STOOL CULTURE)  C DIFFICILE QUICK SCREEN W PCR REFLEX    CULTURE, BLOOD (SINGLE)  LACTIC ACID, PLASMA  LACTIC ACID, PLASMA  SEDIMENTATION RATE  CBG MONITORING, ED     EKG Sinus tachycardia, rate 104, no ST segment changes, normal QRS interval, no AV blocks, no QT prolongation.   RADIOLOGY  ED Provider Interpretation: I personally viewed and interpreted these x-rays, no acute abnormalities on ankle x-ray.  Foot x-ray shows bony erosion of the  tuft of the left fourth distal phalanx  DG Ankle Complete Left  Result Date: 08/01/2021 CLINICAL DATA:  Evaluate for osteomyelitis. EXAM: LEFT ANKLE COMPLETE - 3+ VIEW COMPARISON:  MR 01/27/2021. FINDINGS: Mild diffuse soft tissue swelling. Extensive vascular calcifications are identified. No signs of acute fracture or dislocation. No suspicious bone erosions identified. IMPRESSION: 1. Soft tissue swelling . 2. No radiographic evidence for osteomyelitis. 3. Extensive vascular calcifications. Electronically Signed   By: Kerby Moors M.D.   On: 08/01/2021 16:42   DG Foot Complete Left  Result Date: 08/01/2021 CLINICAL DATA:  Fourth toe ulceration, oozing, osteomyelitis EXAM: LEFT FOOT - COMPLETE 3+ VIEW COMPARISON:  01/27/2021 FINDINGS: No fracture or dislocation of the left foot. Ulceration of the tip of the left fourth digit with bony erosion of the tuft of the left fourth distal phalanx. Flatfoot deformity. Vascular calcinosis. IMPRESSION: 1. Ulceration of the tip of the left fourth digit with bony erosion of the tuft of the left fourth distal phalanx, concerning for osteomyelitis. 2. Flatfoot deformity. Electronically Signed   By: Delanna Ahmadi M.D.   On: 08/01/2021 16:42    PROCEDURES:  Critical Care performed: None.  Procedures    MEDICATIONS ORDERED IN ED: Medications  0.9 %  sodium chloride infusion (has no administration in time range)  insulin aspart (novoLOG) injection 5 Units (has no administration in time range)  HYDROmorphone (DILAUDID) injection 1 mg (1 mg Intravenous Given 08/01/21 1605)  sodium chloride 0.9 % bolus 1,000 mL (0 mLs Intravenous Stopped 08/01/21 1641)  vancomycin (VANCOCIN) IVPB 1000 mg/200 mL premix (1,000 mg Intravenous New Bag/Given 08/01/21 1638)  sodium chloride 0.9 % bolus 1,000 mL (1,000 mLs Intravenous New Bag/Given 08/01/21 1632)    And  sodium chloride 0.9 % bolus 500 mL (500 mLs Intravenous New Bag/Given 08/01/21 1642)    And  sodium chloride 0.9 %  bolus 250 mL (250 mLs Intravenous New Bag/Given 08/01/21 1643)  piperacillin-tazobactam (ZOSYN) IVPB 3.375 g (3.375 g Intravenous New Bag/Given 08/01/21 1630)     IMPRESSION / MDM / ASSESSMENT AND PLAN / ED COURSE  I reviewed the triage vital signs and the nursing notes.                              Differential diagnosis includes, but is not limited to, diabetic ulcer, cellulitis, osteomyelitis, foreign body,   ED Course Patient appears well, notably hypertensive at 151/86 and tachycardic at 114.  Currently afebrile.  We will go ahead treat with IV fluids and empiric vancomycin/Zosyn   CBC shows no leukocytosis or clinically significant anemia.  CMP notable for hyponatremia at 128, as well as elevated glucose at 725.  Alk phos notably elevated as well at 168.  Will initiate IV fluids.  Assessment/Plan Presentation concerning for osteomyelitis, especially given x-ray findings of bony erosion of the left fourth distal phalanx.  Currently afebrile, but meets SIRS criteria with tachycardia and tachypnea.  Initiated treatment with IV fluids, and vancomycin/Zosyn.  Patient additionally found to be significantly hyperglycemic, likely due to lack of access to his usual metformin/insulin treatment for the past few months.  No evidence of DKA.  Treated here with fluids and insulin.   Given these findings, will plan to admit for further evaluation management.  Spoke with the on-call hospitalist, who agreed to admission       FINAL CLINICAL IMPRESSION(S) / ED DIAGNOSES   Final diagnoses:  None     Rx / DC Orders   ED Discharge Orders     None        Note:  This document was prepared using Dragon voice recognition software and may include unintentional dictation errors.   Teodoro Spray, [place] 08/01/21 1751    Nena Polio, MD 08/03/21 [phone]

## 2021-08-01 NOTE — ED Notes (Signed)
First set of cultures, purple, lactic, and green sent to lab

## 2021-08-02 ENCOUNTER — Inpatient Hospital Stay: Payer: Self-pay

## 2021-08-02 ENCOUNTER — Other Ambulatory Visit: Payer: Self-pay

## 2021-08-02 DIAGNOSIS — F1029 Alcohol dependence with unspecified alcohol-induced disorder: Secondary | ICD-10-CM

## 2021-08-02 DIAGNOSIS — F1020 Alcohol dependence, uncomplicated: Secondary | ICD-10-CM

## 2021-08-02 DIAGNOSIS — M86672 Other chronic osteomyelitis, left ankle and foot: Secondary | ICD-10-CM

## 2021-08-02 DIAGNOSIS — E1165 Type 2 diabetes mellitus with hyperglycemia: Secondary | ICD-10-CM

## 2021-08-02 LAB — COMPREHENSIVE METABOLIC PANEL
ALT: 18 U/L (ref 0–44)
AST: 22 U/L (ref 15–41)
Albumin: 2.9 g/dL — ABNORMAL LOW (ref 3.5–5.0)
Alkaline Phosphatase: 139 U/L — ABNORMAL HIGH (ref 38–126)
Anion gap: 9 (ref 5–15)
BUN: 12 mg/dL (ref 6–20)
CO2: 25 mmol/L (ref 22–32)
Calcium: 8.2 mg/dL — ABNORMAL LOW (ref 8.9–10.3)
Chloride: 98 mmol/L (ref 98–111)
Creatinine, Ser: 0.48 mg/dL — ABNORMAL LOW (ref 0.61–1.24)
GFR, Estimated: >60 mL/min (ref >60)
Glucose, Bld: 257 mg/dL — ABNORMAL HIGH (ref 70–99)
Potassium: 3.6 mmol/L (ref 3.5–5.1)
Sodium: 132 mmol/L — ABNORMAL LOW (ref 135–145)
Total Bilirubin: 0.6 mg/dL (ref 0.3–1.2)
Total Protein: 6.2 g/dL — ABNORMAL LOW (ref 6.5–8.1)

## 2021-08-02 LAB — CBC
HCT: 32.8 % — ABNORMAL LOW (ref 39.0–52.0)
Hemoglobin: 11 g/dL — ABNORMAL LOW (ref 13.0–17.0)
MCH: 30.6 pg (ref 26.0–34.0)
MCHC: 33.5 g/dL (ref 30.0–36.0)
MCV: 91.4 fL (ref 80.0–100.0)
Platelets: 248 10*3/uL (ref 150–400)
RBC: 3.59 MIL/uL — ABNORMAL LOW (ref 4.22–5.81)
RDW: 11.5 % (ref 11.5–15.5)
WBC: 7.4 10*3/uL (ref 4.0–10.5)
nRBC: 0 % (ref 0.0–0.2)

## 2021-08-02 LAB — PROCALCITONIN: Procalcitonin: <0.1 ng/mL

## 2021-08-02 LAB — GLUCOSE, CAPILLARY
Glucose-Capillary: 343 mg/dL — ABNORMAL HIGH (ref 70–99)
Glucose-Capillary: 171 mg/dL — ABNORMAL HIGH (ref 70–99)

## 2021-08-02 LAB — CBG MONITORING, ED: Glucose-Capillary: 217 mg/dL — ABNORMAL HIGH (ref 70–99)

## 2021-08-02 IMAGING — MR MR FOOT*L* W/O CM
5 series · 40 of 40 positions shown · non-contrast
Comparison: Radiographs dated [DATE]

CLINICAL DATA: Foot swelling, diabetic, osteomyelitis suspected.

EXAM:
MRI OF THE LEFT FOOT WITHOUT CONTRAST
TECHNIQUE: Multiplanar, multisequence MR imaging of the left forefoot was
performed. No intravenous contrast was administered.

[Series 9: T1 · coronal · left · 3.0mm · 0.38mm/px · 12 of 45 slices shown (1 of 2)]
[im 1/45]
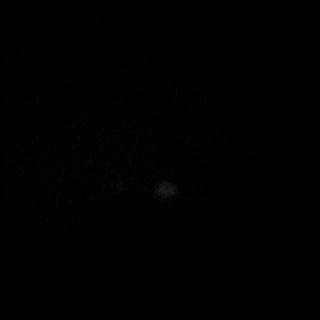
[im 5/45]
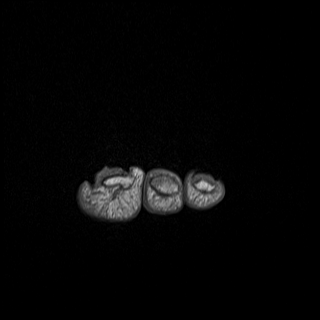
[im 9/45]
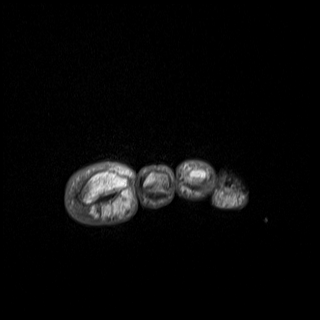
[im 13/45]
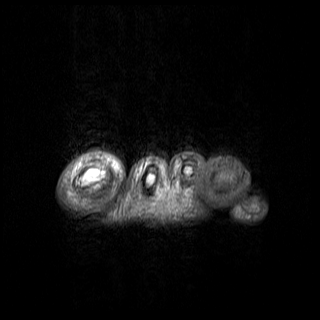
[im 17/45]
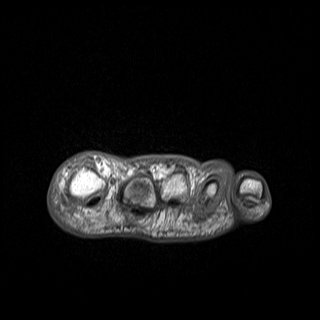
[im 21/45]
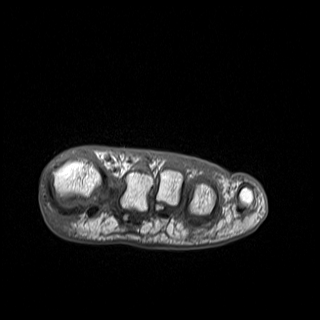
[im 25/45]
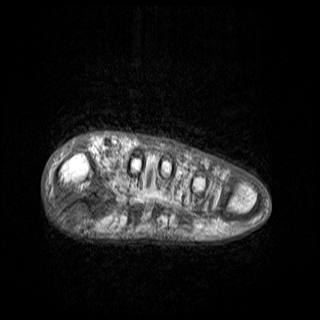
[im 29/45]
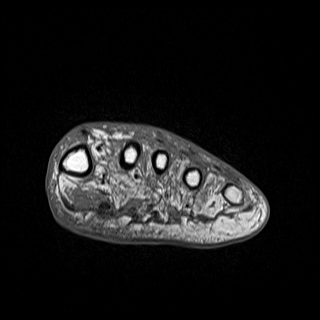
[im 33/45]
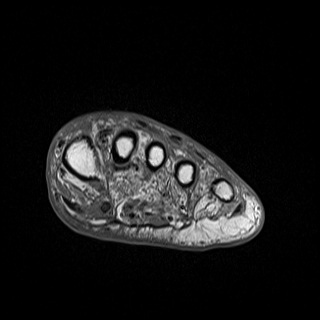
[im 37/45]
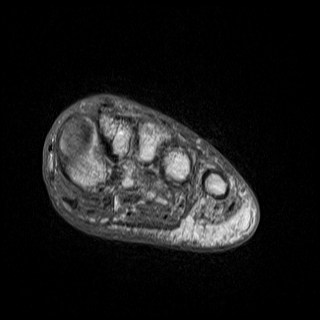
[im 41/45]
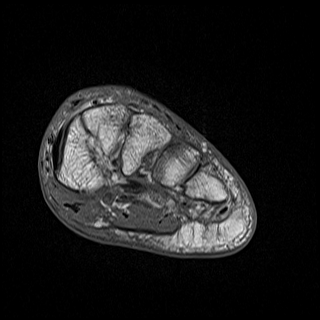
[im 45/45]
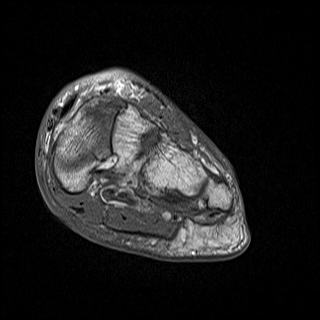

[Series 11: T2 · coronal · left · 3.0mm · 0.38mm/px · 11 of 45 slices shown (1 of 2)]
[im 1/45]
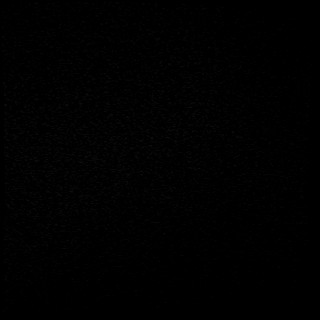
[im 5/45]
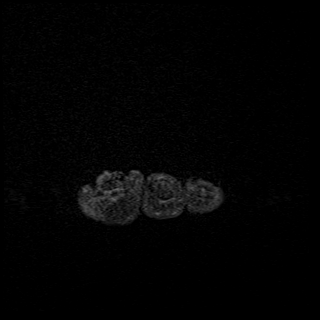
[im 9/45]
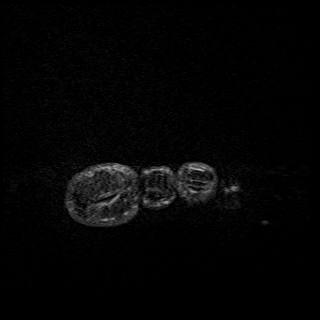
[im 14/45]
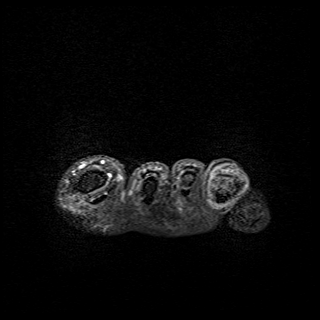
[im 18/45]
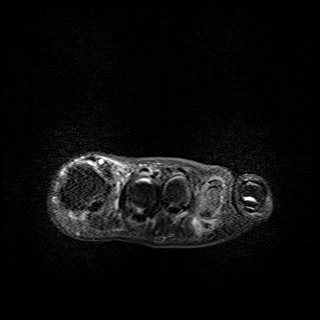
[im 23/45]
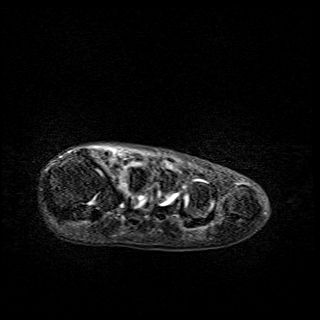
[im 27/45]
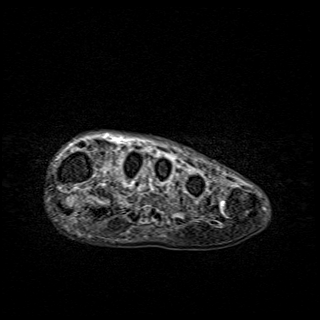
[im 31/45]
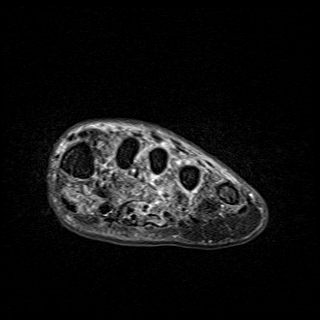
[im 36/45]
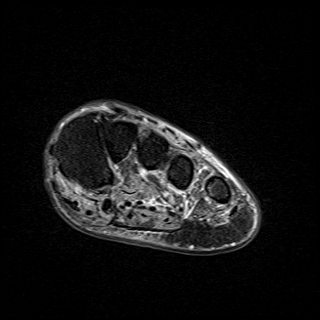
[im 40/45]
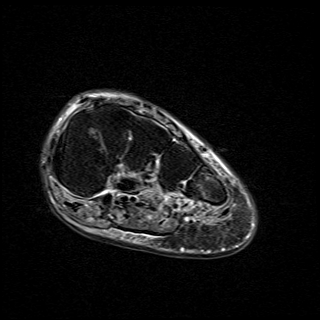
[im 45/45]
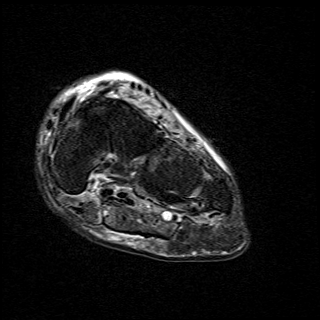

[Series 12: T1 · axial · left · 3.0mm · 0.70mm/px · z∈[-83,-19]mm · 5 of 20 slices shown (2 of 2)]
[im 1/20]
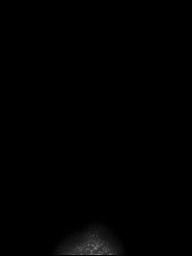
[im 5/20]
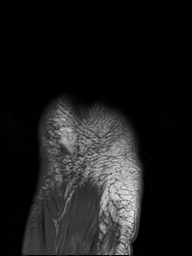
[im 10/20]
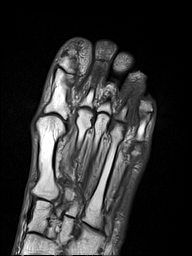
[im 15/20]
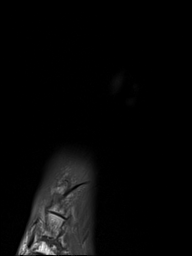
[im 20/20]
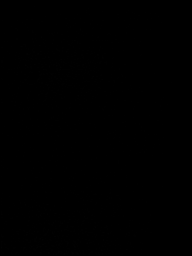

[Series 14: T2 · axial · left · 3.0mm · 0.70mm/px · z∈[-83,-23]mm · 5 of 19 slices shown (2 of 2)]
[im 1/19]
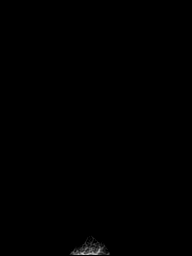
[im 5/19]
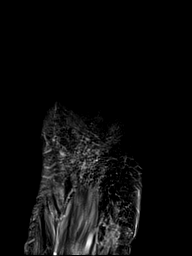
[im 10/19]
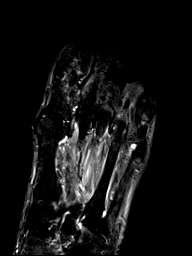
[im 14/19]
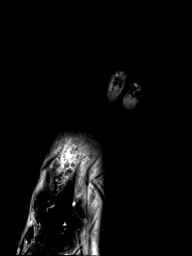
[im 19/19]
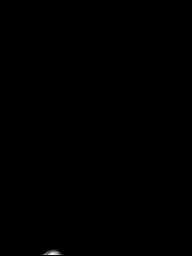

[Series 16: STIR · sagittal · left · 3.0mm · 0.62mm/px · 7 of 29 slices shown]
[im 1/29]
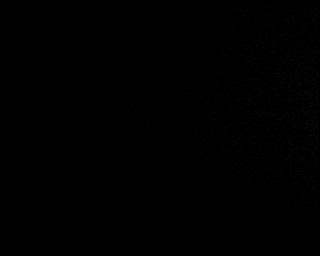
[im 5/29]
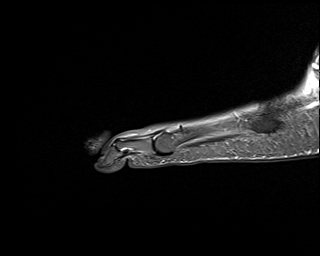
[im 10/29]
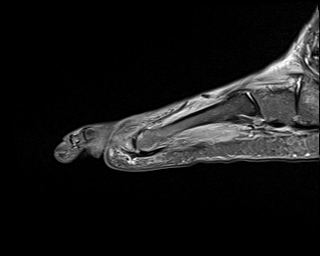
[im 15/29]
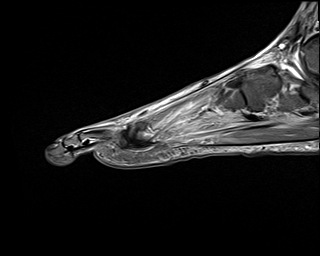
[im 19/29]
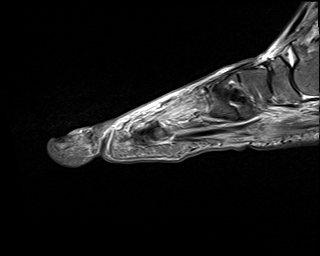
[im 24/29]
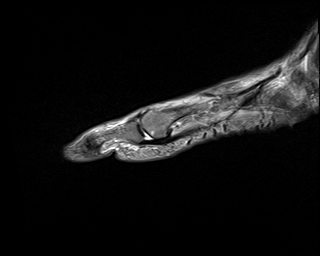
[im 29/29]
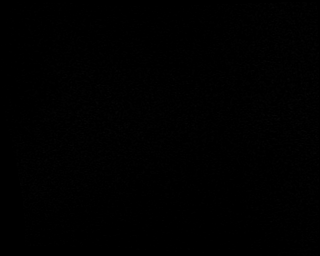

[40 of 40 positions shown; findings below may reference images not displayed]

FINDINGS: Bones/Joint/Cartilage

Bone marrow edema of the proximal and distal phalanges of the fourth
digit. Subchondral cystic changes of the first metatarsophalangeal
joint. Small cyst in the distal aspect of the proximal phalanx of
the second digit.

Ligaments

Lisfranc ligament is intact. Collateral ligaments appear maintained,
evaluation is limited due to motion.

Muscles and Tendons

Grease intramuscular signal of the plantar muscles concerning for
diabetic myopathy/myositis. No fluid collection or abscess.

Soft tissues

Marked subcutaneous soft tissue edema and inflammatory changes about
the fourth digit and a skin wound over the dorsal aspect of the
digit.
IMPRESSION: 1. Bone marrow edema of the proximal and distal phalanges of the
fourth digit with surrounding inflammatory changes and deep skin
wound concerning for acute osteomyelitis.
2. Degenerative changes prominent at the first metatarsophalangeal
joint and likely a small cyst in the distal aspect of the proximal
phalanx of the second digit.
3. Subcutaneous soft tissue edema and inflammatory changes about the
fourth digit. Diabetic myopathy/myositis of the plantar muscles. No
drainable fluid collection or abscess.

## 2021-08-02 IMAGING — MR MR ANKLE*L* W/O CM
5 series · 40 of 40 positions shown · non-contrast
Comparison: X-ray [DATE].

CLINICAL DATA: Foot swelling, diabetic, osteomyelitis suspected,
xray done

EXAM:
MRI OF THE LEFT ANKLE WITHOUT CONTRAST
TECHNIQUE: Multiplanar, multisequence MR imaging of the ankle was performed. No
intravenous contrast was administered.

[Series 1: PD fat-sat · axial · left · 3.0mm · 0.50mm/px · z∈[-58,+78]mm · 10 of 36 slices shown]
[im 1/36]
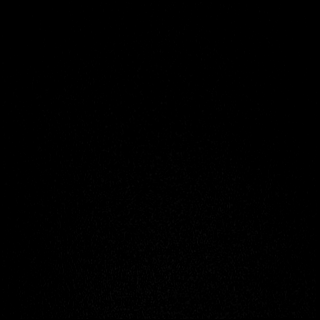
[im 4/36]
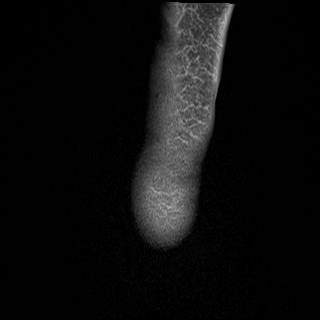
[im 8/36]
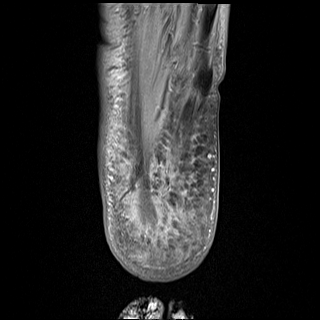
[im 12/36]
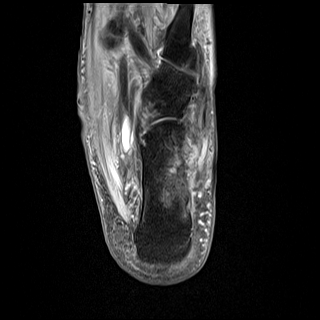
[im 16/36]
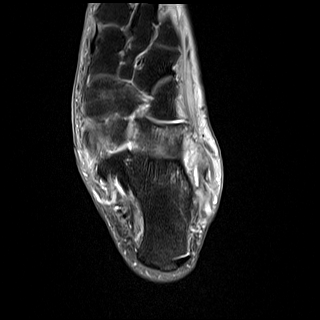
[im 20/36]
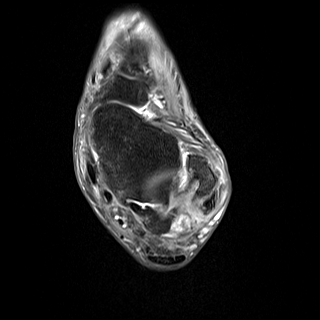
[im 24/36]
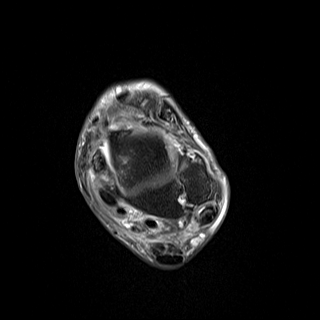
[im 28/36]
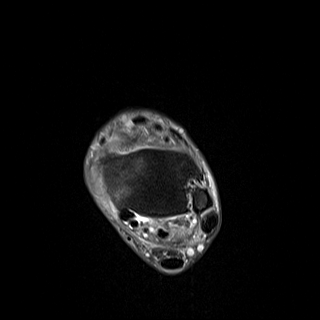
[im 32/36]
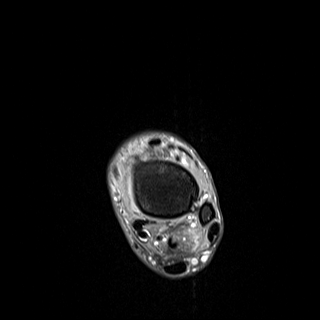
[im 36/36]
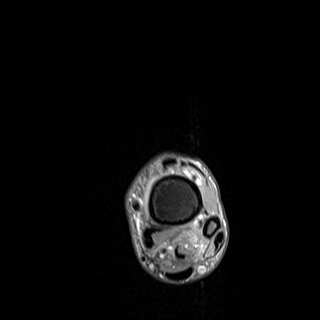

[Series 2: T2 fat-sat · axial · left · 3.0mm · 0.50mm/px · z∈[-58,+78]mm · 10 of 36 slices shown]
[im 1/36]
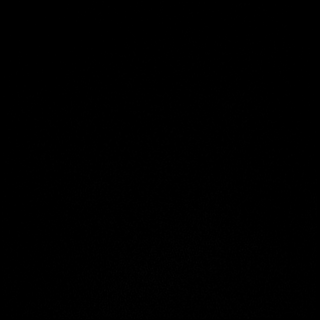
[im 4/36]
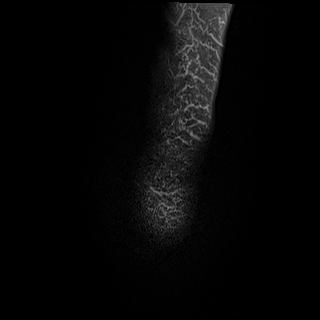
[im 8/36]
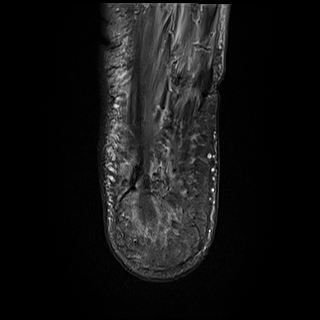
[im 12/36]
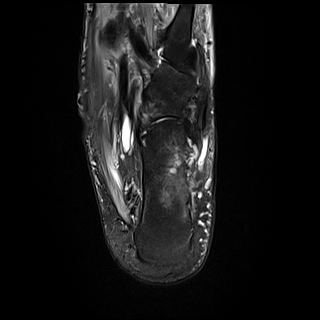
[im 16/36]
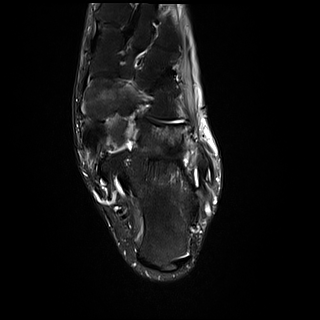
[im 20/36]
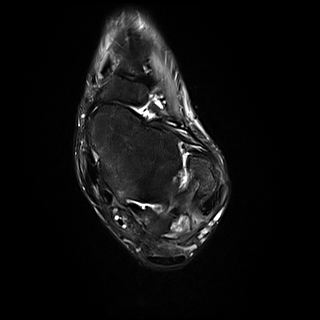
[im 24/36]
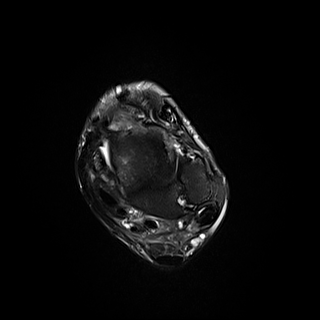
[im 28/36]
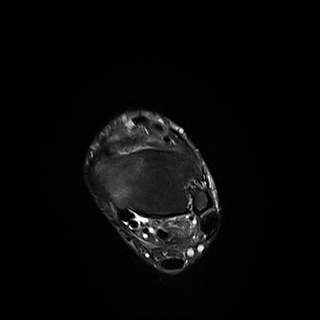
[im 32/36]
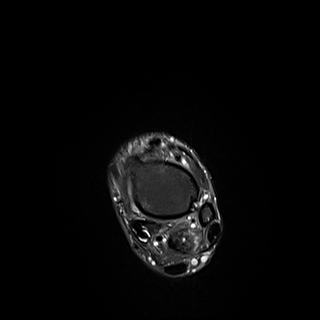
[im 36/36]
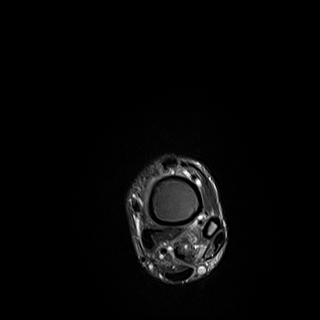

[Series 4: T2 · coronal · left · 3.0mm · 0.62mm/px · 10 of 40 slices shown]
[im 1/40]
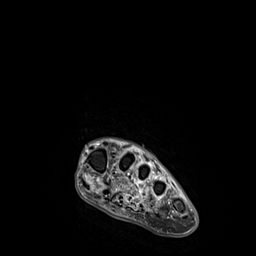
[im 5/40]
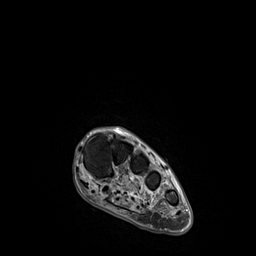
[im 9/40]
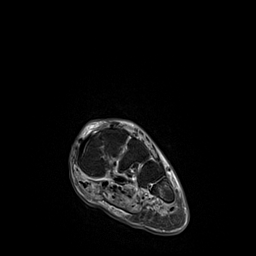
[im 14/40]
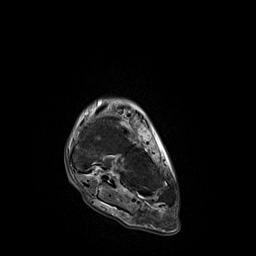
[im 18/40]
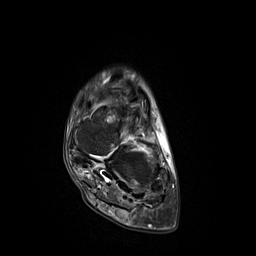
[im 22/40]
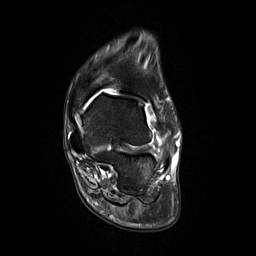
[im 27/40]
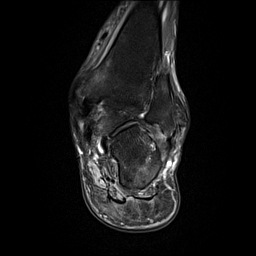
[im 31/40]
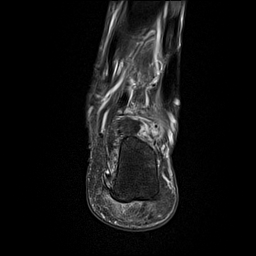
[im 35/40]
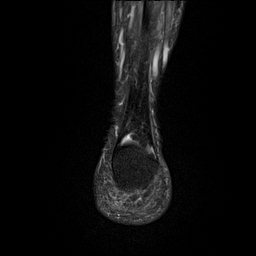
[im 40/40]
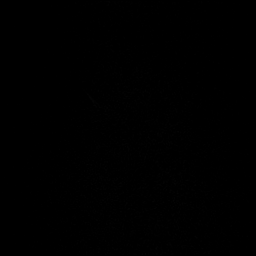

[Series 5: T1 · sagittal · left · 4.0mm · 0.70mm/px · 5 of 21 slices shown]
[im 1/21]
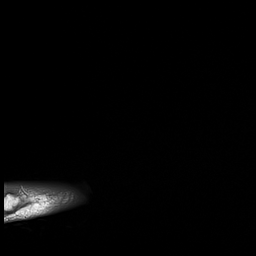
[im 6/21]
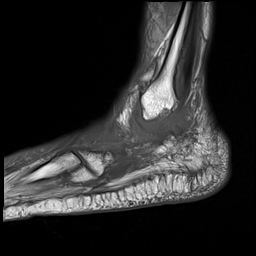
[im 11/21]
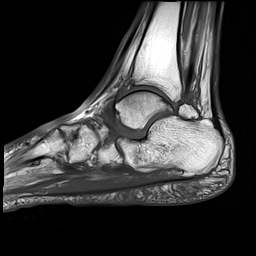
[im 16/21]
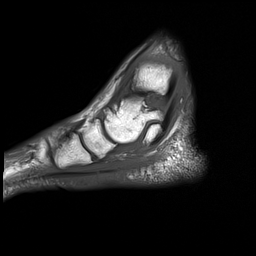
[im 21/21]
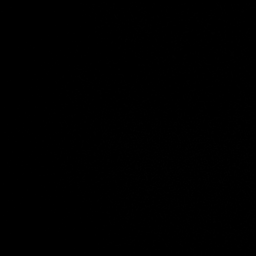

[Series 6: STIR · sagittal · left · 4.0mm · 0.35mm/px · 5 of 21 slices shown]
[im 1/21]
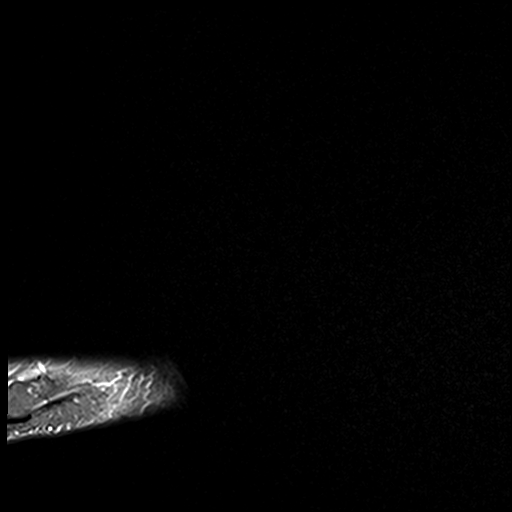
[im 6/21]
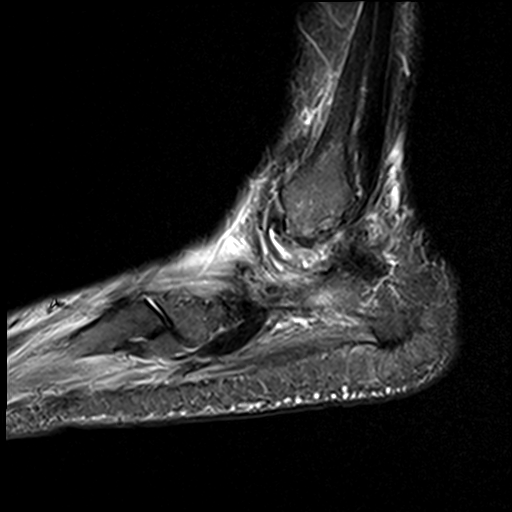
[im 11/21]
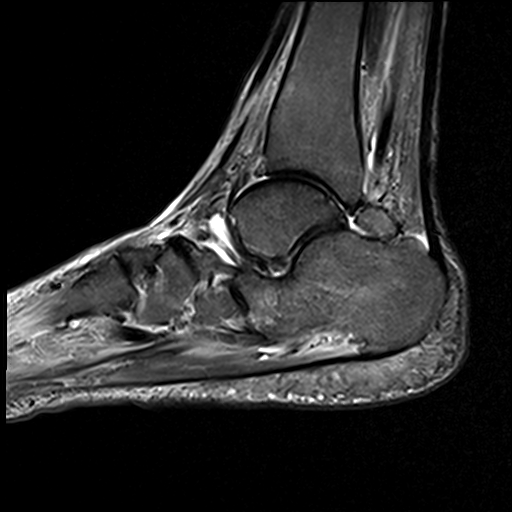
[im 16/21]
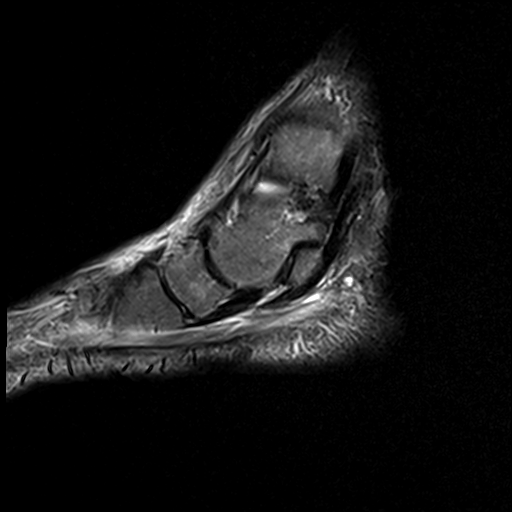
[im 21/21]
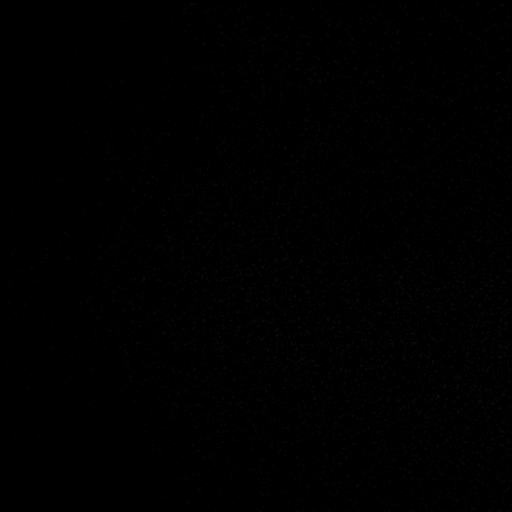

[40 of 40 positions shown; findings below may reference images not displayed]

FINDINGS: TENDONS

Peroneal: Severe tendinosis of the peroneus longus and brevis
tendons with complex tearing of the inframalleolar aspect of the
peroneus brevis tendon.

Posteromedial: Distal tibialis posterior tendinosis. The flexor
hallucis longus and flexor digitorum longus tendons are intact and
normally positioned.

Anterior: Tibialis anterior, extensor hallucis longus, and extensor
digitorum longus tendons are intact and normally positioned.

Achilles: Intact.

Plantar Fascia: Intact.

LIGAMENTS

Lateral: Intact tibiofibular ligaments. The anterior and posterior
talofibular ligaments and calcaneofibular ligament are thickened and
heterogeneous, but intact.

Medial: Heterogeneous appearance of the deltoid ligament. The
visualized portions of the spring ligament appear intact.

CARTILAGE AND BONES

Ankle Joint: Trace tibiotalar joint effusion. Cartilage thinning
with full-thickness fissuring along the anterior aspect of the
lateral talar shoulder. Small marginal osteophytes. Large os
trigonum preserved marrow signal.

Subtalar Joints/Sinus Tarsi: No cartilage defect. No effusion. Loss
of the normal anatomic fat signal within the sinus tarsi.

Bones: Pes planovalgus alignment. Patchy marrow edema within the
body of the calcaneus. No erosion. Preserved fatty T1 marrow signal
within the hindfoot. Prominent dorsal spurring of the talar head
with bone marrow edema. Small talonavicular joint effusion. No acute
fracture or dislocation.

Other: Mild soft tissue edema at the lateral aspect of the hindfoot.
No fluid collection.
IMPRESSION: 1. No convincing evidence of osteomyelitis involving the left ankle
or hindfoot.
2. Severe tendinosis of the peroneus longus and brevis tendons with
complex tearing of the peroneus brevis tendon.
3. Patchy bone marrow edema within the calcaneal body, which may be
stress related or reactive secondary to adjacent peroneal
tendinopathy.
4. Prominent dorsal spurring of the talar head with associated bone
marrow edema, nonspecific and may be reactive/degenerative.
5. Loss of the normal anatomic fat signal within the sinus tarsi.
Correlate for sinus tarsi syndrome.
6. Distal tibialis posterior tendinosis.
7. Pes planovalgus alignment.

## 2021-08-02 MED ORDER — PIPERACILLIN-TAZOBACTAM 3.375 G IVPB
3.3750 g | Freq: Three times a day (TID) | INTRAVENOUS | Status: DC
Start: 2021-08-02 — End: 2021-08-04
  Administered 2021-08-02 – 2021-08-04 (×6): 3.375 g via INTRAVENOUS
  Filled 2021-08-02 (×6): qty 50

## 2021-08-02 MED ORDER — VANCOMYCIN HCL 1250 MG/250ML IV SOLN
1250.0000 mg | Freq: Once | INTRAVENOUS | Status: AC
  Administered 2021-08-02: 1250 mg via INTRAVENOUS
  Filled 2021-08-02: qty 250

## 2021-08-02 MED ORDER — VANCOMYCIN HCL 750 MG/150ML IV SOLN
750.0000 mg | Freq: Two times a day (BID) | INTRAVENOUS | Status: DC
  Administered 2021-08-03 – 2021-08-06 (×7): 750 mg via INTRAVENOUS
  Filled 2021-08-02 (×8): qty 150

## 2021-08-02 NOTE — ED Notes (Signed)
Matthew RN aware of assigned bed 

## 2021-08-02 NOTE — Assessment & Plan Note (Signed)
   TOC may be able to help w/ Rx resources on discharge  ISS while inpatient   Last A1C 10.2

## 2021-08-02 NOTE — Progress Notes (Signed)
PROGRESS NOTE    Cody Cowan  ZOX:[id number] DOB: [birth year]  DOA: 08/01/2021 Date of Service: 08/02/21 PCP: Cody Cowan, No Pcp Per (Inactive)     Brief Narrative / Hospital Course:  Cody Cowan Hair is a 56 y.o. male with medical  history significant for alcohol abuse, pancreatitis, DM2 who presents to ED 08/01/21 with complaint of 4th toe infection- redness and drainage as well as black discoloration, plus chronic ulcer on left foot x 6 months but noted no drainage or changes recently but has intermittent pain. He also endorses stepping on a nail and notes puncture wound above his chronic ulcer.    Consultants:  Podiatry   Procedures: None     Subjective: Cody Cowan reports feeling well, pain controlled, no concerns (RN present to help translate, I speak some Spanish as well)      ASSESSMENT & PLAN:   Principal Problem:   Osteomyelitis (HCC) Active Problems:   Uncontrolled type 2 diabetes mellitus with hyperglycemia, without long-term current use of insulin (HCC)   EtOH dependence (HCC)    Osteomyelitis (HCC) Tachycardia but NO fever, tachypnea, leukocytosis Today 08/02/21 is Day 2 abx: Zosyn and Vanc MRI pending  Podiatry: Plan for surgery tomorrow after 5pm pending MRI results. NPO after 9am after full breakfast follow cultures   Uncontrolled type 2 diabetes mellitus with hyperglycemia, without long-term current use of insulin (HCC) TOC may be able to help w/ Rx resources on discharge ISS while inpatient  Last A1C 10.2  EtOH dependence (HCC) Cody Cowan states drinks occasionally , last drink 6 days ago he denies history of withdrawals CIWA protocol     DVT prophylaxis: lovenox Code Status: FULL Family Communication: none at this eim Disposition Plan / TOC needs: Initially admitted to progressive given tachycardia and significant elevated Glc but these were treated/resolved. May need SNF/HH pending surgery  Barriers to discharge /  significant pending items: surgery tomorrow, continued IV abx awaiting culture              Objective: Vitals:   08/02/21 0600 08/02/21 0809 08/02/21 1100 08/02/21 1259  BP: 127/81 115/69 (!) 153/92 (!) 165/96  Pulse: 81 95 90 95  Resp: Temp:  98.2 F (36.8 C) 98.8 F (37.1 C) 97.9 F (36.6 C)  TempSrc:  Oral Oral   SpO2: 100% 99% 100% 100%  Weight:        Intake/Output Summary (Last 24 hours) at 08/02/2021 1457 Last data filed at 08/02/2021 0800 Gross per 24 hour  Intake 3140.4 ml  Output 2880 ml  Net 260.4 ml   Filed Weights   08/01/21 1518  Weight: 54.4 kg    Examination:  Constitutional:  VS as above General Appearance: alert, well-developed, well-nourished, NAD Neck: No masses, trachea midline Respiratory: Normal respiratory effort Breath sounds normal, no wheeze/rhonchi/rales Cardiovascular: S1/S2 normal, no murmur/rub/gallop auscultated No lower extremity edema Gastrointestinal: Nontender, no masses Neurological: No cranial nerve deficit on limited exam Psychiatric: Normal mood and affect       Scheduled Medications:   enoxaparin (LOVENOX) injection  40 mg Subcutaneous Q24H   insulin aspart  0-15 Units Subcutaneous TID WC   insulin aspart  3 Units Subcutaneous TID WC   insulin glargine-yfgn  30 Units Subcutaneous QHS   sodium chloride flush  3 mL Intravenous Q12H    Continuous Infusions:  sodium chloride     piperacillin-tazobactam (ZOSYN)  IV 3.375 g (08/02/21 1328)   vancomycin 1,250 mg (08/02/21 1335)   Followed by  vancomycin      PRN Medications:  acetaminophen **OR** acetaminophen, albuterol, dextrose, HYDROcodone-acetaminophen, morphine injection, ondansetron **OR** ondansetron (ZOFRAN) IV  Antimicrobials:  Anti-infectives (From admission, onward)    Start     Dose/Rate Route Frequency Ordered Stop   08/02/21 2200  vancomycin (VANCOREADY) IVPB 750 mg/150 mL       See Hyperspace for full Linked Orders  Report.   750 mg 150 mL/hr over 60 Minutes Intravenous Every 12 hours 08/02/21 1308     08/02/21 1315  piperacillin-tazobactam (ZOSYN) IVPB 3.375 g        3.375 g 12.5 mL/hr over 240 Minutes Intravenous Every 8 hours 08/02/21 1308     08/02/21 1315  vancomycin (VANCOREADY) IVPB 1250 mg/250 mL       See Hyperspace for full Linked Orders Report.   1,250 mg 166.7 mL/hr over 90 Minutes Intravenous  Once 08/02/21 1308     08/01/21 1630  vancomycin (VANCOCIN) IVPB 1000 mg/200 mL premix        1,000 mg 200 mL/hr over 60 Minutes Intravenous  Once 08/01/21 1616 08/01/21 1752   08/01/21 1630  piperacillin-tazobactam (ZOSYN) IVPB 3.375 g        3.375 g 100 mL/hr over 30 Minutes Intravenous  Once 08/01/21 1616 08/01/21 1752       Data Reviewed: I have personally reviewed following labs and imaging studies  CBC: Recent Labs  Lab 08/01/21 1519 08/02/21 0409  WBC 8.3 7.4  NEUTROABS 6.5  --   HGB 12.5* 11.0*  HCT 38.3* 32.8*  MCV 93.4 91.4  PLT 324 248   Basic Metabolic Panel: Recent Labs  Lab 08/01/21 1519 08/01/21 1847 08/02/21 0409  NA 128* 133* 132*  K 4.6 3.9 3.6  CL 91* 101 98  CO2 GLUCOSE 725* 338* 257*  BUN CREATININE 0.71 0.53* 0.48*  CALCIUM 9.0 8.1* 8.2*   GFR: Estimated Creatinine Clearance: 81.2 mL/min (A) (by C-G formula based on SCr of 0.48 mg/dL (L)). Liver Function Tests: Recent Labs  Lab 08/01/21 1519 08/01/21 1847 08/02/21 0409  AST 33 28 22  ALT ALKPHOS 168* 170* 139*  BILITOT 0.9 0.8 0.6  PROT 7.8 6.6 6.2*  ALBUMIN 3.6 3.1* 2.9*   No results for input(s): LIPASE, AMYLASE in the last 168 hours. No results for input(s): AMMONIA in the last 168 hours. Coagulation Profile: No results for input(s): INR, PROTIME in the last 168 hours. Cardiac Enzymes: No results for input(s): CKTOTAL, CKMB, CKMBINDEX, TROPONINI in the last 168 hours. BNP (last 3 results) No results for input(s): PROBNP in the last 8760  hours. HbA1C: Recent Labs    08/01/21 1847  HGBA1C 14.8*   CBG: Recent Labs  Lab 08/01/21 1808 08/01/21 1844 08/01/21 2006 08/02/21 0807  GLUCAP 462* 325* 361* 217*   Lipid Profile: No results for input(s): CHOL, HDL, LDLCALC, TRIG, CHOLHDL, LDLDIRECT in the last 72 hours. Thyroid Function Tests: Recent Labs    08/01/21 1847  TSH 11.550*   Anemia Panel: No results for input(s): VITAMINB12, FOLATE, FERRITIN, TIBC, IRON, RETICCTPCT in the last 72 hours. Urine analysis:    Component Value Date/Time   COLORURINE STRAW (A) 08/01/2021 1519   APPEARANCEUR CLEAR (A) 08/01/2021 1519   LABSPEC 1.026 08/01/2021 1519   PHURINE 7.0 08/01/2021 1519   GLUCOSEU >=500 (A) 08/01/2021 1519   HGBUR NEGATIVE 08/01/2021 1519   BILIRUBINUR NEGATIVE 08/01/2021 1519   KETONESUR NEGATIVE 08/01/2021 1519  PROTEINUR NEGATIVE 08/01/2021 1519   NITRITE NEGATIVE 08/01/2021 1519   LEUKOCYTESUR NEGATIVE 08/01/2021 1519   Sepsis Labs: @LABRCNTIP (procalcitonin:4,lacticidven:4)  Recent Results (from the past 240 hour(s))  Gastrointestinal Panel by PCR , Stool     Status: None   Collection Time: 08/01/21  3:19 PM   Specimen: Stool  Result Value Ref Range Status   Campylobacter species NOT DETECTED NOT DETECTED Final   Plesimonas shigelloides NOT DETECTED NOT DETECTED Final   Salmonella species NOT DETECTED NOT DETECTED Final   Yersinia enterocolitica NOT DETECTED NOT DETECTED Final   Vibrio species NOT DETECTED NOT DETECTED Final   Vibrio cholerae NOT DETECTED NOT DETECTED Final   Enteroaggregative E coli (EAEC) NOT DETECTED NOT DETECTED Final   Enteropathogenic E coli (EPEC) NOT DETECTED NOT DETECTED Final   Enterotoxigenic E coli (ETEC) NOT DETECTED NOT DETECTED Final   Shiga like toxin producing E coli (STEC) NOT DETECTED NOT DETECTED Final   Shigella/Enteroinvasive E coli (EIEC) NOT DETECTED NOT DETECTED Final   Cryptosporidium NOT DETECTED NOT DETECTED Final   Cyclospora cayetanensis  NOT DETECTED NOT DETECTED Final   Entamoeba histolytica NOT DETECTED NOT DETECTED Final   Giardia lamblia NOT DETECTED NOT DETECTED Final   Adenovirus F40/41 NOT DETECTED NOT DETECTED Final   Astrovirus NOT DETECTED NOT DETECTED Final   Norovirus GI/GII NOT DETECTED NOT DETECTED Final   Rotavirus A NOT DETECTED NOT DETECTED Final   Sapovirus (I, II, IV, and V) NOT DETECTED NOT DETECTED Final    Comment: Performed at [facility], [address] [place], [place] [address]  C Difficile Quick Screen w PCR reflex     Status: None   Collection Time: 08/01/21  3:19 PM   Specimen: Stool  Result Value Ref Range Status   C Diff antigen NEGATIVE NEGATIVE Final   C Diff toxin NEGATIVE NEGATIVE Final   C Diff interpretation No C. difficile detected.  Final    Comment: Performed at [facility], [address] [place], [place] [address]  Culture, blood (single)     Status: None (Preliminary result)   Collection Time: 08/01/21  3:19 PM   Specimen: BLOOD  Result Value Ref Range Status   Specimen Description BLOOD [facility]  Final   Special Requests   Final    BOTTLES DRAWN AEROBIC AND ANAEROBIC Blood Culture adequate volume   Culture   Final    NO GROWTH < 12 HOURS Performed at [facility], [address] [place], [place] [address]    Report Status PENDING  Incomplete         Radiology Studies last 96 hours: DG Ankle Complete Left  Result Date: 08/01/2021 CLINICAL DATA:  Evaluate for osteomyelitis. EXAM: LEFT ANKLE COMPLETE - 3+ VIEW COMPARISON:  MR 01/27/2021. FINDINGS: Mild diffuse soft tissue swelling. Extensive vascular calcifications are identified. No signs of acute fracture or dislocation. No suspicious bone erosions identified. IMPRESSION: 1. Soft tissue swelling . 2. No radiographic evidence for osteomyelitis. 3. Extensive vascular calcifications. Electronically Signed   By: Signa Kellaylor  Stroud M.D.   On: 08/01/2021 16:42   MR FOOT LEFT WO  CONTRAST  Result Date: 08/02/2021 CLINICAL DATA:  Foot swelling, diabetic, osteomyelitis suspected. EXAM: MRI OF THE LEFT FOOT WITHOUT CONTRAST TECHNIQUE: Multiplanar, multisequence MR imaging of the left forefoot was performed. No intravenous contrast was administered. COMPARISON:  Radiographs dated Aug 01, 2021 FINDINGS: Bones/Joint/Cartilage Bone marrow edema of the proximal and distal phalanges of the fourth digit. Subchondral cystic changes of  the first metatarsophalangeal joint. Small cyst in the distal aspect of the proximal phalanx of the second digit. Ligaments Lisfranc ligament is intact. Collateral ligaments appear maintained, evaluation is limited due to motion. Muscles and Tendons Grease intramuscular signal of the plantar muscles concerning for diabetic myopathy/myositis. No fluid collection or abscess. Soft tissues Marked subcutaneous soft tissue edema and inflammatory changes about the fourth digit and a skin wound over the dorsal aspect of the digit. IMPRESSION: 1. Bone marrow edema of the proximal and distal phalanges of the fourth digit with surrounding inflammatory changes and deep skin wound concerning for acute osteomyelitis. 2. Degenerative changes prominent at the first metatarsophalangeal joint and likely a small cyst in the distal aspect of the proximal phalanx of the second digit. 3. Subcutaneous soft tissue edema and inflammatory changes about the fourth digit. Diabetic myopathy/myositis of the plantar muscles. No drainable fluid collection or abscess. Electronically Signed   By: Larose Hires D.O.   On: 08/02/2021 12:38   MR ANKLE LEFT WO CONTRAST  Result Date: 08/02/2021 CLINICAL DATA:  Foot swelling, diabetic, osteomyelitis suspected, xray done EXAM: MRI OF THE LEFT ANKLE WITHOUT CONTRAST TECHNIQUE: Multiplanar, multisequence MR imaging of the ankle was performed. No intravenous contrast was administered. COMPARISON:  X-ray 08/01/2021. FINDINGS: TENDONS Peroneal: Severe tendinosis  of the peroneus longus and brevis tendons with complex tearing of the inframalleolar aspect of the peroneus brevis tendon. Posteromedial: Distal tibialis posterior tendinosis. The flexor hallucis longus and flexor digitorum longus tendons are intact and normally positioned. Anterior: Tibialis anterior, extensor hallucis longus, and extensor digitorum longus tendons are intact and normally positioned. Achilles: Intact. Plantar Fascia: Intact. LIGAMENTS Lateral: Intact tibiofibular ligaments. The anterior and posterior talofibular ligaments and calcaneofibular ligament are thickened and heterogeneous, but intact. Medial: Heterogeneous appearance of the deltoid ligament. The visualized portions of the spring ligament appear intact. CARTILAGE AND BONES Ankle Joint: Trace tibiotalar joint effusion. Cartilage thinning with full-thickness fissuring along the anterior aspect of the lateral talar shoulder. Small marginal osteophytes. Large os trigonum preserved marrow signal. Subtalar Joints/Sinus Tarsi: No cartilage defect. No effusion. Loss of the normal anatomic fat signal within the sinus tarsi. Bones: Pes planovalgus alignment. Patchy marrow edema within the body of the calcaneus. No erosion. Preserved fatty T1 marrow signal within the hindfoot. Prominent dorsal spurring of the talar head with bone marrow edema. Small talonavicular joint effusion. No acute fracture or dislocation. Other: Mild soft tissue edema at the lateral aspect of the hindfoot. No fluid collection. IMPRESSION: 1. No convincing evidence of osteomyelitis involving the left ankle or hindfoot. 2. Severe tendinosis of the peroneus longus and brevis tendons with complex tearing of the peroneus brevis tendon. 3. Patchy bone marrow edema within the calcaneal body, which may be stress related or reactive secondary to adjacent peroneal tendinopathy. 4. Prominent dorsal spurring of the talar head with associated bone marrow edema, nonspecific and may be  reactive/degenerative. 5. Loss of the normal anatomic fat signal within the sinus tarsi. Correlate for sinus tarsi syndrome. 6. Distal tibialis posterior tendinosis. 7. Pes planovalgus alignment. Electronically Signed   By: Duanne Guess D.O.   On: 08/02/2021 12:46   DG Foot Complete Left  Result Date: 08/01/2021 CLINICAL DATA:  Fourth toe ulceration, oozing, osteomyelitis EXAM: LEFT FOOT - COMPLETE 3+ VIEW COMPARISON:  01/27/2021 FINDINGS: No fracture or dislocation of the left foot. Ulceration of the tip of the left fourth digit with bony erosion of the tuft of the left fourth distal phalanx. Flatfoot deformity. Vascular calcinosis. IMPRESSION:  1. Ulceration of the tip of the left fourth digit with bony erosion of the tuft of the left fourth distal phalanx, concerning for osteomyelitis. 2. Flatfoot deformity. Electronically Signed   By: Jearld Lesch M.D.   On: 08/01/2021 16:42            LOS: 1 day        Sunnie Nielsen, DO [facility] 08/02/2021, 2:57 PM   Staff may message me via secure chat in Epic  but this may not receive immediate response,  please page for urgent matters!  If 7PM-7AM, please contact night-coverage [email or web address]  Dictation software was used to generate the above note. Typos may occur and escape review, as with typed/written notes. Please contact Dr Lyn Hollingshead directly for clarity if needed.

## 2021-08-02 NOTE — Progress Notes (Signed)
Inpatient Diabetes Program Recommendations  AACE/ADA: New Consensus Statement on Inpatient Glycemic Control (2015)  Target Ranges:  Prepandial:   less than 140 mg/dL      Peak postprandial:   less than 180 mg/dL (1-2 hours)      Critically ill patients:  140 - 180 mg/dL    Latest Reference Range & Units 01/28/21 06:26 08/01/21 18:47  Hemoglobin A1C 4.8 - 5.6 % 10.2 (H)  246 mg/dl 10.2 (H)  585 mg/dl  (H): Data is abnormally high  Latest Reference Range & Units 08/01/21 18:08 08/01/21 18:44 08/01/21 20:06  Glucose-Capillary 70 - 99 mg/dL 277 (H)  5 units Novolog  325 (H) 361 (H)  30 units Semglee  (H): Data is abnormally high  Latest Reference Range & Units 08/02/21 08:07  Glucose-Capillary 70 - 99 mg/dL 824 (H)  8 units Novolog   (H): Data is abnormally high   Admit with: Osteomyelitis L 4th Toe/ Hyperglycemia   History: DM, ETOH Abuse, Pancreatitis   Home DM Meds: Humalog 75/25 Insulin 30 units BID                             Metformin 500 mg BID   Current Orders: Novolog Moderate Correction Scale/ SSI (0-15 units) TID AC                            Novolog 3 units TID with meals                            Semglee 30 units QHS       MD- Note Semglee insulin started last PM.  CBGs >200 today:  Please consider:  1. Increase Semglee to 35 units QHS  2. Increase Novolog Meal Coverage to 5 units TID with meals  A1c significantly worse than last one on file (14.8%).  Per MD H&P note, pt has been non-adherent with Insulin due to cost issues  Was given Information on [facility] at time of d/c during last admission in Dec 2022 Patient counseled by the Diabetes Coordinator RN during admission in Nov 2022 (01/28/2021)   Have placed [facility] consult due to lack of insurance and no PCP listed   --Will follow patient during hospitalization--  Ambrose Finland RN, MSN, CDE Diabetes Coordinator Inpatient Glycemic Control  Team Team Pager: [phone] (8a-5p)

## 2021-08-02 NOTE — Consult Note (Signed)
PODIATRY CONSULTATION  NAME Cody Cowan MRN [id number] DOB [birth year] DOA 08/01/2021   Reason for consult: Osteomyelitis left foot Chief Complaint  Patient presents with   Foot Ulcer    Consulting physician: Myles Rosenthal MD  History of present illness: 56 y.o. male PMHx DM2; uncontrolled, alcohol abuse, presenting for worsening infection left foot ulcers. Presents in ED waiting for bed placement. Nurse Technician present for [place] interpreter. 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 He has had a chronic ulcer to the left foot for >94mos. Has noticed changes recently. Reports stepping on a nail. Xrays positive for osteomyelitis left fourth toe. Started on Vancomycin 1g and Zoxyn 3.375g. Tdap 0.14mL administered as well. Admission orders placed and podiatry consulted.   Past Medical History:  Diagnosis Date   Diabetes ([place])        Latest Ref Rng & Units 08/02/2021    4:09 AM 08/01/2021    3:19 PM 03/02/2021    5:56 AM  CBC  WBC 4.0 - 10.5 K/uL 7.4   8.3   4.6    Hemoglobin 13.0 - 17.0 g/dL 11.0   12.5   11.8    Hematocrit 39.0 - 52.0 % 32.8   38.3   34.4    Platelets 150 - 400 K/uL 248   324   123         Latest Ref Rng & Units 08/02/2021    4:09 AM 08/01/2021    6:47 PM 08/01/2021    3:19 PM  BMP  Glucose 70 - 99 mg/dL 257   338   725    BUN 6 - 20 mg/dL 12   12   17     Creatinine 0.61 - 1.24 mg/dL 0.48   0.53   0.71    Sodium 135 - 145 mmol/L 132   133   128    Potassium 3.5 - 5.1 mmol/L 3.6   3.9   4.6    Chloride 98 - 111 mmol/L 98   101   91    CO2 22 - 32 mmol/L 25   27   27     Calcium 8.9 - 10.3 mg/dL 8.2   8.1   9.0          Focused Physical Exam: General: The patient is alert and oriented x3 in no acute distress.   Dermatology: Necrotic fourth toe LT foot with drainage. Also necrotic wounds noted to the plantar aspect of the foot secondary to severe flatfoot and bony prominance as well as reported history of stepping on a  nail. No significant malodor.   Vascular: DP and PT pulses are palpable. Skin warm to touch. Moderate edema noted to the forefoot.   Neurological: Light touch and protective threshold absent bilateral  Musculoskeletal Exam: Flatfoot deformity noted contributing to the ulcers of the plantar arch of the foot.  DG ANKLE COMPLETE LT 08/01/2021 IMPRESSION: 1. Soft tissue swelling . 2. No radiographic evidence for osteomyelitis. 3. Extensive vascular calcifications.  DG FOOT COMPLETE LT 08/01/2021 IMPRESSION: 1. Ulceration of the tip of the left fourth digit with bony erosion of the tuft of the left fourth distal phalanx, concerning for osteomyelitis. 2. Flatfoot deformity.  ASSESSMENT/PLAN OF CARE Suspected osteomyelitis LT 4th toe and possible rearfoot. 2. Cellulitis left foot 3. Uncontrolled DM2 w/ peripheral neuropathy  - MRI ordered LT foot. Prior MRI performed 01/27/2021 for same concern of OM. Compare to prior for changes concerning for osteomyelitis - Pulses are palpable and  maintained despite vascular calcifications on xray. Skin warm to touch.  - Continue IV abx as per admitting physician - Plan for surgery tomorrow after 5pm pending MRI results. NPO after 9am after full breakfast - Will follow   Thank you for the consult.    Edrick Kins, DPM [facility]  Dr. Edrick Kins, DPM    [address], [place] [address]                Office [phone]  Fax [phone]

## 2021-08-02 NOTE — Assessment & Plan Note (Signed)
patient states drinks occasionally , last drink 6 days ago he denies history of withdrawals  CIWA protocol

## 2021-08-02 NOTE — Assessment & Plan Note (Signed)
   Tachycardia but NO fever, tachypnea, leukocytosis  Today 08/02/21 is Day 2 abx: Zosyn and Vanc  MRI pending   Podiatry: Plan for surgery tomorrow after 5pm pending MRI results. NPO after 9am after full breakfast  follow cultures

## 2021-08-02 NOTE — H&P (View-Only) (Signed)
 PODIATRY CONSULTATION  NAME Cody Cowan MRN [id number] DOB 09/04/1965 DOA 08/01/2021   Reason for consult: Osteomyelitis left foot Chief Complaint  Patient presents with   Foot Ulcer    Consulting physician: Sara-Maiz Thomas MD  History of present illness: 55 y.o. male PMHx DM2; uncontrolled, alcohol abuse, presenting for worsening infection left foot ulcers. Presents in ED waiting for bed placement. Nurse Technician present for Spanish interpreter. 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 He has had a chronic ulcer to the left foot for >6mos. Has noticed changes recently. Reports stepping on a nail. Xrays positive for osteomyelitis left fourth toe. Started on Vancomycin 1g and Zoxyn 3.375g. Tdap 0.5mL administered as well. Admission orders placed and podiatry consulted.   Past Medical History:  Diagnosis Date   Diabetes (HCC)        Latest Ref Rng & Units 08/02/2021    4:09 AM 08/01/2021    3:19 PM 03/02/2021    5:56 AM  CBC  WBC 4.0 - 10.5 K/uL 7.4   8.3   4.6    Hemoglobin 13.0 - 17.0 g/dL 11.0   12.5   11.8    Hematocrit 39.0 - 52.0 % 32.8   38.3   34.4    Platelets 150 - 400 K/uL 248   324   123         Latest Ref Rng & Units 08/02/2021    4:09 AM 08/01/2021    6:47 PM 08/01/2021    3:19 PM  BMP  Glucose 70 - 99 mg/dL 257   338   725    BUN 6 - 20 mg/dL 12   12   17    Creatinine 0.61 - 1.24 mg/dL 0.48   0.53   0.71    Sodium 135 - 145 mmol/L 132   133   128    Potassium 3.5 - 5.1 mmol/L 3.6   3.9   4.6    Chloride 98 - 111 mmol/L 98   101   91    CO2 22 - 32 mmol/L 25   27   27    Calcium 8.9 - 10.3 mg/dL 8.2   8.1   9.0          Focused Physical Exam: General: The patient is alert and oriented x3 in no acute distress.   Dermatology: Necrotic fourth toe LT foot with drainage. Also necrotic wounds noted to the plantar aspect of the foot secondary to severe flatfoot and bony prominance as well as reported history of stepping on a  nail. No significant malodor.   Vascular: DP and PT pulses are palpable. Skin warm to touch. Moderate edema noted to the forefoot.   Neurological: Light touch and protective threshold absent bilateral  Musculoskeletal Exam: Flatfoot deformity noted contributing to the ulcers of the plantar arch of the foot.  DG ANKLE COMPLETE LT 08/01/2021 IMPRESSION: 1. Soft tissue swelling . 2. No radiographic evidence for osteomyelitis. 3. Extensive vascular calcifications.  DG FOOT COMPLETE LT 08/01/2021 IMPRESSION: 1. Ulceration of the tip of the left fourth digit with bony erosion of the tuft of the left fourth distal phalanx, concerning for osteomyelitis. 2. Flatfoot deformity.  ASSESSMENT/PLAN OF CARE Suspected osteomyelitis LT 4th toe and possible rearfoot. 2. Cellulitis left foot 3. Uncontrolled DM2 w/ peripheral neuropathy  - MRI ordered LT foot. Prior MRI performed 01/27/2021 for same concern of OM. Compare to prior for changes concerning for osteomyelitis - Pulses are palpable and   maintained despite vascular calcifications on xray. Skin warm to touch.  - Continue IV abx as per admitting physician - Plan for surgery tomorrow after 5pm pending MRI results. NPO after 9am after full breakfast - Will follow   Thank you for the consult.    Eddison Searls M. Dejan Angert, DPM [facility]  Dr. Faithann Natal M. Gilmore List, DPM    [address]                                        [place], [place] [address]                Office [phone]  Fax [phone]     

## 2021-08-02 NOTE — Consult Note (Signed)
Pharmacy Antibiotic Note  Cody Cowan is a 56 y.o. male admitted on 08/01/2021 with  Foot infection .  Pharmacy has been consulted for pip/tazo and vancomycin dosing.  Plan: Will start pip/tazo 3.375 g q8H   Pt received vancomycin 1000 mg x 1 on 5/28. Will reload the patient with vancomycin 1250 mg x 1 and start maintenance dose of 750 mg q12H. Predicted AUC of 458. Goal AUC of 400-550. Scr 0.8, VD 0.72, IBW.  Plan to check vancomycin level after 4th or 5th dose.   Weight: 54.4 kg (120 lb)  Temp (24hrs), Avg:98.3 F (36.8 C), Min:97.9 F (36.6 C), Max:98.8 F (37.1 C)  Recent Labs  Lab 08/01/21 1519 08/01/21 1847 08/02/21 0409  WBC 8.3  --  7.4  CREATININE 0.71 0.53* 0.48*  LATICACIDVEN 1.9  --   --     Estimated Creatinine Clearance: 81.2 mL/min (A) (by C-G formula based on SCr of 0.48 mg/dL (L)).    No Known Allergies  Antimicrobials this admission: 5/28 vancomycin >>  5/28 pip/tazo >>   Dose adjustments this admission: None  Microbiology results: 5/28 BCx: pending   Thank you for allowing pharmacy to be a part of this patient's care.  Ronnald Ramp 08/02/2021 1:16 PM

## 2021-08-03 ENCOUNTER — Inpatient Hospital Stay: Payer: Self-pay | Admitting: Certified Registered"

## 2021-08-03 ENCOUNTER — Encounter
Admission: EM | Disposition: A | Discharge status: Home / Self Care | Payer: Self-pay | Source: Home / Self Care | Attending: Internal Medicine

## 2021-08-03 ENCOUNTER — Other Ambulatory Visit: Payer: Self-pay

## 2021-08-03 ENCOUNTER — Inpatient Hospital Stay: Payer: Self-pay

## 2021-08-03 DIAGNOSIS — M86672 Other chronic osteomyelitis, left ankle and foot: Secondary | ICD-10-CM

## 2021-08-03 HISTORY — PX: AMPUTATION TOE: SHX6595

## 2021-08-03 HISTORY — PX: INCISION AND DRAINAGE: SHX5863

## 2021-08-03 LAB — COMPREHENSIVE METABOLIC PANEL
ALT: 23 U/L (ref 0–44)
AST: 35 U/L (ref 15–41)
Albumin: 3.1 g/dL — ABNORMAL LOW (ref 3.5–5.0)
Alkaline Phosphatase: 133 U/L — ABNORMAL HIGH (ref 38–126)
Anion gap: 7 (ref 5–15)
BUN: 13 mg/dL (ref 6–20)
CO2: 28 mmol/L (ref 22–32)
Calcium: 9.1 mg/dL (ref 8.9–10.3)
Chloride: 97 mmol/L — ABNORMAL LOW (ref 98–111)
Creatinine, Ser: 0.63 mg/dL (ref 0.61–1.24)
GFR, Estimated: >60 mL/min (ref >60)
Glucose, Bld: 245 mg/dL — ABNORMAL HIGH (ref 70–99)
Potassium: 4.3 mmol/L (ref 3.5–5.1)
Sodium: 132 mmol/L — ABNORMAL LOW (ref 135–145)
Total Bilirubin: 0.8 mg/dL (ref 0.3–1.2)
Total Protein: 6.9 g/dL (ref 6.5–8.1)

## 2021-08-03 LAB — CBC
HCT: 35.7 % — ABNORMAL LOW (ref 39.0–52.0)
Hemoglobin: 12.2 g/dL — ABNORMAL LOW (ref 13.0–17.0)
MCH: 30.8 pg (ref 26.0–34.0)
MCHC: 34.2 g/dL (ref 30.0–36.0)
MCV: 90.2 fL (ref 80.0–100.0)
Platelets: 287 10*3/uL (ref 150–400)
RBC: 3.96 MIL/uL — ABNORMAL LOW (ref 4.22–5.81)
RDW: 11.3 % — ABNORMAL LOW (ref 11.5–15.5)
WBC: 8.6 10*3/uL (ref 4.0–10.5)
nRBC: 0 % (ref 0.0–0.2)

## 2021-08-03 LAB — GLUCOSE, CAPILLARY
Glucose-Capillary: 304 mg/dL — ABNORMAL HIGH (ref 70–99)
Glucose-Capillary: 274 mg/dL — ABNORMAL HIGH (ref 70–99)
Glucose-Capillary: 199 mg/dL — ABNORMAL HIGH (ref 70–99)
Glucose-Capillary: 144 mg/dL — ABNORMAL HIGH (ref 70–99)
Glucose-Capillary: 186 mg/dL — ABNORMAL HIGH (ref 70–99)

## 2021-08-03 IMAGING — DX DG FOOT COMPLETE 3+V*L*
3 series · 3 of 3 positions shown · non-contrast
Comparison: [DATE]

CLINICAL DATA: Fourth digit amputation, postop

EXAM:
LEFT FOOT - COMPLETE 3+ VIEW

[foot ap]
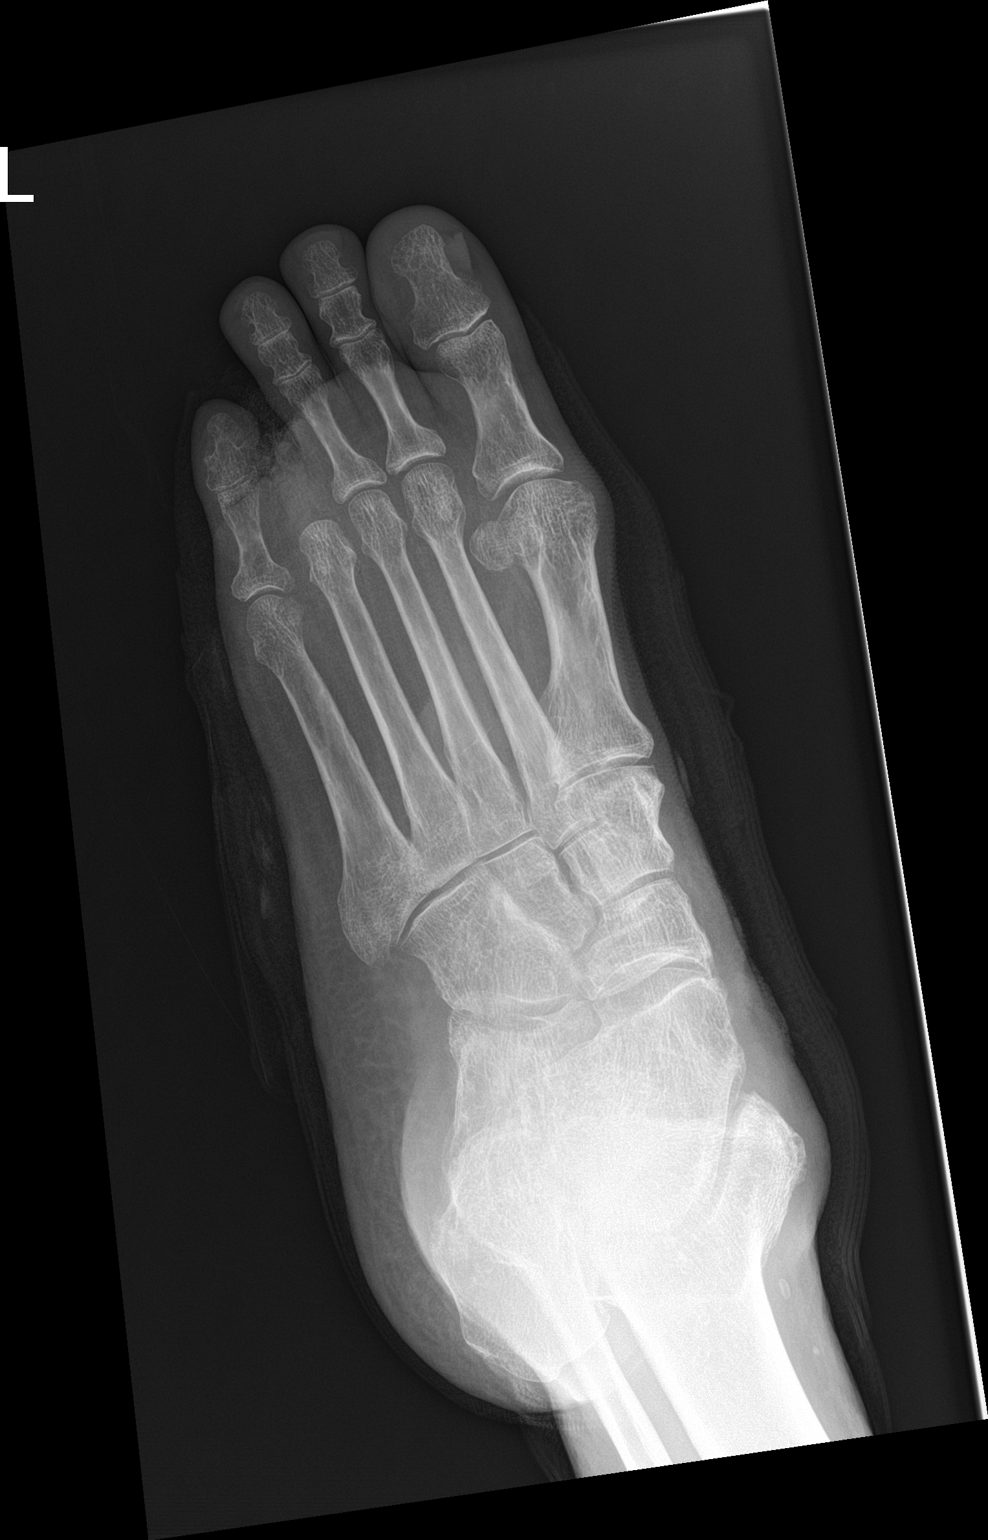

[foot obl]
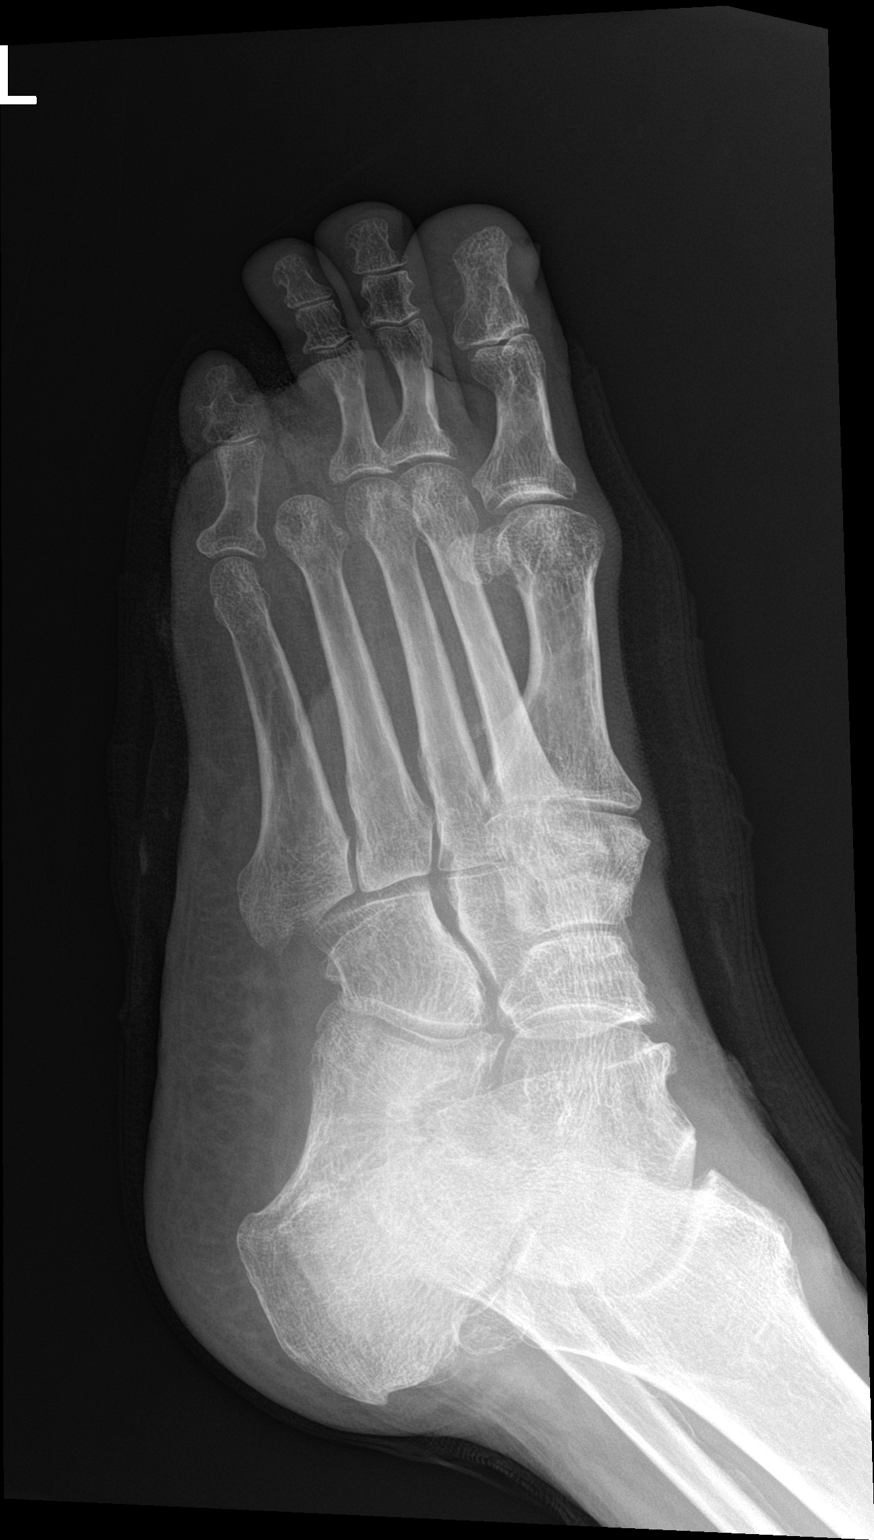

[foot lat]
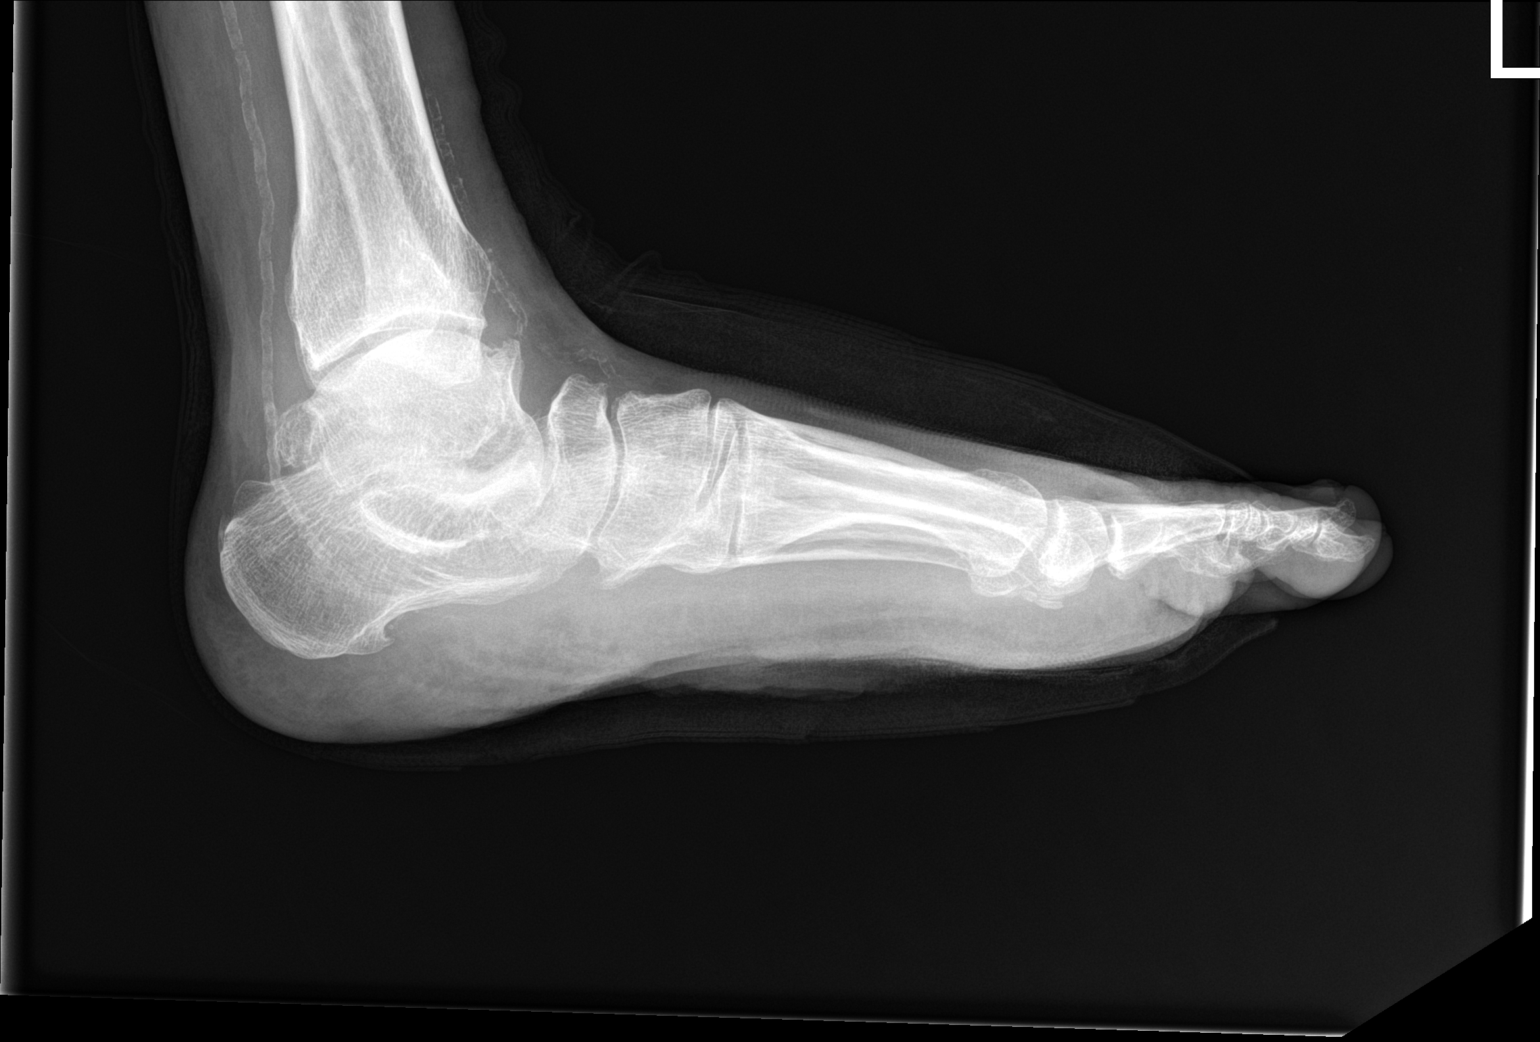

[3 of 3 positions shown; findings below may reference images not displayed]

FINDINGS: Frontal, oblique, lateral views of the left foot are obtained.
Interval amputation of the fourth digit at the level of the
metatarsophalangeal joint. Stable osteoarthritis most pronounced
within the ankle and hindfoot. Diffuse subcutaneous edema.
Atherosclerosis.
IMPRESSION: 1. Left fourth digit amputation.
2. Stable osteoarthritis.

## 2021-08-03 SURGERY — AMPUTATION, TOE
Anesthesia: General | Site: Toe | Laterality: Left

## 2021-08-03 MED ORDER — OXYCODONE HCL 5 MG PO TABS: 5.0000 mg | ORAL_TABLET | Freq: Once | ORAL | Status: DC | PRN

## 2021-08-03 MED ORDER — PHENYLEPHRINE 80 MCG/ML (10ML) SYRINGE FOR IV PUSH (FOR BLOOD PRESSURE SUPPORT)
PREFILLED_SYRINGE | INTRAVENOUS | Status: DC | PRN
  Administered 2021-08-03: 80 ug via INTRAVENOUS
  Administered 2021-08-03: 160 ug via INTRAVENOUS

## 2021-08-03 MED ORDER — PROPOFOL 500 MG/50ML IV EMUL
INTRAVENOUS | Status: DC | PRN
  Administered 2021-08-03: 150 ug/kg/min via INTRAVENOUS

## 2021-08-03 MED ORDER — BUPIVACAINE HCL (PF) 0.5 % IJ SOLN
INTRAMUSCULAR | Status: AC
  Filled 2021-08-03: qty 30

## 2021-08-03 MED ORDER — FENTANYL CITRATE (PF) 100 MCG/2ML IJ SOLN
INTRAMUSCULAR | Status: AC
  Filled 2021-08-03: qty 2

## 2021-08-03 MED ORDER — DEXMEDETOMIDINE (PRECEDEX) IN NS 20 MCG/5ML (4 MCG/ML) IV SYRINGE
PREFILLED_SYRINGE | INTRAVENOUS | Status: DC | PRN
  Administered 2021-08-03: 8 ug via INTRAVENOUS
  Administered 2021-08-03: 4 ug via INTRAVENOUS

## 2021-08-03 MED ORDER — FENTANYL CITRATE (PF) 100 MCG/2ML IJ SOLN
INTRAMUSCULAR | Status: DC | PRN
  Administered 2021-08-03: 25 ug via INTRAVENOUS

## 2021-08-03 MED ORDER — PROPOFOL 10 MG/ML IV BOLUS
INTRAVENOUS | Status: AC
  Filled 2021-08-03: qty 40

## 2021-08-03 MED ORDER — PROPOFOL 10 MG/ML IV BOLUS
INTRAVENOUS | Status: AC
  Filled 2021-08-03: qty 20

## 2021-08-03 MED ORDER — PROPOFOL 10 MG/ML IV BOLUS
INTRAVENOUS | Status: DC | PRN
  Administered 2021-08-03: 20 mg via INTRAVENOUS

## 2021-08-03 MED ORDER — LIDOCAINE HCL (PF) 1 % IJ SOLN
INTRAMUSCULAR | Status: AC
  Filled 2021-08-03: qty 30

## 2021-08-03 MED ORDER — MIDAZOLAM HCL 2 MG/2ML IJ SOLN
INTRAMUSCULAR | Status: AC
  Filled 2021-08-03: qty 2

## 2021-08-03 MED ORDER — DROPERIDOL 2.5 MG/ML IJ SOLN: 0.6250 mg | Freq: Once | INTRAMUSCULAR | Status: DC | PRN

## 2021-08-03 MED ORDER — NEPRO/CARBSTEADY PO LIQD
237.0000 mL | Freq: Two times a day (BID) | ORAL | Status: DC
  Administered 2021-08-04 – 2021-08-09 (×9): 237 mL via ORAL

## 2021-08-03 MED ORDER — SODIUM CHLORIDE 0.9 % IV SOLN: INTRAVENOUS | Status: DC | PRN

## 2021-08-03 MED ORDER — ACETAMINOPHEN 10 MG/ML IV SOLN: 1000.0000 mg | Freq: Once | INTRAVENOUS | Status: DC | PRN

## 2021-08-03 MED ORDER — CHLORHEXIDINE GLUCONATE 0.12 % MT SOLN
OROMUCOSAL | Status: AC
  Administered 2021-08-03: 15 mL via OROMUCOSAL
  Filled 2021-08-03: qty 15

## 2021-08-03 MED ORDER — OCUVITE-LUTEIN PO CAPS
1.0000 | ORAL_CAPSULE | Freq: Every day | ORAL | Status: DC
  Administered 2021-08-04 – 2021-08-09 (×4): 1 via ORAL
  Filled 2021-08-03 (×7): qty 1

## 2021-08-03 MED ORDER — ONDANSETRON HCL 4 MG/2ML IJ SOLN
INTRAMUSCULAR | Status: DC | PRN
  Administered 2021-08-03: 4 mg via INTRAVENOUS

## 2021-08-03 MED ORDER — 0.9 % SODIUM CHLORIDE (POUR BTL) OPTIME
TOPICAL | Status: DC | PRN
  Administered 2021-08-03: 300 mL

## 2021-08-03 MED ORDER — FENTANYL CITRATE (PF) 100 MCG/2ML IJ SOLN: 25.0000 ug | INTRAMUSCULAR | Status: DC | PRN

## 2021-08-03 MED ORDER — PROMETHAZINE HCL 25 MG/ML IJ SOLN: 6.2500 mg | INTRAMUSCULAR | Status: DC | PRN

## 2021-08-03 MED ORDER — POVIDONE-IODINE 10 % EX SOLN
CUTANEOUS | Status: DC | PRN
  Administered 2021-08-03: 1 via TOPICAL

## 2021-08-03 MED ORDER — LIDOCAINE HCL (PF) 2 % IJ SOLN
INTRAMUSCULAR | Status: AC
  Filled 2021-08-03: qty 5

## 2021-08-03 MED ORDER — OXYCODONE HCL 5 MG/5ML PO SOLN: 5.0000 mg | Freq: Once | ORAL | Status: DC | PRN

## 2021-08-03 MED ORDER — LIDOCAINE HCL 1 % IJ SOLN
INTRAMUSCULAR | Status: DC | PRN
  Administered 2021-08-03: 20 mL via SURGICAL_CAVITY

## 2021-08-03 MED ORDER — CHLORHEXIDINE GLUCONATE 0.12 % MT SOLN: 15.0000 mL | Freq: Once | OROMUCOSAL | Status: AC

## 2021-08-03 MED ORDER — ONDANSETRON HCL 4 MG/2ML IJ SOLN
INTRAMUSCULAR | Status: AC
  Filled 2021-08-03: qty 2

## 2021-08-03 MED ORDER — MIDAZOLAM HCL 2 MG/2ML IJ SOLN
INTRAMUSCULAR | Status: DC | PRN
  Administered 2021-08-03 (×2): 1 mg via INTRAVENOUS

## 2021-08-03 SURGICAL SUPPLY — 75 items
BLADE MED AGGRESSIVE (BLADE) ×3 IMPLANT
BLADE OSC/SAGITTAL MD 5.5X18 (BLADE) IMPLANT
BLADE SURG 15 STRL LF DISP TIS (BLADE) IMPLANT
BLADE SURG 15 STRL SS (BLADE)
BLADE SURG MINI STRL (BLADE) IMPLANT
BNDG COHESIVE 4X5 TAN ST LF (GAUZE/BANDAGES/DRESSINGS) ×3 IMPLANT
BNDG COHESIVE 6X5 TAN ST LF (GAUZE/BANDAGES/DRESSINGS) ×3 IMPLANT
BNDG CONFORM 2 STRL LF (GAUZE/BANDAGES/DRESSINGS) ×3 IMPLANT
BNDG CONFORM 3 STRL LF (GAUZE/BANDAGES/DRESSINGS) ×3 IMPLANT
BNDG ELASTIC 4X5.8 VLCR STR LF (GAUZE/BANDAGES/DRESSINGS) ×3 IMPLANT
BNDG ESMARK 4X12 TAN STRL LF (GAUZE/BANDAGES/DRESSINGS) ×3 IMPLANT
BNDG GAUZE ELAST 4 BULKY (GAUZE/BANDAGES/DRESSINGS) ×3 IMPLANT
CNTNR SPEC 2.5X3XGRAD LEK (MISCELLANEOUS) ×2
CONT SPEC 4OZ STER OR WHT (MISCELLANEOUS) ×1
CONTAINER SPEC 2.5X3XGRAD LEK (MISCELLANEOUS) ×2 IMPLANT
CUFF TOURN SGL QUICK 12 (TOURNIQUET CUFF) IMPLANT
CUFF TOURN SGL QUICK 18X4 (TOURNIQUET CUFF) IMPLANT
CUFF TOURN SGL QUICK 24 (TOURNIQUET CUFF)
CUFF TRNQT CYL 24X4X16.5-23 (TOURNIQUET CUFF) IMPLANT
DRAPE FLUOR MINI C-ARM 54X84 (DRAPES) IMPLANT
DRSG EMULSION OIL 3X8 NADH (GAUZE/BANDAGES/DRESSINGS) ×3 IMPLANT
DURAPREP 26ML APPLICATOR (WOUND CARE) ×3 IMPLANT
ELECT REM PT RETURN 9FT ADLT (ELECTROSURGICAL) ×3
ELECTRODE REM PT RTRN 9FT ADLT (ELECTROSURGICAL) ×2 IMPLANT
GAUZE PACKING 1/4 X5 YD (GAUZE/BANDAGES/DRESSINGS) IMPLANT
GAUZE PACKING IODOFORM 1X5 (PACKING) IMPLANT
GAUZE SPONGE 4X4 12PLY STRL (GAUZE/BANDAGES/DRESSINGS) ×6 IMPLANT
GAUZE XEROFORM 1X8 LF (GAUZE/BANDAGES/DRESSINGS) ×3 IMPLANT
GLOVE BIO SURGEON STRL SZ8 (GLOVE) ×3 IMPLANT
GLOVE BIOGEL PI IND STRL 8 (GLOVE) ×2 IMPLANT
GLOVE BIOGEL PI INDICATOR 8 (GLOVE) ×1
GLOVE SURG ENC TEXT LTX SZ8 (GLOVE) ×3 IMPLANT
GOWN STRL REUS W/ TWL LRG LVL3 (GOWN DISPOSABLE) ×4 IMPLANT
GOWN STRL REUS W/ TWL XL LVL3 (GOWN DISPOSABLE) ×2 IMPLANT
GOWN STRL REUS W/TWL LRG LVL3 (GOWN DISPOSABLE) ×2
GOWN STRL REUS W/TWL MED LVL3 (GOWN DISPOSABLE) ×3 IMPLANT
GOWN STRL REUS W/TWL XL LVL3 (GOWN DISPOSABLE) ×1
HANDPIECE VERSAJET DEBRIDEMENT (MISCELLANEOUS) IMPLANT
IV NS 1000ML (IV SOLUTION) ×1
IV NS 1000ML BAXH (IV SOLUTION) ×2 IMPLANT
IV NS IRRIG 3000ML ARTHROMATIC (IV SOLUTION) IMPLANT
KIT TURNOVER KIT A (KITS) ×3 IMPLANT
LABEL OR SOLS (LABEL) ×3 IMPLANT
MANIFOLD NEPTUNE II (INSTRUMENTS) ×3 IMPLANT
MATRIX WOUND 3-LAYER 5X5 (Tissue) ×1 IMPLANT
MICROMATRIX 500MG (Tissue) ×3 IMPLANT
NDL FILTER BLUNT 18X1 1/2 (NEEDLE) ×2 IMPLANT
NDL HYPO 25X1 1.5 SAFETY (NEEDLE) ×4 IMPLANT
NEEDLE FILTER BLUNT 18X 1/2SAF (NEEDLE) ×1
NEEDLE FILTER BLUNT 18X1 1/2 (NEEDLE) ×2 IMPLANT
NEEDLE HYPO 25X1 1.5 SAFETY (NEEDLE) ×6 IMPLANT
NS IRRIG 500ML POUR BTL (IV SOLUTION) ×3 IMPLANT
PACK EXTREMITY ARMC (MISCELLANEOUS) ×3 IMPLANT
PAD ABD DERMACEA PRESS 5X9 (GAUZE/BANDAGES/DRESSINGS) ×3 IMPLANT
PULSAVAC PLUS IRRIG FAN TIP (DISPOSABLE)
SOL PREP PVP 2OZ (MISCELLANEOUS) ×3
SOLUTION PARTIC MCRMTRX 500MG (Tissue) IMPLANT
SOLUTION PREP PVP 2OZ (MISCELLANEOUS) ×2 IMPLANT
STAPLER SKIN PROX 35W (STAPLE) IMPLANT
STOCKINETTE IMPERVIOUS 9X36 MD (GAUZE/BANDAGES/DRESSINGS) ×3 IMPLANT
STOCKINETTE STRL 6IN 960660 (GAUZE/BANDAGES/DRESSINGS) ×3 IMPLANT
SUT ETHILON 2 0 FS 18 (SUTURE) IMPLANT
SUT ETHILON 3-0 FS-10 30 BLK (SUTURE) ×6
SUT ETHILON 4-0 (SUTURE)
SUT ETHILON 4-0 FS2 18XMFL BLK (SUTURE)
SUT PROLENE 3 0 PS 2 (SUTURE) IMPLANT
SUT VIC AB 3-0 SH 27 (SUTURE) ×1
SUT VIC AB 3-0 SH 27X BRD (SUTURE) ×2 IMPLANT
SUT VIC AB 4-0 FS2 27 (SUTURE) IMPLANT
SUTURE EHLN 3-0 FS-10 30 BLK (SUTURE) ×4 IMPLANT
SUTURE ETHLN 4-0 FS2 18XMF BLK (SUTURE) IMPLANT
SWAB CULTURE AMIES ANAERIB BLU (MISCELLANEOUS) IMPLANT
SYR 10ML LL (SYRINGE) ×6 IMPLANT
TIP FAN IRRIG PULSAVAC PLUS (DISPOSABLE) IMPLANT
WATER STERILE IRR 500ML POUR (IV SOLUTION) ×3 IMPLANT

## 2021-08-03 NOTE — Interval H&P Note (Signed)
History and Physical Interval Note:  08/03/2021 4:51 PM  Cody Cowan  has presented today for surgery, with the diagnosis of Osteomyelitis.  The various methods of treatment have been discussed with the patient and family. After consideration of risks, benefits and other options for treatment, the patient has consented to  Procedure(s): AMPUTATION TOE 4th digit left toe (Left) INCISION AND DRAINAGE LEFT FOOT (Left) as a surgical intervention.  The patient's history has been reviewed, patient examined, no change in status, stable for surgery.  I have reviewed the patient's chart and labs.  Questions were answered to the patient's satisfaction.     Edrick Kins

## 2021-08-03 NOTE — Progress Notes (Signed)
PROGRESS NOTE    Cody Cowan  WUJ:[id number] DOB: 10-27-1965  DOA: 08/01/2021 Date of Service: 08/03/21 PCP: Patient, No Pcp Per (Inactive)     Brief Narrative / Hospital Course:  Cody Cowan is a 56 y.o. male with medical  history significant for alcohol abuse, pancreatitis, DM2 who presents to ED 08/01/21 with complaint of 4th toe infection- redness and drainage as well as black discoloration, plus chronic ulcer on left foot x 6 months but noted no drainage or changes recently but has intermittent pain. He also endorses stepping on a nail and notes puncture wound above his chronic ulcer.  Started on vancomycin, Zosyn.  Planning for MRI 08/03/2021 and likely surgery that afternoon/evening   Consultants:  Podiatry   Procedures: None     Subjective: Patient reports feeling well, pain controlled, no concerns, no questions.  Awaiting MRI     ASSESSMENT & PLAN:   Principal Problem:   Osteomyelitis (HCC) Active Problems:   Uncontrolled type 2 diabetes mellitus with hyperglycemia, without long-term current use of insulin (HCC)   EtOH dependence (HCC)    Osteomyelitis (HCC) Tachycardia but NO fever, tachypnea, leukocytosis --> tachycardia resolved Today 08/03/21 is Day 3 abx: Zosyn and Vanc MRI pending  Podiatry: Plan for surgery 05/30 after 5pm pending MRI results. NPO after 9am after full breakfast follow cultures   Uncontrolled type 2 diabetes mellitus with hyperglycemia, without long-term current use of insulin (HCC) TOC may be able to help w/ Rx resources on discharge ISS while inpatient  Last A1C 10.2  EtOH dependence (HCC) patient states drinks occasionally , last drink 6 days ago he denies history of withdrawals CIWA protocol     DVT prophylaxis: lovenox Code Status: FULL Family Communication: none at this eim Disposition Plan / TOC needs: Initially admitted to progressive given tachycardia and significant elevated Glc but  these were treated/resolved. Now Med/Surg appropriate. May need SNF/HH pending surgery  Barriers to discharge / significant pending items: surgery, continued IV abx awaiting culture              Objective: Vitals:   08/02/21 2022 08/03/21 0442 08/03/21 0500 08/03/21 0819  BP: (!) 172/98 128/82  97/72  Pulse: 91 88  97  Resp: 16 16    Temp: 99.5 F (37.5 C) 99.2 F (37.3 C)  97.8 F (36.6 C)  TempSrc:      SpO2: 100% 100%  100%  Weight:   52.4 kg     Intake/Output Summary (Last 24 hours) at 08/03/2021 1343 Last data filed at 08/03/2021 0800 Gross per 24 hour  Intake 501.12 ml  Output 1450 ml  Net -948.88 ml   Filed Weights   08/01/21 1518 08/03/21 0500  Weight: 54.4 kg 52.4 kg    Examination:  Constitutional:  VS as above General Appearance: alert, well-developed, well-nourished, NAD Neck: No masses, trachea midline Respiratory: Normal respiratory effort Breath sounds normal, no wheeze/rhonchi/rales Cardiovascular: S1/S2 normal, no murmur/rub/gallop auscultated No lower extremity edema Gastrointestinal: Nontender, no masses Neurological: No cranial nerve deficit on limited exam Psychiatric: Normal mood and affect       Scheduled Medications:   enoxaparin (LOVENOX) injection  40 mg Subcutaneous Q24H   insulin aspart  0-15 Units Subcutaneous TID WC   insulin aspart  3 Units Subcutaneous TID WC   insulin glargine-yfgn  30 Units Subcutaneous QHS   sodium chloride flush  3 mL Intravenous Q12H    Continuous Infusions:  piperacillin-tazobactam (ZOSYN)  IV 3.375 g (08/03/21 1337)  vancomycin 750 mg (08/03/21 1015)    PRN Medications:  acetaminophen **OR** acetaminophen, albuterol, dextrose, HYDROcodone-acetaminophen, morphine injection, ondansetron **OR** ondansetron (ZOFRAN) IV  Antimicrobials:  Anti-infectives (From admission, onward)    Start     Dose/Rate Route Frequency Ordered Stop   08/02/21 2200  vancomycin (VANCOREADY) IVPB 750  mg/150 mL       See Hyperspace for full Linked Orders Report.   750 mg 150 mL/hr over 60 Minutes Intravenous Every 12 hours 08/02/21 1308     08/02/21 1315  piperacillin-tazobactam (ZOSYN) IVPB 3.375 g        3.375 g 12.5 mL/hr over 240 Minutes Intravenous Every 8 hours 08/02/21 1308     08/02/21 1315  vancomycin (VANCOREADY) IVPB 1250 mg/250 mL       See Hyperspace for full Linked Orders Report.   1,250 mg 166.7 mL/hr over 90 Minutes Intravenous  Once 08/02/21 1308 08/03/21 0805   08/01/21 1630  vancomycin (VANCOCIN) IVPB 1000 mg/200 mL premix        1,000 mg 200 mL/hr over 60 Minutes Intravenous  Once 08/01/21 1616 08/01/21 1752   08/01/21 1630  piperacillin-tazobactam (ZOSYN) IVPB 3.375 g        3.375 g 100 mL/hr over 30 Minutes Intravenous  Once 08/01/21 1616 08/01/21 1752       Data Reviewed: I have personally reviewed following labs and imaging studies  CBC: Recent Labs  Lab 08/01/21 1519 08/02/21 0409 08/03/21 0409  WBC 8.3 7.4 8.6  NEUTROABS 6.5  --   --   HGB 12.5* 11.0* 12.2*  HCT 38.3* 32.8* 35.7*  MCV 93.4 91.4 90.2  PLT 324 248 287   Basic Metabolic Panel: Recent Labs  Lab 08/01/21 1519 08/01/21 1847 08/02/21 0409 08/03/21 0409  NA 128* 133* 132* 132*  K 4.6 3.9 3.6 4.3  CL 91* 101 98 97*  CO2 27 27 25 28   GLUCOSE 725* 338* 257* 245*  BUN 17 12 12 13   CREATININE 0.71 0.53* 0.48* 0.63  CALCIUM 9.0 8.1* 8.2* 9.1   GFR: Estimated Creatinine Clearance: 78.2 mL/min (by C-G formula based on SCr of 0.63 mg/dL). Liver Function Tests: Recent Labs  Lab 08/01/21 1519 08/01/21 1847 08/02/21 0409 08/03/21 0409  AST 33 28 22 35  ALT 22 19 18 23   ALKPHOS 168* 170* 139* 133*  BILITOT 0.9 0.8 0.6 0.8  PROT 7.8 6.6 6.2* 6.9  ALBUMIN 3.6 3.1* 2.9* 3.1*   No results for input(s): LIPASE, AMYLASE in the last 168 hours. No results for input(s): AMMONIA in the last 168 hours. Coagulation Profile: No results for input(s): INR, PROTIME in the last 168  hours. Cardiac Enzymes: No results for input(s): CKTOTAL, CKMB, CKMBINDEX, TROPONINI in the last 168 hours. BNP (last 3 results) No results for input(s): PROBNP in the last 8760 hours. HbA1C: Recent Labs    08/01/21 1847  HGBA1C 14.8*   CBG: Recent Labs  Lab 08/02/21 1303 08/02/21 1659 08/02/21 2105 08/03/21 0804 08/03/21 1157  GLUCAP 304* 343* 171* 274* 199*   Lipid Profile: No results for input(s): CHOL, HDL, LDLCALC, TRIG, CHOLHDL, LDLDIRECT in the last 72 hours. Thyroid Function Tests: Recent Labs    08/01/21 1847  TSH 11.550*   Anemia Panel: No results for input(s): VITAMINB12, FOLATE, FERRITIN, TIBC, IRON, RETICCTPCT in the last 72 hours. Urine analysis:    Component Value Date/Time   COLORURINE STRAW (A) 08/01/2021 1519   APPEARANCEUR CLEAR (A) 08/01/2021 1519   LABSPEC 1.026 08/01/2021 1519  PHURINE 7.0 08/01/2021 1519   GLUCOSEU >=500 (A) 08/01/2021 1519   HGBUR NEGATIVE 08/01/2021 1519   BILIRUBINUR NEGATIVE 08/01/2021 1519   KETONESUR NEGATIVE 08/01/2021 1519   PROTEINUR NEGATIVE 08/01/2021 1519   NITRITE NEGATIVE 08/01/2021 1519   LEUKOCYTESUR NEGATIVE 08/01/2021 1519   Sepsis Labs: @LABRCNTIP (procalcitonin:4,lacticidven:4)  Recent Results (from the past 240 hour(s))  Gastrointestinal Panel by PCR , Stool     Status: None   Collection Time: 08/01/21  3:19 PM   Specimen: Stool  Result Value Ref Range Status   Campylobacter species NOT DETECTED NOT DETECTED Final   Plesimonas shigelloides NOT DETECTED NOT DETECTED Final   Salmonella species NOT DETECTED NOT DETECTED Final   Yersinia enterocolitica NOT DETECTED NOT DETECTED Final   Vibrio species NOT DETECTED NOT DETECTED Final   Vibrio cholerae NOT DETECTED NOT DETECTED Final   Enteroaggregative E coli (EAEC) NOT DETECTED NOT DETECTED Final   Enteropathogenic E coli (EPEC) NOT DETECTED NOT DETECTED Final   Enterotoxigenic E coli (ETEC) NOT DETECTED NOT DETECTED Final   Shiga like toxin  producing E coli (STEC) NOT DETECTED NOT DETECTED Final   Shigella/Enteroinvasive E coli (EIEC) NOT DETECTED NOT DETECTED Final   Cryptosporidium NOT DETECTED NOT DETECTED Final   Cyclospora cayetanensis NOT DETECTED NOT DETECTED Final   Entamoeba histolytica NOT DETECTED NOT DETECTED Final   Giardia lamblia NOT DETECTED NOT DETECTED Final   Adenovirus F40/41 NOT DETECTED NOT DETECTED Final   Astrovirus NOT DETECTED NOT DETECTED Final   Norovirus GI/GII NOT DETECTED NOT DETECTED Final   Rotavirus A NOT DETECTED NOT DETECTED Final   Sapovirus (I, II, IV, and V) NOT DETECTED NOT DETECTED Final    Comment: Performed at [facility], [address] [place], [place]  C Difficile Quick Screen w PCR reflex     Status: None   Collection Time: 08/01/21  3:19 PM   Specimen: Stool  Result Value Ref Range Status   C Diff antigen NEGATIVE NEGATIVE Final   C Diff toxin NEGATIVE NEGATIVE Final   C Diff interpretation No C. difficile detected.  Final    Comment: Performed at [facility], [address] [place], [place]  Culture, blood (single)     Status: None (Preliminary result)   Collection Time: 08/01/21  3:19 PM   Specimen: BLOOD  Result Value Ref Range Status   Specimen Description BLOOD [facility]  Final   Special Requests   Final    BOTTLES DRAWN AEROBIC AND ANAEROBIC Blood Culture adequate volume   Culture   Final    NO GROWTH 1 DAY Performed at [facility], [address] [place], [place]    Report Status PENDING  Incomplete         Radiology Studies last 96 hours: DG Ankle Complete Left  Result Date: 08/01/2021 CLINICAL DATA:  Evaluate for osteomyelitis. EXAM: LEFT ANKLE COMPLETE - 3+ VIEW COMPARISON:  MR 01/27/2021. FINDINGS: Mild diffuse soft tissue swelling. Extensive vascular calcifications are identified. No signs of acute fracture or dislocation. No suspicious bone erosions identified. IMPRESSION: 1. Soft tissue  swelling . 2. No radiographic evidence for osteomyelitis. 3. Extensive vascular calcifications. Electronically Signed   By: 01/29/2021 M.D.   On: 08/01/2021 16:42   MR FOOT LEFT WO CONTRAST  Result Date: 08/02/2021 CLINICAL DATA:  Foot swelling, diabetic, osteomyelitis suspected. EXAM: MRI OF THE LEFT FOOT WITHOUT CONTRAST TECHNIQUE: Multiplanar, multisequence MR imaging of the left forefoot was performed. No  intravenous contrast was administered. COMPARISON:  Radiographs dated Aug 01, 2021 FINDINGS: Bones/Joint/Cartilage Bone marrow edema of the proximal and distal phalanges of the fourth digit. Subchondral cystic changes of the first metatarsophalangeal joint. Small cyst in the distal aspect of the proximal phalanx of the second digit. Ligaments Lisfranc ligament is intact. Collateral ligaments appear maintained, evaluation is limited due to motion. Muscles and Tendons Grease intramuscular signal of the plantar muscles concerning for diabetic myopathy/myositis. No fluid collection or abscess. Soft tissues Marked subcutaneous soft tissue edema and inflammatory changes about the fourth digit and a skin wound over the dorsal aspect of the digit. IMPRESSION: 1. Bone marrow edema of the proximal and distal phalanges of the fourth digit with surrounding inflammatory changes and deep skin wound concerning for acute osteomyelitis. 2. Degenerative changes prominent at the first metatarsophalangeal joint and likely a small cyst in the distal aspect of the proximal phalanx of the second digit. 3. Subcutaneous soft tissue edema and inflammatory changes about the fourth digit. Diabetic myopathy/myositis of the plantar muscles. No drainable fluid collection or abscess. Electronically Signed   By: Larose Hires D.O.   On: 08/02/2021 12:38   MR ANKLE LEFT WO CONTRAST  Result Date: 08/02/2021 CLINICAL DATA:  Foot swelling, diabetic, osteomyelitis suspected, xray done EXAM: MRI OF THE LEFT ANKLE WITHOUT CONTRAST  TECHNIQUE: Multiplanar, multisequence MR imaging of the ankle was performed. No intravenous contrast was administered. COMPARISON:  X-ray 08/01/2021. FINDINGS: TENDONS Peroneal: Severe tendinosis of the peroneus longus and brevis tendons with complex tearing of the inframalleolar aspect of the peroneus brevis tendon. Posteromedial: Distal tibialis posterior tendinosis. The flexor hallucis longus and flexor digitorum longus tendons are intact and normally positioned. Anterior: Tibialis anterior, extensor hallucis longus, and extensor digitorum longus tendons are intact and normally positioned. Achilles: Intact. Plantar Fascia: Intact. LIGAMENTS Lateral: Intact tibiofibular ligaments. The anterior and posterior talofibular ligaments and calcaneofibular ligament are thickened and heterogeneous, but intact. Medial: Heterogeneous appearance of the deltoid ligament. The visualized portions of the spring ligament appear intact. CARTILAGE AND BONES Ankle Joint: Trace tibiotalar joint effusion. Cartilage thinning with full-thickness fissuring along the anterior aspect of the lateral talar shoulder. Small marginal osteophytes. Large os trigonum preserved marrow signal. Subtalar Joints/Sinus Tarsi: No cartilage defect. No effusion. Loss of the normal anatomic fat signal within the sinus tarsi. Bones: Pes planovalgus alignment. Patchy marrow edema within the body of the calcaneus. No erosion. Preserved fatty T1 marrow signal within the hindfoot. Prominent dorsal spurring of the talar head with bone marrow edema. Small talonavicular joint effusion. No acute fracture or dislocation. Other: Mild soft tissue edema at the lateral aspect of the hindfoot. No fluid collection. IMPRESSION: 1. No convincing evidence of osteomyelitis involving the left ankle or hindfoot. 2. Severe tendinosis of the peroneus longus and brevis tendons with complex tearing of the peroneus brevis tendon. 3. Patchy bone marrow edema within the calcaneal body,  which may be stress related or reactive secondary to adjacent peroneal tendinopathy. 4. Prominent dorsal spurring of the talar head with associated bone marrow edema, nonspecific and may be reactive/degenerative. 5. Loss of the normal anatomic fat signal within the sinus tarsi. Correlate for sinus tarsi syndrome. 6. Distal tibialis posterior tendinosis. 7. Pes planovalgus alignment. Electronically Signed   By: Duanne Guess D.O.   On: 08/02/2021 12:46   DG Foot Complete Left  Result Date: 08/01/2021 CLINICAL DATA:  Fourth toe ulceration, oozing, osteomyelitis EXAM: LEFT FOOT - COMPLETE 3+ VIEW COMPARISON:  01/27/2021 FINDINGS: No fracture or dislocation  of the left foot. Ulceration of the tip of the left fourth digit with bony erosion of the tuft of the left fourth distal phalanx. Flatfoot deformity. Vascular calcinosis. IMPRESSION: 1. Ulceration of the tip of the left fourth digit with bony erosion of the tuft of the left fourth distal phalanx, concerning for osteomyelitis. 2. Flatfoot deformity. Electronically Signed   By: Jearld LeschAlex D Bibbey M.D.   On: 08/01/2021 16:42            LOS: 2 days        Sunnie NielsenNatalie Terril Chestnut, DO [facility] 08/03/2021, 1:43 PM   Staff may message me via secure chat in Epic  but this may not receive immediate response,  please page for urgent matters!  If 7PM-7AM, please contact night-coverage [email or web address]  Dictation software was used to generate the above note. Typos may occur and escape review, as with typed/written notes. Please contact Dr Lyn HollingsheadAlexander directly for clarity if needed.

## 2021-08-03 NOTE — Progress Notes (Signed)
Inpatient Diabetes Program Recommendations  AACE/ADA: New Consensus Statement on Inpatient Glycemic Control (2015)  Target Ranges:  Prepandial:   less than 140 mg/dL      Peak postprandial:   less than 180 mg/dL (1-2 hours)      Critically ill patients:  140 - 180 mg/dL   Lab Results  Component Value Date   GLUCAP 274 (H) 08/03/2021   HGBA1C 14.8 (H) 08/01/2021    Review of Glycemic Control  Admit with: Osteomyelitis L 4th Toe/ Hyperglycemia   History: DM, ETOH Abuse, Pancreatitis   Home DM Meds: Humalog 75/25 Insulin 30 units BID                             Metformin 500 mg BID   Current Orders: Novolog Moderate Correction Scale/ SSI (0-15 units) TID AC                            Novolog 3 units TID with meals                            Semglee 30 units QHS  Inpatient Diabetes Program Recommendations:   Spoke with patient using AMN # [id number] and discussed diabetes management and insulin administration. Patient states he followed up @ one of the clinics suggested to him on last admission but unable to receive insulin due to no insurance. Patient states he then went to [facility] but unsure what insulin to purchase. Patient states he will probably not be able to afford the [employer] insulin after discharge due to not working for a while after having toe amputated today. Transition of care consulted regarding PCP and medication assistance. Patient shared that his sister provides him some transportation as needed.  Patient has 2 glucose meters @ home but needs strips. States the meter is the ones given to him while in the hospital previously.  Thank you, Billy Fischer. Delenn Ahn, RN, MSN, CDE  Diabetes Coordinator Inpatient Glycemic Control Team Team Pager [phone] (8am-5pm) 08/03/2021 11:47 AM

## 2021-08-03 NOTE — Transfer of Care (Signed)
Immediate Anesthesia Transfer of Care Note  Patient: Cody Cowan  Procedure(s) Performed: AMPUTATION TOE 4th digit left toe (Left: Toe) INCISION AND DRAINAGE LEFT FOOT (Left)  Patient Location: PACU  Anesthesia Type:General  Level of Consciousness: awake  Airway & Oxygen Therapy: Patient Spontanous Breathing and Patient connected to face mask oxygen  Post-op Assessment: Report given to RN and Post -op Vital signs reviewed and stable  Post vital signs: Reviewed and stable  Last Vitals:  Vitals Value Taken Time  BP 95/69 08/03/21 1854  Temp 37.2 C 08/03/21 1853  Pulse 82 08/03/21 1854  Resp 10 08/03/21 1854  SpO2 97 % 08/03/21 1854    Last Pain:  Vitals:   08/03/21 1722  TempSrc: Temporal  PainSc:       Patients Stated Pain Goal: 0 ([id number])  Complications: No notable events documented.

## 2021-08-03 NOTE — Op Note (Signed)
OPERATIVE REPORT Patient name: Cody Cowan MRN: [id number] DOB: 1965/06/20  DOS: 08/03/21  Preop Dx: Osteomyelitis left fourth toe.  Ulcer left foot Postop Dx: same  Procedure:  1.  Amputation left fourth toe. 2.  Debridement of ulcer left foot with application of ACell wound matrix  Surgeon: Felecia Shelling DPM  Anesthesia: 50-50 mixture of 2% lidocaine plain with 0.5% Marcaine plain totaling 18 mL infiltrated in the patient's left lower extremity and ankle block fashion  Hemostasis: No tourniquet utilized  EBL: 20 mL Materials: Acell micromatrix. Acell cytal wound matrix Injectables: None Pathology: Deep wound cultures taken and sent to pathology for culture and sensitivity  Condition: The patient tolerated the procedure and anesthesia well. No complications noted or reported   Justification for procedure: The patient is a 56 y.o. 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 The patient was told benefits as well as possible side effects of the surgery. The patient consented for surgical correction. The patient consent form was reviewed. All patient questions were answered. No guarantees were expressed or implied. The patient and the surgeon both signed the patient consent form and placed in the patient's chart.   Procedure in Detail: The patient was brought to the operating room, placed in the operating table in the supine position at which time an aseptic scrub and drape were performed about the patient's respective lower extremity after anesthesia was induced as described above. Attention was then directed to the surgical area where procedure number one commenced.  Procedure #1: Amputation left fourth toe A racquet type incision was planned and made about the fourth MTP joint of the left foot.  Incision was carried straight down to the level of joint  capsule at which time the fourth toe was disarticulated at the level of the fourth MTP joint and distracted distally and removed in toto.  Any necrotic nonviable tissue in the area was excisionally debrided away using a #15 scalpel.  Blunt soft tissue dissection was also performed to the deeper compartments of the foot which did not indicate any purulence or drainage from the area.  Cultures were taken and sent to pathology for culture and sensitivity.  Copious irrigation of the surgical amputation area was performed using normal saline in preparation for primary closure.  3-0 nylon suture was utilized to reapproximate the surgical amputation site.   Procedure #2: Excisional debridement of ulcer left foot with application of ACell wound matrix Attention was then directed to the ulcer of the left foot.Medically necessary excisional debridement including muscle and deep fascial tissue was performed using a #15 scalpel.  Excisional debridement of the necrotic nonviable tissue down to healthier bleeding viable tissue was performed.  The wound measures approximately 1.5 x 1.0 x 0.3 cm.  After appropriate debridement and irrigation, ACell wound matrix powder followed by 5cmx5cm Cytal wound graft was applied to the wound in the appropriate manner.  Nonadherent Adaptic and surgical lube was applied to the wound  Dry sterile compressive dressings were then applied to all previously mentioned incision sites about the patient's lower extremity. The tourniquet which was used for hemostasis was deflated. All normal neurovascular responses including pink color and warmth returned all the digits of patient's lower extremity.  The patient was then transferred from the operating room to the recovery room having tolerated the procedure and anesthesia well. All vital signs are stable. After a  brief stay in the recovery room the patient was readmitted to inpatient room.  Podiatry will follow while inpatient   Felecia Shelling,  DPM [facility]  Dr. Felecia Shelling, DPM    [address] [place], [place] [address]                Office [phone]  Fax [phone]

## 2021-08-03 NOTE — Anesthesia Postprocedure Evaluation (Signed)
Anesthesia Post Note  Patient: Cody Cowan  Procedure(s) Performed: AMPUTATION TOE 4th digit left toe (Left: Toe) INCISION AND DRAINAGE LEFT FOOT (Left: Foot)  Patient location during evaluation: PACU Anesthesia Type: General Level of consciousness: awake and alert Pain management: pain level controlled Vital Signs Assessment: post-procedure vital signs reviewed and stable Respiratory status: spontaneous breathing, nonlabored ventilation and respiratory function stable Cardiovascular status: blood pressure returned to baseline and stable Postop Assessment: no apparent nausea or vomiting Anesthetic complications: no   No notable events documented.   Last Vitals:  Vitals:   08/03/21 1915 08/03/21 1930  BP: (!) 89/65 (!) 87/66  Pulse: 94 90  Resp: (!) 4 12  Temp:    SpO2: 100% 99%    Last Pain:  Vitals:   08/03/21 1900  TempSrc:   PainSc: 0-No pain                 Foye Deer

## 2021-08-03 NOTE — Anesthesia Preprocedure Evaluation (Addendum)
Anesthesia Evaluation  Patient identified by MRN, date of birth, ID band Patient awake    Reviewed: Allergy & Precautions, NPO status , Patient's Chart, lab work & pertinent test results  Airway Mallampati: III  TM Distance: >3 FB Neck ROM: full    Dental  (+) Poor Dentition, Chipped, Missing   Pulmonary neg pulmonary ROS,    Pulmonary exam normal        Cardiovascular Exercise Tolerance: Good  Rhythm:Regular Rate:Tachycardia - Peripheral Edema    Neuro/Psych negative neurological ROS  negative psych ROS   GI/Hepatic (+)     substance abuse  alcohol use, H/o pancreatitis Reports vomiting last week with occasional episodes of bloating. Denies Reflux today.   Endo/Other  diabetes, Poorly Controlled, Type 2A1c 14.8 5/28  Renal/GU negative Renal ROS  negative genitourinary   Musculoskeletal   Abdominal Normal abdominal exam  (+)   Peds  Hematology  (+) Blood dyscrasia, anemia ,   Anesthesia Other Findings Past Medical History: No date: Diabetes (HCC)  BMI    Body Mass Index: 18.65 kg/m      Reproductive/Obstetrics negative OB ROS                            Anesthesia Physical Anesthesia Plan  ASA: 3  Anesthesia Plan: General   Post-op Pain Management: Minimal or no pain anticipated and Regional block*   Induction: Intravenous  PONV Risk Score and Plan: Propofol infusion and TIVA  Airway Management Planned: Natural Airway and Simple Face Mask  Additional Equipment:   Intra-op Plan:   Post-operative Plan:   Informed Consent: I have reviewed the patients History and Physical, chart, labs and discussed the procedure including the risks, benefits and alternatives for the proposed anesthesia with the patient or authorized representative who has indicated his/her understanding and acceptance.     Dental Advisory Given  Plan Discussed with: Anesthesiologist, CRNA and  Surgeon  Anesthesia Plan Comments:        Anesthesia Quick Evaluation

## 2021-08-03 NOTE — Plan of Care (Signed)
  Problem: Education: Goal: Knowledge of General Education information will improve Description: Including pain rating scale, medication(s)/side effects and non-pharmacologic comfort measures Outcome: Progressing   Problem: Health Behavior/Discharge Planning: Goal: Ability to manage health-related needs will improve Outcome: Progressing   Problem: Clinical Measurements: Goal: Ability to maintain clinical measurements within normal limits will improve Outcome: Progressing   Problem: Clinical Measurements: Goal: Will remain free from infection Outcome: Progressing   Problem: Clinical Measurements: Goal: Diagnostic test results will improve Outcome: Progressing   Problem: Clinical Measurements: Goal: Respiratory complications will improve Outcome: Progressing   Problem: Clinical Measurements: Goal: Cardiovascular complication will be avoided Outcome: Progressing   Problem: Nutrition: Goal: Adequate nutrition will be maintained Outcome: Progressing   Problem: Coping: Goal: Level of anxiety will decrease Outcome: Progressing   Problem: Safety: Goal: Ability to remain free from injury will improve Outcome: Progressing   Problem: Skin Integrity: Goal: Risk for impaired skin integrity will decrease Outcome: Progressing

## 2021-08-03 NOTE — Progress Notes (Signed)
Initial Nutrition Assessment  DOCUMENTATION CODES:   Severe malnutrition in context of chronic illness  INTERVENTION:  - Once diet advances, recommend carb modified diet  - Nepro Shake po BID, each supplement provides 425 kcal and 19 grams protein  - Ocuvite daily for wound healing (provides zinc, vitamin A, vitamin C, Vitamin E, copper, and selenium)  NUTRITION DIAGNOSIS:   Severe Malnutrition related to chronic illness (uncontrolled diabetes) as evidenced by severe fat depletion, severe muscle depletion, moderate muscle depletion, percent weight loss.  GOAL:   Patient will meet greater than or equal to 90% of their needs  MONITOR:   PO intake, Supplement acceptance, Diet advancement, Labs, Weight trends  REASON FOR ASSESSMENT:   Consult Assessment of nutrition requirement/status, Diet education, Wound healing  ASSESSMENT:   Pt admitted with L 4th toe infection, found to have osteomyelitis. PMH significant for alcohol abuse, pancreatitis and uncontrolled T2DM.  Per Podiatry, plans for L 4th toe amputation today pending MRI results of L foot.   Spoke with pt via interpreter services. Interpreter # [id number] Steward Drone)   Pt reports PTA he was eating well with no changes to his intake. He recalls eating 2-3 meals per day consisting of boiled vegetables, chicken and melon or strawberries. Pt states that he does not typically buy pasta, rice or chips. For fluid intake he reports drinking 1 cup of apple juice daily.   Meal completions: 05/30: 100%-breakfast  Pt states that 6 months ago he weighed ~170 lbs and endorses wt loss with a last known wt of 124 lbs. Reviewed wt hx. Pt noted to have a 10% wt loss within the last 4 months which is clinically significant for time frame.   Pt states that he has not been checking his blood sugar or administering insulin as he ran out about 2 months ago. Noted pt has had difficulty obtaining d/t lack on insurance. Spoke with pt about food  access at home. He states that his sister is able to provide transportation to the grocery store and denies financial difficulty obtaining food.   Pt would benefit from addition of nutrition supplementation to promote wound healing. Pt is agreeable to receive these once his diet advances.   Medications: SSI 0-15 units TID, 3 units TID, semglee 30 units, IV abx   Labs: sodium 132, alkaline phosphatase 133, HgbA1c 14.8%,   CBG's 171-361 x 24 hours  NUTRITION - FOCUSED PHYSICAL EXAM:  Flowsheet Row Most Recent Value  Orbital Region Mild depletion  Upper Arm Region Severe depletion  Thoracic and Lumbar Region Severe depletion  Buccal Region Moderate depletion  Temple Region Moderate depletion  Clavicle Bone Region Severe depletion  Clavicle and Acromion Bone Region Severe depletion  Scapular Bone Region Moderate depletion  Dorsal Hand Moderate depletion  Patellar Region Severe depletion  Anterior Thigh Region Severe depletion  Posterior Calf Region Severe depletion  Edema (RD Assessment) None  Hair Reviewed  Eyes Reviewed  Mouth Reviewed  Skin Reviewed  Nails Reviewed       Diet Order:   Diet Order             Diet NPO time specified  Diet effective now                   EDUCATION NEEDS:   Education needs have been addressed  Skin:  Skin Assessment: Reviewed RN Assessment  Last BM:  5/29  Height:   Ht Readings from Last 1 Encounters:  02/28/21 5\' 6"  (1.676 m)  Weight:   Wt Readings from Last 1 Encounters:  08/03/21 52.4 kg   BMI:  Body mass index is 18.65 kg/m.  Estimated Nutritional Needs:   Kcal:  1600-1800  Protein:  80-95g  Fluid:  >/=1.6L  Drusilla Kanner, RDN, LDN Clinical Nutrition

## 2021-08-03 NOTE — Brief Op Note (Signed)
08/01/2021 - 08/03/2021  6:55 PM  PATIENT:  Cody Cowan  56 y.o. male  PRE-OPERATIVE DIAGNOSIS:  Osteomyelitis  POST-OPERATIVE DIAGNOSIS:  * No post-op diagnosis entered *  PROCEDURE:  Procedure(s): AMPUTATION TOE 4th digit left toe (Left) INCISION AND DRAINAGE LEFT FOOT (Left)  SURGEON:  Surgeon(s) and Role:    Felecia Shelling, DPM - Primary  PHYSICIAN ASSISTANT:   ASSISTANTS: none   ANESTHESIA:   local and general  EBL:  5 mL   BLOOD ADMINISTERED:none  DRAINS: none   LOCAL MEDICATIONS USED:  MARCAINE    and XYLOCAINE   SPECIMEN:  Source of Specimen:  Culture left fourth toe  DISPOSITION OF SPECIMEN:  PATHOLOGY  COUNTS:  YES  TOURNIQUET:  * Missing tourniquet times found for documented tourniquets in log: [id number] *  DICTATION: .Dragon Dictation  PLAN OF CARE: Admit to inpatient   PATIENT DISPOSITION:  PACU - hemodynamically stable.   Delay start of Pharmacological VTE agent (>24hrs) due to surgical blood loss or risk of bleeding: no

## 2021-08-04 ENCOUNTER — Inpatient Hospital Stay: Payer: Self-pay

## 2021-08-04 ENCOUNTER — Encounter: Payer: Self-pay | Admitting: Podiatry

## 2021-08-04 DIAGNOSIS — L97529 Non-pressure chronic ulcer of other part of left foot with unspecified severity: Secondary | ICD-10-CM | POA: Insufficient documentation

## 2021-08-04 DIAGNOSIS — M869 Osteomyelitis, unspecified: Secondary | ICD-10-CM

## 2021-08-04 LAB — BASIC METABOLIC PANEL
Anion gap: 5 (ref 5–15)
BUN: 16 mg/dL (ref 6–20)
CO2: 30 mmol/L (ref 22–32)
Calcium: 9.1 mg/dL (ref 8.9–10.3)
Chloride: 97 mmol/L — ABNORMAL LOW (ref 98–111)
Creatinine, Ser: 0.74 mg/dL (ref 0.61–1.24)
GFR, Estimated: >60 mL/min (ref >60)
Glucose, Bld: 295 mg/dL — ABNORMAL HIGH (ref 70–99)
Potassium: 4.8 mmol/L (ref 3.5–5.1)
Sodium: 132 mmol/L — ABNORMAL LOW (ref 135–145)

## 2021-08-04 LAB — GLUCOSE, CAPILLARY
Glucose-Capillary: 313 mg/dL — ABNORMAL HIGH (ref 70–99)
Glucose-Capillary: 285 mg/dL — ABNORMAL HIGH (ref 70–99)
Glucose-Capillary: 314 mg/dL — ABNORMAL HIGH (ref 70–99)
Glucose-Capillary: 324 mg/dL — ABNORMAL HIGH (ref 70–99)
Glucose-Capillary: 461 mg/dL — ABNORMAL HIGH (ref 70–99)
Glucose-Capillary: 227 mg/dL — ABNORMAL HIGH (ref 70–99)

## 2021-08-04 MED ORDER — INSULIN ASPART 100 UNIT/ML IJ SOLN
20.0000 [IU] | Freq: Once | INTRAMUSCULAR | Status: AC
  Administered 2021-08-04: 20 [IU] via SUBCUTANEOUS

## 2021-08-04 MED ORDER — INSULIN ASPART 100 UNIT/ML IJ SOLN
4.0000 [IU] | Freq: Three times a day (TID) | INTRAMUSCULAR | Status: DC
  Administered 2021-08-04 – 2021-08-05 (×2): 4 [IU] via SUBCUTANEOUS
  Filled 2021-08-04 (×2): qty 1

## 2021-08-04 MED ORDER — INSULIN GLARGINE-YFGN 100 UNIT/ML ~~LOC~~ SOLN
20.0000 [IU] | Freq: Two times a day (BID) | SUBCUTANEOUS | Status: DC
  Administered 2021-08-04: 20 [IU] via SUBCUTANEOUS
  Filled 2021-08-04 (×2): qty 0.2

## 2021-08-04 MED ORDER — CEFEPIME HCL 2 G IV SOLR
2.0000 g | Freq: Three times a day (TID) | INTRAVENOUS | Status: DC
  Administered 2021-08-04 – 2021-08-06 (×6): 2 g via INTRAVENOUS
  Filled 2021-08-04 (×2): qty 2
  Filled 2021-08-04: qty 12.5
  Filled 2021-08-04: qty 2
  Filled 2021-08-04: qty 12.5
  Filled 2021-08-04: qty 2
  Filled 2021-08-04: qty 12.5

## 2021-08-04 NOTE — Hospital Course (Addendum)
Taken from prior notes.  Cody Cowan is a 56 y.o. male with medical  history significant for alcohol abuse, pancreatitis, DM2 who presents to ED 08/01/21 with complaint of 4th toe infection- redness and drainage as well as black discoloration, plus chronic ulcer on left foot x 6 months but noted no drainage or changes recently but has intermittent pain. He also endorses stepping on a nail and notes puncture wound above his chronic ulcer.  Started on vancomycin, Zosyn.  MRI with concern of left foot with fourth toe osteomyelitis. MRI of left ankle was negative for any osteomyelitis but did show severe tendinosis of multiple tendons and dorsal spurring-please see the full report.  ABI with some distal waveform abnormalities indicative of some degree of tibial/small vessel disease in the distal lower extremities bilaterally, vascular surgery was also consulted.  Patient was taken to the OR by podiatry on 08/03/2021, s/p fourth toe amputation.  Tolerated the procedure well.  Preliminary cultures with gram-negative rods and gram-positive cocci in pairs. Currently on vancomycin and Zosyn. Zosyn was switched with cefepime, slight worsening of renal function.  6/1: Continue to have hyperglycemia, Semglee was increased to 25 twice daily and mealtime coverage increased to 6 units along with SSI. Vascular surgery is planning to do angiography tomorrow morning. PT is recommending outpatient therapy.  6/2: CBG remained elevated, Semglee was increased to 30 and mealtime coverage was increased to 8.  Going for angiography with vascular surgery today. Wound cultures with E. coli, Enterobacter fascia, Klebsiella and anaerobes.  Bone margins are not clear and still show signs of osteomyelitis. Antibiotics switched to Zosyn-Will need long antibiotics. ID consulted  6/3 s/p Aortogram LLE angiogram without stenting/revascularization. He reports pain is improving.    6/4: Bone and intraoperative  cultures with Klebsiella pneumoniae, E. coli and Enterococcus faecalis.  6/5: Antibiotics were switched to Unasyn by ID and he is being discharged on 2 weeks of Augmentin.  Patient will follow-up with podiatry and infectious disease. He will continue current medication and follow-up with his providers.

## 2021-08-04 NOTE — Progress Notes (Signed)
  Progress Note   Patient: Cody Cowan NOB:[id number] DOB: 1965-08-18 DOA: 08/01/2021     3 DOS: the patient was seen and examined on 08/04/2021   Brief hospital course: Taken from prior notes.  Cody Cowan is a 56 y.o. male with medical  history significant for alcohol abuse, pancreatitis, DM2 who presents to ED 08/01/21 with complaint of 4th toe infection- redness and drainage as well as black discoloration, plus chronic ulcer on left foot x 6 months but noted no drainage or changes recently but has intermittent pain. He also endorses stepping on a nail and notes puncture wound above his chronic ulcer.  Started on vancomycin, Zosyn.  MRI with concern of left foot with fourth toe osteomyelitis. MRI of left ankle was negative for any osteomyelitis but did show severe tendinosis of multiple tendons and dorsal spurring-please see the full report.  ABI with some distal waveform abnormalities indicative of some degree of tibial/small vessel disease in the distal lower extremities bilaterally, vascular surgery was also consulted.  Patient was taken to the OR by podiatry on 08/03/2021, s/p fourth toe amputation.  Tolerated the procedure well.  Preliminary cultures with gram-negative rods and gram-positive cocci in pairs. Currently on vancomycin and Zosyn. Zosyn was switched with cefepime, slight worsening of renal function.     Assessment and Plan: * Osteomyelitis (HCC)  S/p amputation of left fourth toe.  Preliminary cultures with gram-negative rods and gram-positive cocci in pairs.  Switch Zosyn with cefepime  Continue with vancomycin for now-we will de-escalate once susceptibilities available.  ABI with concern of distal PAD, vascular surgery was consulted and they will see the patient tomorrow  Uncontrolled type 2 diabetes mellitus with hyperglycemia, without long-term current use of insulin (HCC)  CBG remained elevated.  -Increase Semglee to 20 units  twice daily  -Increase mealtime coverage to 4 units  -Continue with SSI  EtOH dependence (HCC) patient states drinks occasionally , last drink 6 days ago he denies history of withdrawals  CIWA protocol     Subjective: Patient was seen and examined today.  Denies any new complaints.  He was asking about discharge.  Physical Exam: Vitals:   08/04/21 0525 08/04/21 0555 08/04/21 0743 08/04/21 1711  BP: (!) 126/94  105/68 138/81  Pulse: (!) 101  92 86  Resp: 17  16   Temp: 100 F (37.8 C) 98.8 F (37.1 C) 99.6 F (37.6 C) 98.2 F (36.8 C)  TempSrc:  Oral Oral   SpO2: 100%  100% 100%  Weight:       General.     In no acute distress. Pulmonary.  Lungs clear bilaterally, normal respiratory effort. CV.  Regular rate and rhythm, no JVD, rub or murmur. Abdomen.  Soft, nontender, nondistended, BS positive. CNS.  Alert and oriented .  No focal neurologic deficit. Extremities.  No edema, no cyanosis, pulses intact and symmetrical.  Left foot with Ace wrap Psychiatry.  Judgment and insight appears normal.  Data Reviewed: Prior notes, labs and images reviewed  Family Communication: Discussed with patient  Disposition: Status is: Inpatient Remains inpatient appropriate because: Severity of illness   Planned Discharge Destination: Home  DVT prophylaxis.  Lovenox Time spent: 45 minutes  This record has been created using Conservation officer, historic buildings. Errors have been sought and corrected,but may not always be located. Such creation errors do not reflect on the standard of care.  Author: Arnetha Courser, MD 08/04/2021 5:37 PM  For on call review [email or web address].

## 2021-08-04 NOTE — Evaluation (Signed)
Physical Therapy Evaluation Patient Details Name: Cody Cowan MRN: [id number] DOB: 09-20-1965 Today's Date: 08/04/2021  History of Present Illness  Pt is a 56 yo male s/p left 4th toe ambutation with debridement of L foot ulcer. PMH of etoh, DM.  Clinical Impression  Patient alert, seated in recliner at start of session. Interpretor utilized throughout, pt reported no pain. He stated at baseline he is independent, lives alone, and has a sister that could drive him if needed.   The pt and PT reviewed weight bearing status, precautions, post op shoe and RW use throughout session with fair-poor carry over, pt challenged by [facility] status, preferred to ambulate WBAT with some improvement with cues. He was able to transfer and ambulate with supervision, and navigated stairs with CGA. Pt able to teach back proper technique by end of session.  Overall the patient demonstrated deficits (see "PT Problem List") that impede the patient's functional abilities, safety, and mobility and would benefit from skilled PT intervention. Recommendation at this time is home with outpatient PT to continue education on PWB status, RW use, and progression per protocol as able.        Recommendations for follow up therapy are one component of a multi-disciplinary discharge planning process, led by the attending physician.  Recommendations may be updated based on patient status, additional functional criteria and insurance authorization.  Follow Up Recommendations Outpatient PT    Assistance Recommended at Discharge PRN  Patient can return home with the following  Assistance with cooking/housework;Assist for transportation;Help with stairs or ramp for entrance    Equipment Recommendations Rolling walker (2 wheels)  Recommendations for Other Services       Functional Status Assessment Patient has had a recent decline in their functional status and demonstrates the ability to make significant improvements in  function in a reasonable and predictable amount of time.     Precautions / Restrictions Precautions Precautions: Fall Restrictions Weight Bearing Restrictions: Yes LLE Weight Bearing: Partial weight bearing LLE Partial Weight Bearing Percentage or Pounds: 50% in post op shoe      Mobility  Bed Mobility               General bed mobility comments: pt up in recliner at start/end of session    Transfers Overall transfer level: Needs assistance Equipment used: Rolling walker (2 wheels) Transfers: Sit to/from Stand Sit to Stand: Supervision           General transfer comment: cued for hand placement and weight bearing status    Ambulation/Gait   Gait Distance (Feet):  (60ft, and then additional 69ft) Assistive device: Rolling walker (2 wheels)         General Gait Details: cued for PWB throughout with poor-fair carryover  Stairs Stairs: Yes Stairs assistance: Min guard Stair Management: One rail Left, Step to pattern Number of Stairs: 4 General stair comments: cued for PWB throughout, poor carryover  Wheelchair Mobility    Modified Rankin (Stroke Patients Only)       Balance Overall balance assessment: Needs assistance Sitting-balance support: Feet supported Sitting balance-Leahy Scale: Good       Standing balance-Leahy Scale: Fair                               Pertinent Vitals/Pain Pain Assessment Pain Assessment: No/denies pain    Home Living Family/patient expects to be discharged to:: Private residence Living Arrangements: Alone Available Help at Discharge: Family;Other (  Comment) (sister) Type of Home: House Home Access: Stairs to enter Entrance Stairs-Rails: Left Entrance Stairs-Number of Steps: 5   Home Layout: One level Home Equipment: None      Prior Function Prior Level of Function : Independent/Modified Independent                     Hand Dominance        Extremity/Trunk Assessment   Upper  Extremity Assessment Upper Extremity Assessment: Overall WFL for tasks assessed    Lower Extremity Assessment Lower Extremity Assessment:  (able to move all extremities against gravity)    Cervical / Trunk Assessment Cervical / Trunk Assessment: Normal  Communication   Communication: Interpreter utilized  Cognition Arousal/Alertness: Awake/alert Behavior During Therapy: WFL for tasks assessed/performed Overall Cognitive Status: Within Functional Limits for tasks assessed                                          General Comments      Exercises     Assessment/Plan    PT Assessment Patient needs continued PT services  PT Problem List Decreased strength;Decreased mobility;Decreased range of motion;Decreased activity tolerance;Decreased balance;Decreased knowledge of use of DME;Decreased knowledge of precautions;Pain       PT Treatment Interventions DME instruction;Therapeutic exercise;Gait training;Balance training;Stair training;Neuromuscular re-education;Functional mobility training;Therapeutic activities;Patient/family education    PT Goals (Current goals can be found in the Care Plan section)  Acute Rehab PT Goals Patient Stated Goal: to go home PT Goal Formulation: With patient Time For Goal Achievement: 08/18/21 Potential to Achieve Goals: Good    Frequency Min 2X/week     Co-evaluation               AM-PAC PT "6 Clicks" Mobility  Outcome Measure Help needed turning from your back to your side while in a flat bed without using bedrails?: None Help needed moving from lying on your back to sitting on the side of a flat bed without using bedrails?: None Help needed moving to and from a bed to a chair (including a wheelchair)?: None Help needed standing up from a chair using your arms (e.g., wheelchair or bedside chair)?: None Help needed to walk in hospital room?: None Help needed climbing 3-5 steps with a railing? : A Little 6 Click Score:  23    End of Session Equipment Utilized During Treatment: Gait belt Activity Tolerance: Patient tolerated treatment well Patient left: in chair;with call bell/phone within reach Nurse Communication: Mobility status PT Visit Diagnosis: Other abnormalities of gait and mobility (R26.89);Difficulty in walking, not elsewhere classified (R26.2);Muscle weakness (generalized) (M62.81);Pain Pain - Right/Left: Left Pain - part of body: Ankle and joints of foot    Time: 1050-1131 PT Time Calculation (min) (ACUTE ONLY): 41 min   Charges:   PT Evaluation $PT Eval Low Complexity: 1 Low PT Treatments $Gait Training: 23-37 mins $Therapeutic Exercise: 8-22 mins       Olga Coaster PT, DPT 1:03 PM,08/04/21

## 2021-08-04 NOTE — Progress Notes (Signed)
PT Cancellation Note  Patient Details Name: Cody Cowan MRN: [id number] DOB: 1966-01-21   Cancelled Treatment:    Reason Eval/Treat Not Completed: Patient at procedure or test/unavailable   Olga Coaster PT, DPT 9:15 AM,08/04/21

## 2021-08-04 NOTE — Consult Note (Signed)
Pharmacy Antibiotic Note  Cody Cowan is a 56 y.o. male admitted on 08/01/2021 with  Foot infection .  Pharmacy has been consulted for pip/tazo and vancomycin dosing.  -s/p toe amputation 5/30  -Scr increasing- change Zosyn to Cefepime 5/31 -f/u toe wound cx  Plan: Continue vancomycin 750 mg q12H.  Predicted AUC of 458. Goal AUC of 400-550. Scr 0.8, VD 0.72, IBW.  Plan to check vancomycin level after 4th or 5th dose.   -possible change to PO, will hold off on Vancomycin levels currently- f/u Scr    Weight: 56.5 kg (124 lb 9 oz)  Temp (24hrs), Avg:98.8 F (37.1 C), Min:97.5 F (36.4 C), Max:100 F (37.8 C)  Recent Labs  Lab 08/01/21 1519 08/01/21 1847 08/02/21 0409 08/03/21 0409 08/04/21 0356  WBC 8.3  --  7.4 8.6  --   CREATININE 0.71 0.53* 0.48* 0.63 0.74  LATICACIDVEN 1.9  --   --   --   --      Estimated Creatinine Clearance: 84.4 mL/min (by C-G formula based on SCr of 0.74 mg/dL).    No Known Allergies  Antimicrobials this admission: 5/28 vancomycin >>  5/28 pip/tazo >> 5/31 5/31 Cefepime >>  Dose adjustments this admission: None  Microbiology results: 5/28 BCx: NGx2d GI panel/Cdiff negative 5/30: Toe wound cx: GNR, GPC -reincubated  Thank you for allowing pharmacy to be a part of this patient's care.  Neils Siracusa A 08/04/2021 2:49 PM

## 2021-08-04 NOTE — Progress Notes (Signed)
  Subjective:  Patient ID: Cody Cowan, male    DOB: [birth year],  MRN: [id number]  Resting at bedside comfortably eating dinner, has surgical shoe and dressing on  Negative for chest pain and shortness of breath Review of all other systems is negative Objective:   Vitals:   08/04/21 0555 08/04/21 0743  BP:  105/68  Pulse:  92  Resp:  16  Temp: 98.8 F (37.1 C) 99.6 F (37.6 C)  SpO2:  100%   General AA&O x3. Normal mood and affect.  Vascular DP pulses palpable, PT pulses weaker, good capillary fill time  Neurologic Epicritic sensation grossly reduced.  Dermatologic Slight maceration to incision.  Remains well coapted.  No purulence new infection or malodor  Orthopedic: MMT 5/5 in dorsiflexion, plantarflexion, inversion, and eversion. Normal joint ROM without pain or crepitus.   Study Result  Narrative & Impression  CLINICAL DATA:  Diabetes, osteomyelitis of left fourth toe and status post amputation of left fourth toe.   EXAM: NONINVASIVE PHYSIOLOGIC VASCULAR STUDY OF BILATERAL LOWER EXTREMITIES   TECHNIQUE: Evaluation of both lower extremities were performed at rest, including calculation of ankle-brachial indices with single level Doppler, pressure and pulse volume recording.   COMPARISON:  None Available.   FINDINGS: Right ABI:  1.35   Left ABI:  1.07   Right Lower Extremity: Monophasic posterior tibial and triphasic dorsalis pedis waveforms.   Left Lower Extremity: Biphasic posterior tibial and monophasic dorsalis pedis waveforms.   1.0-1.4 Normal   IMPRESSION: Normal resting ankle-brachial indices. 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     Electronically Signed   By: Irish Lack M.D.   On: 08/04/2021 10:11   Assessment & Plan:  Patient was evaluated and treated and all questions answered.  POD #1 status post left  fourth toe amputation, debridement of multiple wounds -Dressing was changed today.  Recommend 3 times weekly dressing changes at home with nonadherent such as Telfa or Xeroform, dry sterile dressings -Culture yesterday with GPC's and GNR's, continue broad-spectrum antibiotics can narrow once speciation is complete -WBAT LLE in surgical shoe -ABIs reviewed with patient.  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  I recommend a vascular surgery consult to determine if there is any value in intervention while he is here in the hospital. -We will continue to follow  Edwin Cap, DPM  Accessible via secure chat for questions or concerns.

## 2021-08-04 NOTE — Progress Notes (Signed)
Inpatient Diabetes Program Recommendations  AACE/ADA: New Consensus Statement on Inpatient Glycemic Control (2015)  Target Ranges:  Prepandial:   less than 140 mg/dL      Peak postprandial:   less than 180 mg/dL (1-2 hours)      Critically ill patients:  140 - 180 mg/dL   Lab Results  Component Value Date   GLUCAP 324 (H) 08/04/2021   HGBA1C 14.8 (H) 08/01/2021    Review of Glycemic Control  Latest Reference Range & Units 08/03/21 08:04 08/03/21 11:57 08/03/21 17:22 08/03/21 18:55 08/04/21 06:41 08/04/21 07:36 08/04/21 08:05 08/04/21 12:01  Glucose-Capillary 70 - 99 mg/dL 861 (H) 683 (H) 729 (H) 186 (H) 313 (H) 314 (H) 285 (H) 324 (H)  (H): Data is abnormally high  History: DM, ETOH Abuse, Pancreatitis   Home DM Meds: Humalog 75/25 Insulin 30 units BID                             Metformin 500 mg BID   Current Orders: Novolog Moderate Correction Scale/ SSI (0-15 units) TID AC                            Novolog 3 units TID with meals                            Semglee 30 units QHS  Inpatient Diabetes Program Recommendations:   Please consider: -Increase Semglee to 35 units q hs -Increase Novolog meal coverage if 4 units tid ac meals if eats 50% meals Secure chat sent to Dr. Nelson Chimes.  Thank you, Billy Fischer. Cortni Tays, RN, MSN, CDE  Diabetes Coordinator Inpatient Glycemic Control Team Team Pager [phone] (8am-5pm) 08/04/2021 12:42 PM

## 2021-08-04 NOTE — TOC Progression Note (Addendum)
Transition of Care [facility]) - Progression Note    Patient Details  Name: Cody Cowan MRN: [id number] Date of Birth: Oct 08, 1965  Transition of Care [facility]) CM/SW [place], RN Phone Number: 08/04/2021, 12:20 PM  Clinical Narrative:    Faxed the outpatient PT referral to [facility]  Provided the patient an open door clinic application in [place], I explained that he needed to call to get an application appointment  He  understands He can get medication from med mgt at no cost aside from narcotics      Expected Discharge Plan and Services                                                 Social Determinants of Health (SDOH) Interventions    Readmission Risk Interventions     View : No data to display.

## 2021-08-04 NOTE — Progress Notes (Signed)
Pt's cbg noted at 461mg /dl before supper,pt was asymptomatic,A&O x4, able to communicate appropriately and follow commands.MD notified per protocol.new order to administer 20 units of novolog insulin.Pt was adminisiter insulin per order.will recheck cbg per protocol.

## 2021-08-05 LAB — GLUCOSE, CAPILLARY
Glucose-Capillary: 281 mg/dL — ABNORMAL HIGH (ref 70–99)
Glucose-Capillary: 297 mg/dL — ABNORMAL HIGH (ref 70–99)
Glucose-Capillary: 380 mg/dL — ABNORMAL HIGH (ref 70–99)
Glucose-Capillary: 308 mg/dL — ABNORMAL HIGH (ref 70–99)

## 2021-08-05 LAB — BASIC METABOLIC PANEL
Anion gap: 4 — ABNORMAL LOW (ref 5–15)
BUN: 18 mg/dL (ref 6–20)
CO2: 28 mmol/L (ref 22–32)
Calcium: 9.1 mg/dL (ref 8.9–10.3)
Chloride: 101 mmol/L (ref 98–111)
Creatinine, Ser: 0.56 mg/dL — ABNORMAL LOW (ref 0.61–1.24)
GFR, Estimated: >60 mL/min (ref >60)
Glucose, Bld: 331 mg/dL — ABNORMAL HIGH (ref 70–99)
Potassium: 4.7 mmol/L (ref 3.5–5.1)
Sodium: 133 mmol/L — ABNORMAL LOW (ref 135–145)

## 2021-08-05 LAB — SURGICAL PATHOLOGY

## 2021-08-05 MED ORDER — INSULIN ASPART 100 UNIT/ML IJ SOLN
6.0000 [IU] | Freq: Three times a day (TID) | INTRAMUSCULAR | Status: DC
  Administered 2021-08-05 (×2): 6 [IU] via SUBCUTANEOUS
  Filled 2021-08-05 (×2): qty 1

## 2021-08-05 MED ORDER — INSULIN GLARGINE-YFGN 100 UNIT/ML ~~LOC~~ SOLN
25.0000 [IU] | Freq: Two times a day (BID) | SUBCUTANEOUS | Status: DC
  Administered 2021-08-05 – 2021-08-06 (×3): 25 [IU] via SUBCUTANEOUS
  Filled 2021-08-05 (×3): qty 0.25

## 2021-08-05 NOTE — H&P (View-Only) (Signed)
[facility] Vascular Consult Note  MRN : [id number]  Cody Cowan is a 56 y.o. ([birth year]) male who presents with chief complaint of  Chief Complaint  Patient presents with   Foot Ulcer  .   Consulting Physician: Arnetha Courser, MD Reason for consult: Abnormal ABI with osteomyelitis History of Present Illness: The patient is a 56 year old male with past medical history of alcohol abuse, diabetes mellitus, pancreatitis that presented to [facility] on 08/01/2021 with redness and drainage and black discoloration of his left fourth toe.  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  MRI showed osteomyelitis of the left fourth toe.  The patient underwent amputation of the fourth toe on 08/03/2021.  The patient also had noninvasive vascular studies which showed normal ABIs bilaterally however waveforms suggested possible tibial level disease of the left lower extremity.  Current Facility-Administered Medications  Medication Dose Route Frequency Provider Last Rate Last Admin   acetaminophen (TYLENOL) tablet 650 mg  650 mg Oral Q6H PRN Lurline Del, MD       Or   acetaminophen (TYLENOL) suppository 650 mg  650 mg Rectal Q6H PRN Lurline Del, MD       albuterol (PROVENTIL) (2.5 MG/3ML) 0.083% nebulizer solution 2.5 mg  2.5 mg Nebulization Q2H PRN Skip Mayer A, MD       ceFEPIme (MAXIPIME) 2 g in sodium chloride 0.9 % 100 mL IVPB  2 g Intravenous Q8H Amin, Tilman Neat, MD 200 mL/hr at 08/05/21 2108 2 g at 08/05/21 2108   dextrose 50 % solution 0-50 mL  0-50 mL Intravenous PRN Lurline Del, MD       enoxaparin (LOVENOX) injection 40 mg  40 mg Subcutaneous Q24H Skip Mayer A, MD   40 mg at 08/05/21 2108   feeding supplement (NEPRO CARB STEADY) liquid 237 mL  237 mL Oral BID BM Sunnie Nielsen, DO   237 mL at 08/05/21 1506   HYDROcodone-acetaminophen (NORCO/VICODIN) 5-325  MG per tablet 1-2 tablet  1-2 tablet Oral Q4H PRN Lurline Del, MD   1 tablet at 08/05/21 2108   insulin aspart (novoLOG) injection 0-15 Units  0-15 Units Subcutaneous TID WC Lurline Del, MD   15 Units at 08/05/21 1722   insulin aspart (novoLOG) injection 6 Units  6 Units Subcutaneous TID WC Arnetha Courser, MD   6 Units at 08/05/21 1723   insulin glargine-yfgn (SEMGLEE) injection 25 Units  25 Units Subcutaneous BID Arnetha Courser, MD   25 Units at 08/05/21 2112   morphine (PF) 2 MG/ML injection 2 mg  2 mg Intravenous Q4H PRN Lurline Del, MD       multivitamin-lutein (OCUVITE-LUTEIN) capsule 1 capsule  1 capsule Oral Daily Sunnie Nielsen, DO   1 capsule at 08/04/21 0947   ondansetron (ZOFRAN) tablet 4 mg  4 mg Oral Q6H PRN Lurline Del, MD       Or   ondansetron [facility]) injection 4 mg  4 mg Intravenous Q6H PRN Skip Mayer A, MD       sodium chloride flush (NS) 0.9 % injection 3 mL  3 mL Intravenous Q12H Skip Mayer A, MD   3 mL at 08/05/21 2109   vancomycin (VANCOREADY) IVPB 750 mg/150 mL  750 mg Intravenous Q12H Sunnie Nielsen, DO 150 mL/hr at 08/05/21 2205 750 mg at 08/05/21 2205    Past Medical History:  Diagnosis Date   Diabetes (HCC)  Past Surgical History:  Procedure Laterality Date   AMPUTATION TOE Left 08/03/2021   Procedure: AMPUTATION TOE 4th digit left toe;  Surgeon: Felecia Shelling, DPM;  Location: [facility] ORS;  Service: Podiatry;  Laterality: Left;   INCISION AND DRAINAGE Left 08/03/2021   Procedure: INCISION AND DRAINAGE LEFT FOOT;  Surgeon: Felecia Shelling, DPM;  Location: [facility] ORS;  Service: Podiatry;  Laterality: Left;    Social History Social History   Tobacco Use   Smoking status: Never   Smokeless tobacco: Never  Substance Use Topics   Alcohol use: Not Currently    Family History History reviewed. No pertinent family history.  No Known Allergies   REVIEW OF SYSTEMS (Negative unless checked)  Constitutional:  [] Weight loss  [] Fever  [] Chills Cardiac: [] Chest pain   [] Chest pressure   [] Palpitations   [] Shortness of breath when laying flat   [] Shortness of breath at rest   [] Shortness of breath with exertion. Vascular:  [] Pain in legs with walking   [] Pain in legs at rest   [] Pain in legs when laying flat   [] Claudication   [] Pain in feet when walking  [] Pain in feet at rest  [] Pain in feet when laying flat   [] History of DVT   [] Phlebitis   [] Swelling in legs   [] Varicose veins   [x] Non-healing ulcers Pulmonary:   [] Uses home oxygen   [] Productive cough   [] Hemoptysis   [] Wheeze  [] COPD   [] Asthma Neurologic:  [] Dizziness  [] Blackouts   [] Seizures   [] History of stroke   [] History of TIA  [] Aphasia   [] Temporary blindness   [] Dysphagia   [] Weakness or numbness in arms   [] Weakness or numbness in legs Musculoskeletal:  [] Arthritis   [] Joint swelling   [] Joint pain   [] Low back pain Hematologic:  [] Easy bruising  [] Easy bleeding   [] Hypercoagulable state   [] Anemic  [] Hepatitis Gastrointestinal:  [] Blood in stool   [] Vomiting blood  [] Gastroesophageal reflux/heartburn   [] Difficulty swallowing. Genitourinary:  [] Chronic kidney disease   [] Difficult urination  [] Frequent urination  [] Burning with urination   [] Blood in urine Skin:  [] Rashes   [] Ulcers   [] Wounds Psychological:  [] History of anxiety   []  History of major depression.  Physical Examination  Vitals:   08/05/21 0730 08/05/21 1127 08/05/21 1758 08/05/21 1951  BP: 136/81 (!) 143/86 (!) 148/89 92/65  Pulse: 75 89 93 95  Resp: 16 16 16 17   Temp: 98.6 F (37 C) 97.7 F (36.5 C) 98.6 F (37 C) 98.5 F (36.9 C)  TempSrc:      SpO2: 100% 100% 99% 100%  Weight:       Body mass index is 20.42 kg/m. Gen:  WD/WN, NAD Head: [place]/AT, No temporalis wasting. Prominent temp pulse not noted. Ear/Nose/Throat: Hearing grossly intact, nares w/o erythema or drainage, oropharynx w/o Erythema/Exudate Eyes: Sclera non-icteric, conjunctiva clear Neck:  Trachea midline.  No JVD.  Pulmonary:  Good air movement, respirations not labored, equal bilaterally.  Cardiac: RRR, normal S1, S2. Vascular: Wound of left lower extremity post intervention, unable to palpate pulses due to dressing Vessel Right Left  PT Not Palpable   DP Not Palpable    Gastrointestinal: soft, non-tender/non-distended. No guarding/reflex.  Musculoskeletal: M/S 5/5 throughout.  Extremities without ischemic changes.  No deformity or atrophy. No edema. Neurologic: Sensation grossly intact in extremities.  Symmetrical.  Speech is fluent. Motor exam as listed above. Psychiatric: Judgment intact, Mood & affect appropriate for pt's clinical situation.  CBC Lab Results  Component Value Date   WBC 8.6 08/03/2021   HGB 12.2 (L) 08/03/2021   HCT 35.7 (L) 08/03/2021   MCV 90.2 08/03/2021   PLT 287 08/03/2021    BMET    Component Value Date/Time   NA 133 (L) 08/05/2021 0411   K 4.7 08/05/2021 0411   CL 101 08/05/2021 0411   CO2 28 08/05/2021 0411   GLUCOSE 331 (H) 08/05/2021 0411   BUN 18 08/05/2021 0411   CREATININE 0.56 (L) 08/05/2021 0411   CALCIUM 9.1 08/05/2021 0411   GFRNONAA >60 08/05/2021 0411   Estimated Creatinine Clearance: 85.7 mL/min (A) (by C-G formula based on SCr of 0.56 mg/dL (L)).  COAG No results found for: INR, PROTIME  Radiology DG Ankle Complete Left  Result Date: 08/01/2021 CLINICAL DATA:  Evaluate for osteomyelitis. EXAM: LEFT ANKLE COMPLETE - 3+ VIEW COMPARISON:  MR 01/27/2021. FINDINGS: Mild diffuse soft tissue swelling. Extensive vascular calcifications are identified. No signs of acute fracture or dislocation. No suspicious bone erosions identified. IMPRESSION: 1. Soft tissue swelling . 2. No radiographic evidence for osteomyelitis. 3. Extensive vascular calcifications. Electronically Signed   By: Signa Kell M.D.   On: 08/01/2021 16:42   MR FOOT LEFT WO CONTRAST  Result Date: 08/02/2021 CLINICAL DATA:  Foot swelling, diabetic,  osteomyelitis suspected. EXAM: MRI OF THE LEFT FOOT WITHOUT CONTRAST TECHNIQUE: Multiplanar, multisequence MR imaging of the left forefoot was performed. No intravenous contrast was administered. COMPARISON:  Radiographs dated Aug 01, 2021 FINDINGS: Bones/Joint/Cartilage Bone marrow edema of the proximal and distal phalanges of the fourth digit. Subchondral cystic changes of the first metatarsophalangeal joint. Small cyst in the distal aspect of the proximal phalanx of the second digit. Ligaments Lisfranc ligament is intact. Collateral ligaments appear maintained, evaluation is limited due to motion. Muscles and Tendons Grease intramuscular signal of the plantar muscles concerning for diabetic myopathy/myositis. No fluid collection or abscess. Soft tissues Marked subcutaneous soft tissue edema and inflammatory changes about the fourth digit and a skin wound over the dorsal aspect of the digit. IMPRESSION: 1. Bone marrow edema of the proximal and distal phalanges of the fourth digit with surrounding inflammatory changes and deep skin wound concerning for acute osteomyelitis. 2. Degenerative changes prominent at the first metatarsophalangeal joint and likely a small cyst in the distal aspect of the proximal phalanx of the second digit. 3. Subcutaneous soft tissue edema and inflammatory changes about the fourth digit. Diabetic myopathy/myositis of the plantar muscles. No drainable fluid collection or abscess. Electronically Signed   By: Larose Hires D.O.   On: 08/02/2021 12:38   MR ANKLE LEFT WO CONTRAST  Result Date: 08/02/2021 CLINICAL DATA:  Foot swelling, diabetic, osteomyelitis suspected, xray done EXAM: MRI OF THE LEFT ANKLE WITHOUT CONTRAST TECHNIQUE: Multiplanar, multisequence MR imaging of the ankle was performed. No intravenous contrast was administered. COMPARISON:  X-ray 08/01/2021. FINDINGS: TENDONS Peroneal: Severe tendinosis of the peroneus longus and brevis tendons with complex tearing of the  inframalleolar aspect of the peroneus brevis tendon. Posteromedial: Distal tibialis posterior tendinosis. The flexor hallucis longus and flexor digitorum longus tendons are intact and normally positioned. Anterior: Tibialis anterior, extensor hallucis longus, and extensor digitorum longus tendons are intact and normally positioned. Achilles: Intact. Plantar Fascia: Intact. LIGAMENTS Lateral: Intact tibiofibular ligaments. The anterior and posterior talofibular ligaments and calcaneofibular ligament are thickened and heterogeneous, but intact. Medial: Heterogeneous appearance of the deltoid ligament. The visualized portions of the spring ligament appear intact. CARTILAGE AND BONES Ankle Joint: Trace  tibiotalar joint effusion. Cartilage thinning with full-thickness fissuring along the anterior aspect of the lateral talar shoulder. Small marginal osteophytes. Large os trigonum preserved marrow signal. Subtalar Joints/Sinus Tarsi: No cartilage defect. No effusion. Loss of the normal anatomic fat signal within the sinus tarsi. Bones: Pes planovalgus alignment. Patchy marrow edema within the body of the calcaneus. No erosion. Preserved fatty T1 marrow signal within the hindfoot. Prominent dorsal spurring of the talar head with bone marrow edema. Small talonavicular joint effusion. No acute fracture or dislocation. Other: Mild soft tissue edema at the lateral aspect of the hindfoot. No fluid collection. IMPRESSION: 1. No convincing evidence of osteomyelitis involving the left ankle or hindfoot. 2. Severe tendinosis of the peroneus longus and brevis tendons with complex tearing of the peroneus brevis tendon. 3. Patchy bone marrow edema within the calcaneal body, which may be stress related or reactive secondary to adjacent peroneal tendinopathy. 4. Prominent dorsal spurring of the talar head with associated bone marrow edema, nonspecific and may be reactive/degenerative. 5. Loss of the normal anatomic fat signal within the  sinus tarsi. Correlate for sinus tarsi syndrome. 6. Distal tibialis posterior tendinosis. 7. Pes planovalgus alignment. Electronically Signed   By: Duanne GuessNicholas  Plundo D.O.   On: 08/02/2021 12:46   US ARTERIAL ABI (SCREENING LOWER EXTREMITY)  Result Date: 08/04/2021 CLINICAL DATA:  Diabetes, osteomyelitis of left fourth toe and status post amputation of left fourth toe. EXAM: NONINVASIVE PHYSIOLOGIC VASCULAR STUDY OF BILATERAL LOWER EXTREMITIES TECHNIQUE: Evaluation of both lower extremities were performed at rest, including calculation of ankle-brachial indices with single level Doppler, pressure and pulse volume recording. COMPARISON:  None Available. FINDINGS: Right ABI:  1.35 Left ABI:  1.07 Right Lower Extremity: Monophasic posterior tibial and triphasic dorsalis pedis waveforms. Left Lower Extremity: Biphasic posterior tibial and monophasic dorsalis pedis waveforms. 1.0-1.4 Normal IMPRESSION: Normal resting ankle-brachial indices. 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 Electronically Signed   By: Irish LackGlenn  Yamagata M.D.   On: 08/04/2021 10:11   DG Foot Complete Left  Result Date: 08/03/2021 CLINICAL DATA:  Fourth digit amputation, postop EXAM: LEFT FOOT - COMPLETE 3+ VIEW COMPARISON:  08/01/2021 FINDINGS: Frontal, oblique, lateral views of the left foot are obtained. Interval amputation of the fourth digit at the level of the metatarsophalangeal joint. Stable osteoarthritis most pronounced within the ankle and hindfoot. Diffuse subcutaneous edema. Atherosclerosis. IMPRESSION: 1. Left fourth digit amputation. 2. Stable osteoarthritis. Electronically Signed   By: Sharlet SalinaMichael  Faizon Capozzi M.D.   On: 08/03/2021 19:54   DG Foot Complete Left  Result Date: 08/01/2021 CLINICAL DATA:  Fourth toe ulceration, oozing, osteomyelitis EXAM: LEFT FOOT - COMPLETE 3+ VIEW COMPARISON:  01/27/2021 FINDINGS: No fracture or  dislocation of the left foot. Ulceration of the tip of the left fourth digit with bony erosion of the tuft of the left fourth distal phalanx. Flatfoot deformity. Vascular calcinosis. IMPRESSION: 1. Ulceration of the tip of the left fourth digit with bony erosion of the tuft of the left fourth distal phalanx, concerning for osteomyelitis. 2. Flatfoot deformity. Electronically Signed   By: Jearld LeschAlex D Bibbey M.D.   On: 08/01/2021 16:42      Assessment/Plan 1.  Atherosclerosis obliterans with ulceration ABIs indicate some level of tibial disease despite normal ABIs.  Correction arterial disease will be important for ongoing wound healing given recent amputation.  We discussed risk, benefits and alternatives and the patient is agreeable to proceed with right lower extremity  angiogram  2.  Diabetes Maintain good glycemic control recommended, as this will be important for the patient while hospitalized to help with wound healing as well as to prevent worsening PAD ongoing.  3.  Osteomyelitis Patient will continue to follow-up with podiatry continue wound treatment and evaluation, following recent fourth amputation  Family Communication: no family at bedside   Georgiana Spinner, NP [facility] [phone] (Office Phone) [phone] (Office Fax)  08/05/2021 10:40 PM    This note was created with Dragon medical transcription system.  Any error is purely unintentional

## 2021-08-05 NOTE — Consult Note (Signed)
[facility] Vascular Consult Note  MRN : [id number]  Cody Cowan is a 56 y.o. ([birth year]) male who presents with chief complaint of  Chief Complaint  Patient presents with   Foot Ulcer  .   Consulting Physician: Reason for consult: History of Present Illness: ***  Current Facility-Administered Medications  Medication Dose Route Frequency Provider Last Rate Last Admin   acetaminophen (TYLENOL) tablet 650 mg  650 mg Oral Q6H PRN Lurline Del, MD       Or   acetaminophen (TYLENOL) suppository 650 mg  650 mg Rectal Q6H PRN Lurline Del, MD       albuterol (PROVENTIL) (2.5 MG/3ML) 0.083% nebulizer solution 2.5 mg  2.5 mg Nebulization Q2H PRN Skip Mayer A, MD       ceFEPIme (MAXIPIME) 2 g in sodium chloride 0.9 % 100 mL IVPB  2 g Intravenous Q8H Amin, Tilman Neat, MD 200 mL/hr at 08/05/21 2108 2 g at 08/05/21 2108   dextrose 50 % solution 0-50 mL  0-50 mL Intravenous PRN Lurline Del, MD       enoxaparin (LOVENOX) injection 40 mg  40 mg Subcutaneous Q24H Skip Mayer A, MD   40 mg at 08/05/21 2108   feeding supplement (NEPRO CARB STEADY) liquid 237 mL  237 mL Oral BID BM Sunnie Nielsen, DO   237 mL at 08/05/21 1506   HYDROcodone-acetaminophen (NORCO/VICODIN) 5-325 MG per tablet 1-2 tablet  1-2 tablet Oral Q4H PRN Lurline Del, MD   1 tablet at 08/05/21 2108   insulin aspart (novoLOG) injection 0-15 Units  0-15 Units Subcutaneous TID WC Lurline Del, MD   15 Units at 08/05/21 1722   insulin aspart (novoLOG) injection 6 Units  6 Units Subcutaneous TID WC Arnetha Courser, MD   6 Units at 08/05/21 1723   insulin glargine-yfgn (SEMGLEE) injection 25 Units  25 Units Subcutaneous BID Arnetha Courser, MD   25 Units at 08/05/21 2112   morphine (PF) 2 MG/ML injection 2 mg  2 mg Intravenous Q4H PRN Lurline Del, MD       multivitamin-lutein (OCUVITE-LUTEIN) capsule 1 capsule  1 capsule Oral Daily Sunnie Nielsen, DO   1 capsule at 08/04/21 0947   ondansetron (ZOFRAN) tablet 4 mg  4 mg Oral Q6H PRN Lurline Del, MD       Or   ondansetron [facility]) injection 4 mg  4 mg Intravenous Q6H PRN Skip Mayer A, MD       sodium chloride flush (NS) 0.9 % injection 3 mL  3 mL Intravenous Q12H Skip Mayer A, MD   3 mL at 08/05/21 2109   vancomycin (VANCOREADY) IVPB 750 mg/150 mL  750 mg Intravenous Q12H Sunnie Nielsen, DO 150 mL/hr at 08/05/21 2205 750 mg at 08/05/21 2205    Past Medical History:  Diagnosis Date   Diabetes [facility])     Past Surgical History:  Procedure Laterality Date   AMPUTATION TOE Left 08/03/2021   Procedure: AMPUTATION TOE 4th digit left toe;  Surgeon: Felecia Shelling, DPM;  Location: [facility] ORS;  Service: Podiatry;  Laterality: Left;   INCISION AND DRAINAGE Left 08/03/2021   Procedure: INCISION AND DRAINAGE LEFT FOOT;  Surgeon: Felecia Shelling, DPM;  Location: [facility] ORS;  Service: Podiatry;  Laterality: Left;    Social History Social History   Tobacco Use   Smoking status: Never   Smokeless tobacco: Never  Substance Use Topics   Alcohol use: Not Currently  Family History History reviewed. No pertinent family history.  No Known Allergies   REVIEW OF SYSTEMS (Negative unless checked)  Constitutional: [] Weight loss  [] Fever  [] Chills Cardiac: [] Chest pain   [] Chest pressure   [] Palpitations   [] Shortness of breath when laying flat   [] Shortness of breath at rest   [] Shortness of breath with exertion. Vascular:  [] Pain in legs with walking   [] Pain in legs at rest   [] Pain in legs when laying flat   [] Claudication   [] Pain in feet when walking  [] Pain in feet at rest  [] Pain in feet when laying flat   [] History of DVT   [] Phlebitis   [] Swelling in legs   [] Varicose veins   [] Non-healing ulcers Pulmonary:   [] Uses home oxygen   [] Productive cough   [] Hemoptysis   [] Wheeze  [] COPD   [] Asthma Neurologic:  [] Dizziness  [] Blackouts   [] Seizures   [] History of  stroke   [] History of TIA  [] Aphasia   [] Temporary blindness   [] Dysphagia   [] Weakness or numbness in arms   [] Weakness or numbness in legs Musculoskeletal:  [] Arthritis   [] Joint swelling   [] Joint pain   [] Low back pain Hematologic:  [] Easy bruising  [] Easy bleeding   [] Hypercoagulable state   [] Anemic  [] Hepatitis Gastrointestinal:  [] Blood in stool   [] Vomiting blood  [] Gastroesophageal reflux/heartburn   [] Difficulty swallowing. Genitourinary:  [] Chronic kidney disease   [] Difficult urination  [] Frequent urination  [] Burning with urination   [] Blood in urine Skin:  [] Rashes   [] Ulcers   [] Wounds Psychological:  [] History of anxiety   []  History of major depression.  Physical Examination  Vitals:   08/05/21 0730 08/05/21 1127 08/05/21 1758 08/05/21 1951  BP: 136/81 (!) 143/86 (!) 148/89 92/65  Pulse: 75 89 93 95  Resp: 16 16 16 17   Temp: 98.6 F (37 C) 97.7 F (36.5 C) 98.6 F (37 C) 98.5 F (36.9 C)  TempSrc:      SpO2: 100% 100% 99% 100%  Weight:       Body mass index is 20.42 kg/m. Gen:  WD/WN, NAD Head: [place]/AT, No temporalis wasting. Prominent temp pulse not noted. Ear/Nose/Throat: Hearing grossly intact, nares w/o erythema or drainage, oropharynx w/o Erythema/Exudate Eyes: Sclera non-icteric, conjunctiva clear Neck: Trachea midline.  No JVD.  Pulmonary:  Good air movement, respirations not labored, equal bilaterally.  Cardiac: RRR, normal S1, S2. Vascular: *** Vessel Right Left  Radial Palpable Palpable  Ulnar Palpable Palpable  Brachial Palpable Palpable  Carotid Palpable, without bruit Palpable, without bruit  Aorta Not palpable N/A  Femoral Palpable Palpable  Popliteal Palpable Palpable  PT Palpable Palpable  DP Palpable Palpable   Gastrointestinal: soft, non-tender/non-distended. No guarding/reflex.  Musculoskeletal: M/S 5/5 throughout.  Extremities without ischemic changes.  No deformity or atrophy. No edema. Neurologic: Sensation grossly intact in  extremities.  Symmetrical.  Speech is fluent. Motor exam as listed above. Psychiatric: Judgment intact, Mood & affect appropriate for pt's clinical situation. Dermatologic: No rashes or ulcers noted.  No cellulitis or open wounds. Lymph : No Cervical, Axillary, or Inguinal lymphadenopathy.    CBC Lab Results  Component Value Date   WBC 8.6 08/03/2021   HGB 12.2 (L) 08/03/2021   HCT 35.7 (L) 08/03/2021   MCV 90.2 08/03/2021   PLT 287 08/03/2021    BMET    Component Value Date/Time   NA 133 (L) 08/05/2021 0411   K 4.7 08/05/2021 0411   CL 101 08/05/2021 0411   CO2  28 08/05/2021 0411   GLUCOSE 331 (H) 08/05/2021 0411   BUN 18 08/05/2021 0411   CREATININE 0.56 (L) 08/05/2021 0411   CALCIUM 9.1 08/05/2021 0411   GFRNONAA >60 08/05/2021 0411   Estimated Creatinine Clearance: 85.7 mL/min (A) (by C-G formula based on SCr of 0.56 mg/dL (L)).  COAG No results found for: INR, PROTIME  Radiology DG Ankle Complete Left  Result Date: 08/01/2021 CLINICAL DATA:  Evaluate for osteomyelitis. EXAM: LEFT ANKLE COMPLETE - 3+ VIEW COMPARISON:  MR 01/27/2021. FINDINGS: Mild diffuse soft tissue swelling. Extensive vascular calcifications are identified. No signs of acute fracture or dislocation. No suspicious bone erosions identified. IMPRESSION: 1. Soft tissue swelling . 2. No radiographic evidence for osteomyelitis. 3. Extensive vascular calcifications. Electronically Signed   By: Signa Kellaylor  Stroud M.D.   On: 08/01/2021 16:42   MR FOOT LEFT WO CONTRAST  Result Date: 08/02/2021 CLINICAL DATA:  Foot swelling, diabetic, osteomyelitis suspected. EXAM: MRI OF THE LEFT FOOT WITHOUT CONTRAST TECHNIQUE: Multiplanar, multisequence MR imaging of the left forefoot was performed. No intravenous contrast was administered. COMPARISON:  Radiographs dated Aug 01, 2021 FINDINGS: Bones/Joint/Cartilage Bone marrow edema of the proximal and distal phalanges of the fourth digit. Subchondral cystic changes of the first  metatarsophalangeal joint. Small cyst in the distal aspect of the proximal phalanx of the second digit. Ligaments Lisfranc ligament is intact. Collateral ligaments appear maintained, evaluation is limited due to motion. Muscles and Tendons Grease intramuscular signal of the plantar muscles concerning for diabetic myopathy/myositis. No fluid collection or abscess. Soft tissues Marked subcutaneous soft tissue edema and inflammatory changes about the fourth digit and a skin wound over the dorsal aspect of the digit. IMPRESSION: 1. Bone marrow edema of the proximal and distal phalanges of the fourth digit with surrounding inflammatory changes and deep skin wound concerning for acute osteomyelitis. 2. Degenerative changes prominent at the first metatarsophalangeal joint and likely a small cyst in the distal aspect of the proximal phalanx of the second digit. 3. Subcutaneous soft tissue edema and inflammatory changes about the fourth digit. Diabetic myopathy/myositis of the plantar muscles. No drainable fluid collection or abscess. Electronically Signed   By: Larose HiresImran  Ahmed D.O.   On: 08/02/2021 12:38   MR ANKLE LEFT WO CONTRAST  Result Date: 08/02/2021 CLINICAL DATA:  Foot swelling, diabetic, osteomyelitis suspected, xray done EXAM: MRI OF THE LEFT ANKLE WITHOUT CONTRAST TECHNIQUE: Multiplanar, multisequence MR imaging of the ankle was performed. No intravenous contrast was administered. COMPARISON:  X-ray 08/01/2021. FINDINGS: TENDONS Peroneal: Severe tendinosis of the peroneus longus and brevis tendons with complex tearing of the inframalleolar aspect of the peroneus brevis tendon. Posteromedial: Distal tibialis posterior tendinosis. The flexor hallucis longus and flexor digitorum longus tendons are intact and normally positioned. Anterior: Tibialis anterior, extensor hallucis longus, and extensor digitorum longus tendons are intact and normally positioned. Achilles: Intact. Plantar Fascia: Intact. LIGAMENTS Lateral:  Intact tibiofibular ligaments. The anterior and posterior talofibular ligaments and calcaneofibular ligament are thickened and heterogeneous, but intact. Medial: Heterogeneous appearance of the deltoid ligament. The visualized portions of the spring ligament appear intact. CARTILAGE AND BONES Ankle Joint: Trace tibiotalar joint effusion. Cartilage thinning with full-thickness fissuring along the anterior aspect of the lateral talar shoulder. Small marginal osteophytes. Large os trigonum preserved marrow signal. Subtalar Joints/Sinus Tarsi: No cartilage defect. No effusion. Loss of the normal anatomic fat signal within the sinus tarsi. Bones: Pes planovalgus alignment. Patchy marrow edema within the body of the calcaneus. No erosion. Preserved fatty T1 marrow signal within  the hindfoot. Prominent dorsal spurring of the talar head with bone marrow edema. Small talonavicular joint effusion. No acute fracture or dislocation. Other: Mild soft tissue edema at the lateral aspect of the hindfoot. No fluid collection. IMPRESSION: 1. No convincing evidence of osteomyelitis involving the left ankle or hindfoot. 2. Severe tendinosis of the peroneus longus and brevis tendons with complex tearing of the peroneus brevis tendon. 3. Patchy bone marrow edema within the calcaneal body, which may be stress related or reactive secondary to adjacent peroneal tendinopathy. 4. Prominent dorsal spurring of the talar head with associated bone marrow edema, nonspecific and may be reactive/degenerative. 5. Loss of the normal anatomic fat signal within the sinus tarsi. Correlate for sinus tarsi syndrome. 6. Distal tibialis posterior tendinosis. 7. Pes planovalgus alignment. Electronically Signed   By: Duanne Guess D.O.   On: 08/02/2021 12:46   US ARTERIAL ABI (SCREENING LOWER EXTREMITY)  Result Date: 08/04/2021 CLINICAL DATA:  Diabetes, osteomyelitis of left fourth toe and status post amputation of left fourth toe. EXAM: NONINVASIVE  PHYSIOLOGIC VASCULAR STUDY OF BILATERAL LOWER EXTREMITIES TECHNIQUE: Evaluation of both lower extremities were performed at rest, including calculation of ankle-brachial indices with single level Doppler, pressure and pulse volume recording. COMPARISON:  None Available. FINDINGS: Right ABI:  1.35 Left ABI:  1.07 Right Lower Extremity: Monophasic posterior tibial and triphasic dorsalis pedis waveforms. Left Lower Extremity: Biphasic posterior tibial and monophasic dorsalis pedis waveforms. 1.0-1.4 Normal IMPRESSION: Normal resting ankle-brachial indices. 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 Electronically Signed   By: Irish Lack M.D.   On: 08/04/2021 10:11   DG Foot Complete Left  Result Date: 08/03/2021 CLINICAL DATA:  Fourth digit amputation, postop EXAM: LEFT FOOT - COMPLETE 3+ VIEW COMPARISON:  08/01/2021 FINDINGS: Frontal, oblique, lateral views of the left foot are obtained. Interval amputation of the fourth digit at the level of the metatarsophalangeal joint. Stable osteoarthritis most pronounced within the ankle and hindfoot. Diffuse subcutaneous edema. Atherosclerosis. IMPRESSION: 1. Left fourth digit amputation. 2. Stable osteoarthritis. Electronically Signed   By: Sharlet Salina M.D.   On: 08/03/2021 19:54   DG Foot Complete Left  Result Date: 08/01/2021 CLINICAL DATA:  Fourth toe ulceration, oozing, osteomyelitis EXAM: LEFT FOOT - COMPLETE 3+ VIEW COMPARISON:  01/27/2021 FINDINGS: No fracture or dislocation of the left foot. Ulceration of the tip of the left fourth digit with bony erosion of the tuft of the left fourth distal phalanx. Flatfoot deformity. Vascular calcinosis. IMPRESSION: 1. Ulceration of the tip of the left fourth digit with bony erosion of the tuft of the left fourth distal phalanx, concerning for osteomyelitis. 2. Flatfoot deformity. Electronically Signed   By:  Jearld Lesch M.D.   On: 08/01/2021 16:42      Assessment/Plan 1.  Atherosclerosis obliterans with ulceration 2.  Diabetes 3. ***  Family Communication: no family at bedside   Georgiana Spinner, NP [facility] [phone] (Office Phone) [phone] (Office Fax)  08/05/2021 10:40 PM    This note was created with Dragon medical transcription system.  Any error is purely unintentional

## 2021-08-05 NOTE — Progress Notes (Signed)
Progress Note   Patient: Cody Cowan JQZ:[id number] DOB: 12-14-1965 DOA: 08/01/2021     4 DOS: the patient was seen and examined on 08/05/2021   Brief hospital course: Taken from prior notes.  Cody Cowan is a 56 y.o. male with medical  history significant for alcohol abuse, pancreatitis, DM2 who presents to ED 08/01/21 with complaint of 4th toe infection- redness and drainage as well as black discoloration, plus chronic ulcer on left foot x 6 months but noted no drainage or changes recently but has intermittent pain. He also endorses stepping on a nail and notes puncture wound above his chronic ulcer.  Started on vancomycin, Zosyn.  MRI with concern of left foot with fourth toe osteomyelitis. MRI of left ankle was negative for any osteomyelitis but did show severe tendinosis of multiple tendons and dorsal spurring-please see the full report.  ABI with some distal waveform abnormalities indicative of some degree of tibial/small vessel disease in the distal lower extremities bilaterally, vascular surgery was also consulted.  Patient was taken to the OR by podiatry on 08/03/2021, s/p fourth toe amputation.  Tolerated the procedure well.  Preliminary cultures with gram-negative rods and gram-positive cocci in pairs. Currently on vancomycin and Zosyn. Zosyn was switched with cefepime, slight worsening of renal function.  6/1: Continue to have hyperglycemia, Semglee was increased to 25 twice daily and mealtime coverage increased to 6 units along with SSI. Vascular surgery is planning to do angiography tomorrow morning. PT is recommending outpatient therapy.   Assessment and Plan: * Osteomyelitis (HCC)  S/p amputation of left fourth toe.  Preliminary cultures with gram-negative rods and gram-positive cocci in pairs.  Switch Zosyn with cefepime  Continue with vancomycin for now-we will de-escalate once susceptibilities available.  ABI with concern of distal PAD,  vascular surgery was consulted and they will do angiography tomorrow.  Uncontrolled type 2 diabetes mellitus with hyperglycemia, without long-term current use of insulin (HCC)  CBG remained elevated.  -Increase Semglee to 25 units twice daily  -Increase mealtime coverage to 6 units  -Continue with SSI  EtOH dependence (HCC) patient states drinks occasionally , last drink 6 days ago he denies history of withdrawals  CIWA protocol     Subjective: Patient was seen and examined today.  Continues to have mild left foot discomfort.  No other new complaints.  Wants to go home.  Physical Exam: Vitals:   08/05/21 0434 08/05/21 0500 08/05/21 0730 08/05/21 1127  BP: 128/88  136/81 (!) 143/86  Pulse: 85  75 89  Resp: 20  16 16   Temp: 97.8 F (36.6 C)  98.6 F (37 C) 97.7 F (36.5 C)  TempSrc:      SpO2: 100%  100% 100%  Weight:  57.4 kg     General.     In no acute distress. Pulmonary.  Lungs clear bilaterally, normal respiratory effort. CV.  Regular rate and rhythm, no JVD, rub or murmur. Abdomen.  Soft, nontender, nondistended, BS positive. CNS.  Alert and oriented .  No focal neurologic deficit. Extremities.  No edema, no cyanosis, pulses intact and symmetrical.  Left foot with Ace wrap Psychiatry.  Judgment and insight appears normal.  Data Reviewed: Prior notes and labs reviewed.  Family Communication:   Disposition: Status is: Inpatient Remains inpatient appropriate because: Severity of illness   Planned Discharge Destination: Home  DVT prophylaxis.  Lovenox Time spent: 40 minutes  This record has been created using . Errors have been sought and  corrected,but may not always be located. Such creation errors do not reflect on the standard of care.  Author: Arnetha Courser, MD 08/05/2021 3:18 PM  For on call review [email or web address].

## 2021-08-05 NOTE — Assessment & Plan Note (Signed)
   CBG remained elevated.  -Increase Semglee to 25 units twice daily  -Increase mealtime coverage to 6 units  -Continue with SSI

## 2021-08-05 NOTE — Progress Notes (Signed)
Physical Therapy Treatment Patient Details Name: Cody Cowan MRN: [id number] DOB: 08-28-1965 Today's Date: 08/05/2021   History of Present Illness Pt is a 56 yo male s/p left 4th toe ambutation with debridement of L foot ulcer. PMH of etoh, DM.    PT Comments    Patient alert, agreeable to PT, translator utilized throughout session. The patient was able to verbalize correct weight bearing status several times during session, as well as the proper technique for stair training. He performed bed mobility modI, and transferred as well as ambulated with supervision and RW. He was able to perform stair training with CGA and proper technique. Questionable adherence to PWB during ambulation, pt encouraged to use his arms for more support, and he was agreeable. The patient would benefit from further skilled PT intervention to continue to progress towards goals. Recommendation remains appropriate.     Recommendations for follow up therapy are one component of a multi-disciplinary discharge planning process, led by the attending physician.  Recommendations may be updated based on patient status, additional functional criteria and insurance authorization.  Follow Up Recommendations  Outpatient PT     Assistance Recommended at Discharge PRN  Patient can return home with the following Assistance with cooking/housework;Assist for transportation;Help with stairs or ramp for entrance   Equipment Recommendations  Rolling walker (2 wheels)    Recommendations for Other Services       Precautions / Restrictions Precautions Precautions: Fall Restrictions Weight Bearing Restrictions: Yes LLE Weight Bearing: Partial weight bearing LLE Partial Weight Bearing Percentage or Pounds: 50     Mobility  Bed Mobility Overal bed mobility: Modified Independent                  Transfers Overall transfer level: Needs assistance Equipment used: Rolling walker (2 wheels) Transfers: Sit  to/from Stand Sit to Stand: Supervision           General transfer comment: cued for hand placement and weight bearing status    Ambulation/Gait   Gait Distance (Feet): 220 Feet Assistive device: Rolling walker (2 wheels)         General Gait Details: cued for PWB throughout with fair carryover   Stairs Stairs: Yes Stairs assistance: Min guard Stair Management: One rail Left, Step to pattern Number of Stairs: 4 General stair comments: able to teach back technique and perform without cueing   Wheelchair Mobility    Modified Rankin (Stroke Patients Only)       Balance Overall balance assessment: Needs assistance Sitting-balance support: Feet supported Sitting balance-Leahy Scale: Good       Standing balance-Leahy Scale: Fair                              Cognition Arousal/Alertness: Awake/alert Behavior During Therapy: WFL for tasks assessed/performed Overall Cognitive Status: Within Functional Limits for tasks assessed                                          Exercises      General Comments        Pertinent Vitals/Pain Pain Assessment Pain Assessment: 0-10 Pain Score: 2  Pain Location: L foot Pain Descriptors / Indicators: Sore Pain Intervention(s): Limited activity within patient's tolerance, Monitored during session, Repositioned    Home Living  Prior Function            PT Goals (current goals can now be found in the care plan section) Progress towards PT goals: Progressing toward goals    Frequency    Min 2X/week      PT Plan Current plan remains appropriate    Co-evaluation              AM-PAC PT "6 Clicks" Mobility   Outcome Measure  Help needed turning from your back to your side while in a flat bed without using bedrails?: None Help needed moving from lying on your back to sitting on the side of a flat bed without using bedrails?: None Help needed  moving to and from a bed to a chair (including a wheelchair)?: None Help needed standing up from a chair using your arms (e.g., wheelchair or bedside chair)?: None Help needed to walk in hospital room?: None Help needed climbing 3-5 steps with a railing? : A Little 6 Click Score: 23    End of Session Equipment Utilized During Treatment: Gait belt Activity Tolerance: Patient tolerated treatment well Patient left: in chair;with call bell/phone within reach Nurse Communication: Mobility status PT Visit Diagnosis: Other abnormalities of gait and mobility (R26.89);Difficulty in walking, not elsewhere classified (R26.2);Muscle weakness (generalized) (M62.81);Pain Pain - Right/Left: Left Pain - part of body: Ankle and joints of foot     Time: 1040-1103 PT Time Calculation (min) (ACUTE ONLY): 23 min  Charges:  $Gait Training: 23-37 mins                     Olga Coaster PT, DPT 1:04 PM,08/05/21

## 2021-08-05 NOTE — Assessment & Plan Note (Signed)
   S/p amputation of left fourth toe.  Preliminary cultures with gram-negative rods and gram-positive cocci in pairs.  Switch Zosyn with cefepime  Continue with vancomycin for now-we will de-escalate once susceptibilities available.  ABI with concern of distal PAD, vascular surgery was consulted and they will do angiography tomorrow.

## 2021-08-05 NOTE — TOC Progression Note (Signed)
Transition of Care [facility]) - Progression Note    Patient Details  Name: Ulices Sasse MRN: [id number] Date of Birth: [birth year]  Transition of Care [facility]) CM/SW [place], RN Phone Number: 08/05/2021, 10:33 AM  Clinical Narrative:   Provided the patient with [facility] application in [place], he understands to call for application appointment    Expected Discharge Plan: OP Rehab Barriers to Discharge: Barriers Resolved  Expected Discharge Plan and Services Expected Discharge Plan: OP Rehab   Discharge Planning Services: CM Consult   Living arrangements for the past 2 months: Single Family Home                 DME Arranged: Walker rolling DME Agency: [employer] Date DME Agency Contacted: 08/04/21 Time DME Agency Contacted: 1000 Representative spoke with at DME Agency: rhonda HH Arranged: NA [place] Agency: NA         Social Determinants of Health ([place]) Interventions    Readmission Risk Interventions     View : No data to display.

## 2021-08-05 NOTE — Plan of Care (Signed)

## 2021-08-06 ENCOUNTER — Encounter
Admission: EM | Disposition: A | Discharge status: Home / Self Care | Payer: Self-pay | Source: Home / Self Care | Attending: Internal Medicine

## 2021-08-06 DIAGNOSIS — E1169 Type 2 diabetes mellitus with other specified complication: Secondary | ICD-10-CM

## 2021-08-06 DIAGNOSIS — E11628 Type 2 diabetes mellitus with other skin complications: Secondary | ICD-10-CM

## 2021-08-06 DIAGNOSIS — E1152 Type 2 diabetes mellitus with diabetic peripheral angiopathy with gangrene: Secondary | ICD-10-CM

## 2021-08-06 DIAGNOSIS — L089 Local infection of the skin and subcutaneous tissue, unspecified: Secondary | ICD-10-CM

## 2021-08-06 DIAGNOSIS — M86172 Other acute osteomyelitis, left ankle and foot: Secondary | ICD-10-CM

## 2021-08-06 DIAGNOSIS — I70262 Atherosclerosis of native arteries of extremities with gangrene, left leg: Secondary | ICD-10-CM

## 2021-08-06 HISTORY — PX: LOWER EXTREMITY ANGIOGRAPHY: CATH118251

## 2021-08-06 LAB — BASIC METABOLIC PANEL
Anion gap: 4 — ABNORMAL LOW (ref 5–15)
BUN: 17 mg/dL (ref 6–20)
CO2: 31 mmol/L (ref 22–32)
Calcium: 9.3 mg/dL (ref 8.9–10.3)
Chloride: 97 mmol/L — ABNORMAL LOW (ref 98–111)
Creatinine, Ser: 0.57 mg/dL — ABNORMAL LOW (ref 0.61–1.24)
GFR, Estimated: >60 mL/min (ref >60)
Glucose, Bld: 381 mg/dL — ABNORMAL HIGH (ref 70–99)
Potassium: 4.8 mmol/L (ref 3.5–5.1)
Sodium: 132 mmol/L — ABNORMAL LOW (ref 135–145)

## 2021-08-06 LAB — GLUCOSE, CAPILLARY
Glucose-Capillary: 307 mg/dL — ABNORMAL HIGH (ref 70–99)
Glucose-Capillary: 329 mg/dL — ABNORMAL HIGH (ref 70–99)
Glucose-Capillary: 135 mg/dL — ABNORMAL HIGH (ref 70–99)
Glucose-Capillary: 155 mg/dL — ABNORMAL HIGH (ref 70–99)
Glucose-Capillary: 102 mg/dL — ABNORMAL HIGH (ref 70–99)
Glucose-Capillary: 363 mg/dL — ABNORMAL HIGH (ref 70–99)

## 2021-08-06 SURGERY — LOWER EXTREMITY ANGIOGRAPHY
Anesthesia: Moderate Sedation | Laterality: Left

## 2021-08-06 MED ORDER — IODIXANOL 320 MG/ML IV SOLN
INTRAVENOUS | Status: DC | PRN
  Administered 2021-08-06: 25 mL

## 2021-08-06 MED ORDER — METHYLPREDNISOLONE SODIUM SUCC 125 MG IJ SOLR: 125.0000 mg | Freq: Once | INTRAMUSCULAR | Status: DC | PRN

## 2021-08-06 MED ORDER — HYDROMORPHONE HCL 1 MG/ML IJ SOLN: 1.0000 mg | Freq: Once | INTRAMUSCULAR | Status: DC | PRN

## 2021-08-06 MED ORDER — MIDAZOLAM HCL 2 MG/2ML IJ SOLN
INTRAMUSCULAR | Status: AC
  Filled 2021-08-06: qty 2

## 2021-08-06 MED ORDER — HEPARIN SODIUM (PORCINE) 1000 UNIT/ML IJ SOLN
INTRAMUSCULAR | Status: AC
  Filled 2021-08-06: qty 10

## 2021-08-06 MED ORDER — MIDAZOLAM HCL 2 MG/ML PO SYRP: 8.0000 mg | ORAL_SOLUTION | Freq: Once | ORAL | Status: DC | PRN

## 2021-08-06 MED ORDER — DIPHENHYDRAMINE HCL 50 MG/ML IJ SOLN: 50.0000 mg | Freq: Once | INTRAMUSCULAR | Status: DC | PRN

## 2021-08-06 MED ORDER — PIPERACILLIN-TAZOBACTAM 3.375 G IVPB
3.3750 g | Freq: Three times a day (TID) | INTRAVENOUS | Status: AC
  Administered 2021-08-06 – 2021-08-08 (×5): 3.375 g via INTRAVENOUS
  Filled 2021-08-06 (×5): qty 50

## 2021-08-06 MED ORDER — INSULIN ASPART 100 UNIT/ML IJ SOLN
8.0000 [IU] | Freq: Three times a day (TID) | INTRAMUSCULAR | Status: DC
  Administered 2021-08-07 – 2021-08-09 (×7): 8 [IU] via SUBCUTANEOUS
  Filled 2021-08-06 (×8): qty 1

## 2021-08-06 MED ORDER — FENTANYL CITRATE (PF) 100 MCG/2ML IJ SOLN
INTRAMUSCULAR | Status: DC | PRN
  Administered 2021-08-06: 50 ug via INTRAVENOUS

## 2021-08-06 MED ORDER — ONDANSETRON HCL 4 MG/2ML IJ SOLN
4.0000 mg | Freq: Four times a day (QID) | INTRAMUSCULAR | Status: DC | PRN
Start: 2021-08-06 — End: 2021-08-09

## 2021-08-06 MED ORDER — INSULIN GLARGINE-YFGN 100 UNIT/ML ~~LOC~~ SOLN
30.0000 [IU] | Freq: Two times a day (BID) | SUBCUTANEOUS | Status: DC
  Administered 2021-08-06 – 2021-08-09 (×6): 30 [IU] via SUBCUTANEOUS
  Filled 2021-08-06 (×7): qty 0.3

## 2021-08-06 MED ORDER — MIDAZOLAM HCL 2 MG/2ML IJ SOLN
INTRAMUSCULAR | Status: DC | PRN
  Administered 2021-08-06: 1 mg via INTRAVENOUS

## 2021-08-06 MED ORDER — SODIUM CHLORIDE 0.9 % IV SOLN: INTRAVENOUS | Status: DC

## 2021-08-06 MED ORDER — FENTANYL CITRATE (PF) 100 MCG/2ML IJ SOLN
INTRAMUSCULAR | Status: AC
  Filled 2021-08-06: qty 2

## 2021-08-06 MED ORDER — FAMOTIDINE 20 MG PO TABS: 40.0000 mg | ORAL_TABLET | Freq: Once | ORAL | Status: DC | PRN

## 2021-08-06 SURGICAL SUPPLY — 8 items
CATH ANGIO 5F PIGTAIL 65CM (CATHETERS) ×1 IMPLANT
COVER PROBE U/S 5X48 (MISCELLANEOUS) ×1 IMPLANT
DEVICE STARCLOSE SE CLOSURE (Vascular Products) ×1 IMPLANT
GLIDEWIRE ADV .035X260CM (WIRE) ×1 IMPLANT
PACK ANGIOGRAPHY (CUSTOM PROCEDURE TRAY) ×2 IMPLANT
SHEATH BRITE TIP 5FRX11 (SHEATH) ×1 IMPLANT
TUBING CONTRAST HIGH PRESS 72 (TUBING) ×1 IMPLANT
WIRE GUIDERIGHT .035X150 (WIRE) ×1 IMPLANT

## 2021-08-06 NOTE — Assessment & Plan Note (Signed)
   CBG remained elevated.  -Increase Semglee to 30 units twice daily  -Increase mealtime coverage to 8 units  -Continue with SSI

## 2021-08-06 NOTE — Consult Note (Signed)
NAME: Cody Cowan  DOB: December 06, 1965  MRN: [id number]  Date/Time: 08/06/2021 6:29 PM  REQUESTING PROVIDER: Dr.Amin Subjective:  REASON FOR CONSULT: Diabetic foot infection ?history thru [employer] video interpreter service Cody Cowan is a 56 y.o. male with a history of diabetes mellitus, hypertriglyceridemia, pancreatitis, peripheral neuropathy, history of alcohol abuse and presented on 08/01/21 with left 4th toe g turning black with some drainage and surrounding erythema a few days duration.  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  He recently also had stepped on a nail and received a puncture wound about the chronic ulcer. He did not have any fever or chills In the ED vitals was BP of 129/91, heart rate 117, respirate 18, sats 96% on room air and temperature 98.7. WBC 8.3, hemoglobin 12.5, platelet 324 and creatinine 0.53. X-ray of the foot showed ulceration of the tip of the left fourth digit with bone erosion.  Flatfoot deformity.  Blood culture was sent and patient was started on broad-spectrum antibiotics of Vanco and cefepime. Podiatry was consulted.  On 08/03/2021 he underwent amputation of the left fourth toe and debridement of the ulcer on the left foot application of ACell wound matrix. He had angio of the left lower extremity and that did not show any significant lesions I am asked to see the patient because the culture from the surgical wound is growing Klebsiella and Enterococcus.  Past Medical History:  Diagnosis Date   Diabetes (HCC)   Pancreatitis Hypertriglyceridemia Alcohol use disorder. Past Surgical History:  Procedure Laterality Date   AMPUTATION TOE Left 08/03/2021   Procedure: AMPUTATION TOE 4th digit left toe;  Surgeon: Felecia ShellingEvans, Brent M, DPM;  Location: [facility] ORS;  Service: Podiatry;  Laterality: Left;   INCISION AND DRAINAGE Left 08/03/2021   Procedure: INCISION AND DRAINAGE LEFT FOOT;  Surgeon:  Felecia ShellingEvans, Brent M, DPM;  Location: [facility] ORS;  Service: Podiatry;  Laterality: Left;    Social History   Socioeconomic History   Marital status: Single    Spouse name: Not on file   Number of children: Not on file   Years of education: Not on file   Highest education level: Not on file  Occupational History   Not on file  Tobacco Use   Smoking status: Never   Smokeless tobacco: Never  Substance and Sexual Activity   Alcohol use: Not Currently   Drug use: Not on file   Sexual activity: Not on file  Other Topics Concern   Not on file  Social History Narrative   Not on file   Social Determinants of Health   Financial Resource Strain: Not on file  Food Insecurity: Not on file  Transportation Needs: Not on file  Physical Activity: Not on file  Stress: Not on file  Social Connections: Not on file  Intimate Partner Violence: Not on file    History reviewed. No pertinent family history. No Known Allergies I? Current Facility-Administered Medications  Medication Dose Route Frequency Provider Last Rate Last Admin   acetaminophen (TYLENOL) tablet 650 mg  650 mg Oral Q6H PRN Lurline Delhomas, Sara-Maiz A, MD       Or   acetaminophen (TYLENOL) suppository 650 mg  650 mg Rectal Q6H PRN Lurline Delhomas, Sara-Maiz A, MD       albuterol (PROVENTIL) (2.5 MG/3ML) 0.083% nebulizer solution 2.5 mg  2.5 mg Nebulization Q2H PRN Lurline Delhomas, Sara-Maiz A, MD       dextrose 50 %  solution 0-50 mL  0-50 mL Intravenous PRN Lurline Del, MD       enoxaparin (LOVENOX) injection 40 mg  40 mg Subcutaneous Q24H Skip Mayer A, MD   40 mg at 08/05/21 2108   feeding supplement (NEPRO CARB STEADY) liquid 237 mL  237 mL Oral BID BM Annice Needy, MD   237 mL at 08/05/21 1506   fentaNYL (SUBLIMAZE) 100 MCG/2ML injection            HYDROcodone-acetaminophen (NORCO/VICODIN) 5-325 MG per tablet 1-2 tablet  1-2 tablet Oral Q4H PRN Lurline Del, MD   1 tablet at 08/05/21 2108   HYDROmorphone (DILAUDID) injection 1 mg  1  mg Intravenous Once PRN Annice Needy, MD       insulin aspart (novoLOG) injection 0-15 Units  0-15 Units Subcutaneous TID WC Lurline Del, MD   11 Units at 08/06/21 0905   insulin aspart (novoLOG) injection 8 Units  8 Units Subcutaneous TID WC Dew, Marlow Baars, MD       insulin glargine-yfgn (SEMGLEE) injection 30 Units  30 Units Subcutaneous BID Annice Needy, MD       midazolam (VERSED) 2 MG/2ML injection            morphine (PF) 2 MG/ML injection 2 mg  2 mg Intravenous Q4H PRN Lurline Del, MD       multivitamin-lutein (OCUVITE-LUTEIN) capsule 1 capsule  1 capsule Oral Daily Annice Needy, MD   1 capsule at 08/04/21 0947   ondansetron (ZOFRAN) tablet 4 mg  4 mg Oral Q6H PRN Lurline Del, MD       Or   ondansetron [facility]) injection 4 mg  4 mg Intravenous Q6H PRN Lurline Del, MD       ondansetron (ZOFRAN) injection 4 mg  4 mg Intravenous Q6H PRN Annice Needy, MD       piperacillin-tazobactam (ZOSYN) IVPB 3.375 g  3.375 g Intravenous Q8H Annice Needy, MD       sodium chloride flush (NS) 0.9 % injection 3 mL  3 mL Intravenous Q12H Skip Mayer A, MD   3 mL at 08/06/21 1019     Abtx:  Anti-infectives (From admission, onward)    Start     Dose/Rate Route Frequency Ordered Stop   08/06/21 1300  piperacillin-tazobactam (ZOSYN) IVPB 3.375 g        3.375 g 12.5 mL/hr over 240 Minutes Intravenous Every 8 hours 08/06/21 1157     08/04/21 1400  ceFEPIme (MAXIPIME) 2 g in sodium chloride 0.9 % 100 mL IVPB  Status:  Discontinued        2 g 200 mL/hr over 30 Minutes Intravenous Every 8 hours 08/04/21 1153 08/06/21 1157   08/02/21 2200  vancomycin (VANCOREADY) IVPB 750 mg/150 mL  Status:  Discontinued       See Hyperspace for full Linked Orders Report.   750 mg 150 mL/hr over 60 Minutes Intravenous Every 12 hours 08/02/21 1308 08/06/21 1157   08/02/21 1315  piperacillin-tazobactam (ZOSYN) IVPB 3.375 g  Status:  Discontinued        3.375 g 12.5 mL/hr over 240 Minutes  Intravenous Every 8 hours 08/02/21 1308 08/04/21 1153   08/02/21 1315  vancomycin (VANCOREADY) IVPB 1250 mg/250 mL       See Hyperspace for full Linked Orders Report.   1,250 mg 166.7 mL/hr over 90 Minutes Intravenous  Once 08/02/21 1308 08/03/21 0805   08/01/21 1630  vancomycin (  VANCOCIN) IVPB 1000 mg/200 mL premix        1,000 mg 200 mL/hr over 60 Minutes Intravenous  Once 08/01/21 1616 08/01/21 1752   08/01/21 1630  piperacillin-tazobactam (ZOSYN) IVPB 3.375 g        3.375 g 100 mL/hr over 30 Minutes Intravenous  Once 08/01/21 1616 08/01/21 1752       REVIEW OF SYSTEMS:  Const: negative fever, negative chills, weight loss Eyes: negative diplopia or visual changes, negative eye pain ENT: negative coryza, negative sore throat Resp: negative cough, hemoptysis, dyspnea Cards: negative for chest pain, palpitations, lower extremity edema GU: negative for frequency, dysuria and hematuria GI: Negative for abdominal pain, diarrhea, bleeding, constipation Skin: negative for rash and pruritus Heme: negative for easy bruising and gum/nose bleeding MS: negative for myalgias, arthralgias, back pain and muscle weakness Neurolo:negative for headaches, dizziness, vertigo, memory problems  Psych: negative for feelings of anxiety, depression  Endocrine:, diabetes Allergy/Immunology- negative for any medication or food allergies ? Objective:  VITALS:  BP (!) 183/118 (BP Location: Left Arm)   Pulse (!) 103   Temp 98 F (36.7 C)   Resp 17   Wt 57.4 kg   SpO2 100%   BMI 20.42 kg/m   PHYSICAL EXAM:  General: Alert, cooperative, no distress, emaciated appears older for his age Head: Normocephalic, without obvious abnormality, atraumatic. Eyes: Conjunctivae clear, anicteric sclerae. Pupils are equal ENT Nares normal. No drainage or sinus tenderness.  Neck: Supple, symmetrical, no adenopathy, thyroid: non tender no carotid bruit and no JVD. Back: No CVA tenderness. Lungs: Clear to  auscultation bilaterally. No Wheezing or Rhonchi. No rales. Heart: Regular rate and rhythm, no murmur, rub or gallop. Abdomen: Soft, non-tender,not distended. Bowel sounds normal. No masses Extremities: Left foot dressing removed Fourth toe amputation site looks clean well approximated.  No erythema or discharge Base of the left foot ulcer is covered with ACell matrix     Skin: No rashes or lesions. Or bruising Lymph: Cervical, supraclavicular normal. Neurologic: Grossly non-focal Pertinent Labs Lab Results CBC    Component Value Date/Time   WBC 8.6 08/03/2021 0409   RBC 3.96 (L) 08/03/2021 0409   HGB 12.2 (L) 08/03/2021 0409   HCT 35.7 (L) 08/03/2021 0409   PLT 287 08/03/2021 0409   MCV 90.2 08/03/2021 0409   MCH 30.8 08/03/2021 0409   MCHC 34.2 08/03/2021 0409   RDW 11.3 (L) 08/03/2021 0409   LYMPHSABS 1.1 08/01/2021 1519   MONOABS 0.6 08/01/2021 1519   EOSABS 0.1 08/01/2021 1519   BASOSABS 0.0 08/01/2021 1519       Latest Ref Rng & Units 08/06/2021    4:01 AM 08/05/2021    4:11 AM 08/04/2021    3:56 AM  CMP  Glucose 70 - 99 mg/dL 161   096   045    BUN 6 - 20 mg/dL Creatinine 0.61 - 1.24 mg/dL 4.09   8.11   9.14    Sodium 135 - 145 mmol/L 132   133   132    Potassium 3.5 - 5.1 mmol/L 4.8   4.7   4.8    Chloride 98 - 111 mmol/L 97   101   97    CO2 22 - 32 mmol/L Calcium 8.9 - 10.3 mg/dL 9.3   9.1   9.1        Microbiology: Recent Results (from the  past 240 hour(s))  Gastrointestinal Panel by PCR , Stool     Status: None   Collection Time: 08/01/21  3:19 PM   Specimen: Stool  Result Value Ref Range Status   Campylobacter species NOT DETECTED NOT DETECTED Final   Plesimonas shigelloides NOT DETECTED NOT DETECTED Final   Salmonella species NOT DETECTED NOT DETECTED Final   Yersinia enterocolitica NOT DETECTED NOT DETECTED Final   Vibrio species NOT DETECTED NOT DETECTED Final   Vibrio cholerae NOT DETECTED NOT DETECTED Final    Enteroaggregative E coli (EAEC) NOT DETECTED NOT DETECTED Final   Enteropathogenic E coli (EPEC) NOT DETECTED NOT DETECTED Final   Enterotoxigenic E coli (ETEC) NOT DETECTED NOT DETECTED Final   Shiga like toxin producing E coli (STEC) NOT DETECTED NOT DETECTED Final   Shigella/Enteroinvasive E coli (EIEC) NOT DETECTED NOT DETECTED Final   Cryptosporidium NOT DETECTED NOT DETECTED Final   Cyclospora cayetanensis NOT DETECTED NOT DETECTED Final   Entamoeba histolytica NOT DETECTED NOT DETECTED Final   Giardia lamblia NOT DETECTED NOT DETECTED Final   Adenovirus F40/41 NOT DETECTED NOT DETECTED Final   Astrovirus NOT DETECTED NOT DETECTED Final   Norovirus GI/GII NOT DETECTED NOT DETECTED Final   Rotavirus A NOT DETECTED NOT DETECTED Final   Sapovirus (I, II, IV, and V) NOT DETECTED NOT DETECTED Final    Comment: Performed at [facility], [address] [place], [place] [address]  C Difficile Quick Screen w PCR reflex     Status: None   Collection Time: 08/01/21  3:19 PM   Specimen: Stool  Result Value Ref Range Status   C Diff antigen NEGATIVE NEGATIVE Final   C Diff toxin NEGATIVE NEGATIVE Final   C Diff interpretation No C. difficile detected.  Final    Comment: Performed at [facility], [address] [place], [place] [address]  Culture, blood (single)     Status: None (Preliminary result)   Collection Time: 08/01/21  3:19 PM   Specimen: BLOOD  Result Value Ref Range Status   Specimen Description BLOOD RIGTH [facility]  Final   Special Requests   Final    BOTTLES DRAWN AEROBIC AND ANAEROBIC Blood Culture adequate volume   Culture   Final    NO GROWTH 4 DAYS Performed at [facility], [address], [place], [place] [address]    Report Status PENDING  Incomplete  Aerobic/Anaerobic Culture w Gram Stain (surgical/deep wound)     Status: None (Preliminary result)   Collection Time: 08/03/21  6:28 PM   Specimen: PATH Other; Tissue  Result Value Ref Range  Status   Specimen Description   Final    TOE LEFT 4TH TOE SWAB Performed at [facility], [address] [place], [place] [address]    Special Requests   Final    NONE Performed at [facility], [address] [place], [place] [address]    Gram Stain   Final    RARE WBC PRESENT,BOTH PMN AND MONONUCLEAR FEW GRAM NEGATIVE RODS FEW GRAM POSITIVE COCCI IN PAIRS    Culture   Final    ABUNDANT KLEBSIELLA PNEUMONIAE ABUNDANT GRAM NEGATIVE RODS MODERATE ENTEROCOCCUS FAECALIS IDENTIFICATION AND SUSCEPTIBILITIES TO FOLLOW HOLDING FOR POSSIBLE ANAEROBE Performed at [facility], [address], [place], [place] [address]    Report Status PENDING  Incomplete    IMAGING RESULTS:  I have personally reviewed the films 4th toe tuft erosion ? 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 Continue zosyn until then  Dm- poorly controlled - A1c is 14.8 On insulin  H/o AUD  H/o pancreatitis  Hypertriglyceridemia  ? ? ___________________________________________________ Discussed with patient, requesting provider Note:  This document was prepared using Dragon voice recognition software and may include unintentional dictation errors.

## 2021-08-06 NOTE — Progress Notes (Signed)
PT Cancellation Note  Patient Details Name: Cody Cowan MRN: [id number] DOB: 08-Feb-1966   Cancelled Treatment:    Reason Eval/Treat Not Completed: Other (comment). Pt out of room at this time, PT to re-attempt as able.   Lieutenant Diego PT, DPT 2:54 PM,08/06/21

## 2021-08-06 NOTE — Interval H&P Note (Signed)
History and Physical Interval Note:  08/06/2021 12:13 PM  Cody Cowan  has presented today for surgery, with the diagnosis of left leg pain.  The various methods of treatment have been discussed with the patient and family. After consideration of risks, benefits and other options for treatment, the patient has consented to  Procedure(s): Lower Extremity Angiography (Left) as a surgical intervention.  The patient's history has been reviewed, patient examined, no change in status, stable for surgery.  I have reviewed the patient's chart and labs.  Questions were answered to the patient's satisfaction.     Festus Barren

## 2021-08-06 NOTE — Op Note (Signed)
[facility]  Percutaneous Study/Intervention Procedural Note   Date of Surgery: 08/06/2021  Surgeon(s):Raydel Hosick    Assistants:none  Pre-operative Diagnosis: PAD with gangrenous changes left foot  Post-operative diagnosis:  Same  Procedure(s) Performed:             1.  Ultrasound guidance for vascular access right femoral artery             2.  Catheter placement into left superficial femoral artery from right femoral approach             3.  Aortogram and selective left lower extremity angiogram             4.  StarClose closure device right femoral artery  EBL: 5 cc  Contrast: 25 cc  Fluoro Time: 1 minute  Moderate Conscious Sedation Time: approximately 16 minutes using 1 mg of Versed and 50 mcg of Fentanyl              Indications:  Patient is a 56 y.o.male with gangrenous changes and digital amputation on the left foot. The patient is brought in for angiography for further evaluation and potential treatment.  Due to the limb threatening nature of the situation, angiogram was performed for attempted limb salvage. The patient is aware that if the procedure fails, amputation would be expected.  The patient also understands that even with successful revascularization, amputation may still be required due to the severity of the situation.  Risks and benefits are discussed and informed consent is obtained.   Procedure:  The patient was identified and appropriate procedural time out was performed.  The patient was then placed supine on the table and prepped and draped in the usual sterile fashion. Moderate conscious sedation was administered during a face to face encounter with the patient throughout the procedure with my supervision of the RN administering medicines and monitoring the patient's vital signs, pulse oximetry, telemetry and mental status throughout from the start of the procedure until the patient was taken to the recovery room. Ultrasound was used to  evaluate the right common femoral artery.  It was patent .  A digital ultrasound image was acquired.  A Seldinger needle was used to access the right common femoral artery under direct ultrasound guidance and a permanent image was performed.  A 0.035 J wire was advanced without resistance and a 5Fr sheath was placed.  Pigtail catheter was placed into the aorta and an AP aortogram was performed. This demonstrated normal renal arteries and normal aorta and iliac segments without significant stenosis. I then crossed the aortic bifurcation and advanced to the left femoral head.  After the initial image of the left common femoral artery, the catheter was then advanced into the mid superficial femoral artery to help opacify distally selective left lower extremity angiogram was then performed. This demonstrated normal common femoral artery, profunda femoris artery, superficial femoral artery, and popliteal arteries.  There is a normal tibial trifurcation with three-vessel runoff and no focal stenosis in the tibial vessels.  There was adequate flow to the foot and no revascularization was required.  I elected to terminate the procedure. The sheath was removed and StarClose closure device was deployed in the right femoral artery with excellent hemostatic result. The patient was taken to the recovery room in stable condition having tolerated the procedure well.  Findings:               Aortogram:  This demonstrated normal renal arteries and normal  aorta and iliac segments without significant stenosis.             Left lower Extremity: This demonstrated normal common femoral artery, profunda femoris artery, superficial femoral artery, and popliteal arteries.  There is a normal tibial trifurcation with three-vessel runoff and no focal stenosis in the tibial vessels.  There was adequate flow to the foot and no revascularization was required.   Disposition: Patient was taken to the recovery room in stable condition having  tolerated the procedure well.  Complications: None  Leotis Pain 08/06/2021 1:48 PM   This note was created with Dragon Medical transcription system. Any errors in dictation are purely unintentional.

## 2021-08-06 NOTE — Progress Notes (Signed)
Progress Note   Patient: Cody Cowan [id number] DOB: 13-Feb-1966 DOA: 08/01/2021     5 DOS: the patient was seen and examined on 08/06/2021   Brief hospital course: Taken from prior notes.  Cody Cowan is a 56 y.o. male with medical  history significant for alcohol abuse, pancreatitis, DM2 who presents to ED 08/01/21 with complaint of 4th toe infection- redness and drainage as well as black discoloration, plus chronic ulcer on left foot x 6 months but noted no drainage or changes recently but has intermittent pain. He also endorses stepping on a nail and notes puncture wound above his chronic ulcer.  Started on vancomycin, Zosyn.  MRI with concern of left foot with fourth toe osteomyelitis. MRI of left ankle was negative for any osteomyelitis but did show severe tendinosis of multiple tendons and dorsal spurring-please see the full report.  ABI with some distal waveform abnormalities indicative of some degree of tibial/small vessel disease in the distal lower extremities bilaterally, vascular surgery was also consulted.  Patient was taken to the OR by podiatry on 08/03/2021, s/p fourth toe amputation.  Tolerated the procedure well.  Preliminary cultures with gram-negative rods and gram-positive cocci in pairs. Currently on vancomycin and Zosyn. Zosyn was switched with cefepime, slight worsening of renal function.  6/1: Continue to have hyperglycemia, Semglee was increased to 25 twice daily and mealtime coverage increased to 6 units along with SSI. Vascular surgery is planning to do angiography tomorrow morning. PT is recommending outpatient therapy.  6/2: CBG remained elevated, Semglee was increased to 30 and mealtime coverage was increased to 8.  Going for angiography with vascular surgery today. Wound cultures with E. coli, Enterobacter fascia, Klebsiella and anaerobes.  Bone margins are not clear and still show signs of osteomyelitis. Antibiotics switched to  Zosyn-Will need long antibiotics. ID consult.   Assessment and Plan: * Osteomyelitis (HCC)  S/p amputation of left fourth toe.  Preliminary cultures with multiple organisms which include E. coli, Klebsiella and anaerobes.  Bone margins with continuation of osteo.  ID consult  Switch back to Zosyn and discontinue vancomycin  ABI with concern of distal PAD, vascular surgery was consulted and they will do angiography today.  Uncontrolled type 2 diabetes mellitus with hyperglycemia, without long-term current use of insulin (HCC)  CBG remained elevated.  -Increase Semglee to 30 units twice daily  -Increase mealtime coverage to 8 units  -Continue with SSI  EtOH dependence (HCC) patient states drinks occasionally , last drink 6 days ago he denies history of withdrawals  CIWA protocol     Subjective: Patient was seen and examined today.  No new complaints.  He was waiting for his procedure.  Physical Exam: Vitals:   08/06/21 0717 08/06/21 1133 08/06/21 1159 08/06/21 1330  BP: 123/64 122/61 107/71   Pulse: 79 77 92   Resp:  17 16   Temp:  98.2 F (36.8 C) 97.8 F (36.6 C)   TempSrc:  Oral Oral   SpO2: 98% 98% 100% 100%  Weight:       General.  Spanish-speaking gentleman, in no acute distress. Pulmonary.  Lungs clear bilaterally, normal respiratory effort. CV.  Regular rate and rhythm, no JVD, rub or murmur. Abdomen.  Soft, nontender, nondistended, BS positive. CNS.  Alert and oriented .  No focal neurologic deficit. Extremities.  No edema, no cyanosis, pulses intact and symmetrical.  Left foot with Ace wrap Psychiatry.  Judgment and insight appears normal.  Data Reviewed: Prior notes and labs reviewed  Family Communication: Discussed with patient  Disposition: Status is: Inpatient Remains inpatient appropriate because: Severity of illness   Planned Discharge Destination: Home with Home Health  DVT prophylaxis.  Lovenox Time spent: 45 minutes  This record  has been created using Conservation officer, historic buildings. Errors have been sought and corrected,but may not always be located. Such creation errors do not reflect on the standard of care.  Author: Arnetha Courser, MD 08/06/2021 1:37 PM  For on call review [email or web address].

## 2021-08-06 NOTE — Progress Notes (Signed)
Subjective: POD # 3 s/p left 4th toe amputation, debridement of foot ulcer with Dr. Amalia Hailey.  He went for angio today and found of adequate flow to the foot.  No revascularization required.  Cultures growing gram-negative rods, Klebsiella, Enterococcus.  Pathology shows necrosis with underlying osteomyelitis.  Articular surface of bone at proximal resection margin with acute osteomyelitis.  Patient states he is feeling well.  No fevers or chills.  Objective: AAO x3, NAD DP/PT pulses palpable, CRT less than 3 seconds Incision intact with sutures intact.  There is minimal edema there is no erythema or warmth.  There are some scabbing, dried blood present.  The ulceration superficial granular wound base.  No fluctuance or crepitation.   No pain with calf compression, swelling, warmth, erythema  Assessment: POD # 3 s/p left fourth toe amputation.  Plan: Clinically incision appears to be healing well.  I did the pathology results.  I did contact Dr. Amalia Hailey who did the surgery.  The toe was disarticulated.  Felt that the proximal margins which the metatarsal remains appeared viable.  The toe was disarticulated.  Can consider further surgery resect part of the metatarsal for further bone biopsy to make sure no residual osteomyelitis however therapeutic amputation of the toe likely due to the amputation.  We will follow-up with infectious disease recommendations.  Encouraged glucose control.  Daily dressing changes.  Podiatry will follow.  Celesta Gentile, DPM

## 2021-08-06 NOTE — Plan of Care (Signed)

## 2021-08-06 NOTE — Assessment & Plan Note (Addendum)
   S/p amputation of left fourth toe.  Preliminary cultures with multiple organisms which include E. coli, Klebsiella and anaerobes.  Bone margins with continuation of osteo.  ID consult  Switch back to Zosyn and discontinue vancomycin  ABI with concern of distal PAD, vascular surgery was consulted and they will do angiography today.

## 2021-08-06 NOTE — Progress Notes (Signed)
Inpatient Diabetes Program Recommendations  AACE/ADA: New Consensus Statement on Inpatient Glycemic Control (2015)  Target Ranges:  Prepandial:   less than 140 mg/dL      Peak postprandial:   less than 180 mg/dL (1-2 hours)      Critically ill patients:  140 - 180 mg/dL   Lab Results  Component Value Date   GLUCAP 308 (H) 08/05/2021   HGBA1C 14.8 (H) 08/01/2021    Review of Glycemic Control  Latest Reference Range & Units 08/05/21 07:31 08/05/21 11:30 08/05/21 16:48 08/05/21 21:11  Glucose-Capillary 70 - 99 mg/dL 109 (H) 323 (H) 557 (H) 308 (H)  (H): Data is abnormally high   Inpatient Diabetes Program Recommendations:   Continued hyperglycemia even with continued increases in insulin doses. -Increase Novolog meal coverage to 8 units tid if eats 50% -Increase Semglee to 30 bid  Thank you, Billy Fischer. Halyn Flaugher, RN, MSN, CDE  Diabetes Coordinator Inpatient Glycemic Control Team Team Pager [phone] (8am-5pm) 08/06/2021 6:56 AM

## 2021-08-07 DIAGNOSIS — I739 Peripheral vascular disease, unspecified: Secondary | ICD-10-CM

## 2021-08-07 LAB — BASIC METABOLIC PANEL
Anion gap: 10 (ref 5–15)
BUN: 18 mg/dL (ref 6–20)
CO2: 28 mmol/L (ref 22–32)
Calcium: 9.6 mg/dL (ref 8.9–10.3)
Chloride: 94 mmol/L — ABNORMAL LOW (ref 98–111)
Creatinine, Ser: 0.65 mg/dL (ref 0.61–1.24)
GFR, Estimated: >60 mL/min (ref >60)
Glucose, Bld: 274 mg/dL — ABNORMAL HIGH (ref 70–99)
Potassium: 4 mmol/L (ref 3.5–5.1)
Sodium: 132 mmol/L — ABNORMAL LOW (ref 135–145)

## 2021-08-07 LAB — C-REACTIVE PROTEIN: CRP: 2.4 mg/dL — ABNORMAL HIGH (ref <1.0)

## 2021-08-07 LAB — HIV ANTIBODY (ROUTINE TESTING W REFLEX): HIV Screen 4th Generation wRfx: NONREACTIVE

## 2021-08-07 LAB — CULTURE, BLOOD (SINGLE)
Culture: NO GROWTH
Special Requests: ADEQUATE

## 2021-08-07 LAB — SEDIMENTATION RATE: Sed Rate: 67 mm/hr — ABNORMAL HIGH (ref 0–20)

## 2021-08-07 LAB — GLUCOSE, CAPILLARY
Glucose-Capillary: 418 mg/dL — ABNORMAL HIGH (ref 70–99)
Glucose-Capillary: 234 mg/dL — ABNORMAL HIGH (ref 70–99)
Glucose-Capillary: 92 mg/dL (ref 70–99)
Glucose-Capillary: 268 mg/dL — ABNORMAL HIGH (ref 70–99)

## 2021-08-07 LAB — AEROBIC/ANAEROBIC CULTURE W GRAM STAIN (SURGICAL/DEEP WOUND)

## 2021-08-07 NOTE — Progress Notes (Signed)
      Daily Progress Note   Assessment/Planning:   POD #1 s/p Ao, LRo  No access complications Normal left leg perfusion   Subjective  - 1 Day Post-Op   No complaints from cannulation site   Objective   Vitals:   08/06/21 1714 08/06/21 1900 08/06/21 1929 08/07/21 0537  BP: (!) 183/118 (!) 145/82 (!) 142/81 (!) 142/92  Pulse: (!) 103 98 93 87  Resp:   17 17  Temp:   98.3 F (36.8 C) 98.5 F (36.9 C)  TempSrc:      SpO2: 100% 99% 100% 100%  Weight:         Intake/Output Summary (Last 24 hours) at 08/07/2021 0908 Last data filed at 08/06/2021 1600 Gross per 24 hour  Intake 480 ml  Output --  Net 480 ml    VASC R groin: no hematoma, no echymosis, no palpable thrill    Laboratory   CBC    Latest Ref Rng & Units 08/03/2021    4:09 AM 08/02/2021    4:09 AM 08/01/2021    3:19 PM  CBC  WBC 4.0 - 10.5 K/uL 8.6   7.4   8.3    Hemoglobin 13.0 - 17.0 g/dL 15.0   56.9   79.4    Hematocrit 39.0 - 52.0 % 35.7   32.8   38.3    Platelets 150 - 400 K/uL 287   248   324      BMET    Component Value Date/Time   NA 132 (L) 08/06/2021 0401   K 4.8 08/06/2021 0401   CL 97 (L) 08/06/2021 0401   CO2 31 08/06/2021 0401   GLUCOSE 381 (H) 08/06/2021 0401   BUN 17 08/06/2021 0401   CREATININE 0.57 (L) 08/06/2021 0401   CALCIUM 9.3 08/06/2021 0401   GFRNONAA >60 08/06/2021 0401     Leonides Sake, MD, FACS, FSVS Covering for [facility]: [phone]  08/07/2021, 9:08 AM

## 2021-08-07 NOTE — Progress Notes (Signed)
Subjective: POD # 4 s/p left 4th toe amputation, debridement of foot ulcer with Dr. Logan Bores.  States he is doing well.  Denies any fevers or chills.  No significant pain.  Objective: AAO x3, NAD DP/PT pulses palpable, CRT less than 3 seconds Incision intact with sutures intact.  There is minimal edema there is no erythema or warmth.  Wounds on the foot otherwise are granular and superficial injuries or pus.  There is no fluctuation crepitation.  There is no pain on exam.  The this time it appears the incision is healing.  No pain with calf compression, swelling, warmth, erythema  Assessment: POD # 4 s/p left fourth toe amputation.  Plan: Clinically incision appears to be healing well.  Continue surgical shoe, weightbearing as tolerated but needs to limit amount of weightbearing.  Recommend antibiotics per infectious disease.  I do think that hopefully the disarticulation of the toe was a therapeutic amputation and no residual osteomyelitis.  I discussed this with Dr. Logan Bores yesterday as well.  Glucose control.  From podiatry standpoint able to be discharged pending antibiotic recommendations.  Ovid Curd, DPM

## 2021-08-07 NOTE — Assessment & Plan Note (Signed)
PAD + gangrenous L foot wounds  - s/p Aortogram and LLE angiogram demonstrated normal arterial flow to foot. Vascular workup conclude not acute need for re-vascularization

## 2021-08-07 NOTE — Progress Notes (Signed)
Progress Note   Patient: Cody Cowan ZOX:[id number] DOB: [birth year] DOA: 08/01/2021     6 DOS: the patient was seen and examined on 08/07/2021   Brief hospital course: Taken from prior notes.  Cody Cowan is a 56 y.o. male with medical  history significant for alcohol abuse, pancreatitis, DM2 who presents to ED 08/01/21 with complaint of 4th toe infection- redness and drainage as well as black discoloration, plus chronic ulcer on left foot x 6 months but noted no drainage or changes recently but has intermittent pain. He also endorses stepping on a nail and notes puncture wound above his chronic ulcer.  Started on vancomycin, Zosyn.  MRI with concern of left foot with fourth toe osteomyelitis. MRI of left ankle was negative for any osteomyelitis but did show severe tendinosis of multiple tendons and dorsal spurring-please see the full report.  ABI with some distal waveform abnormalities indicative of some degree of tibial/small vessel disease in the distal lower extremities bilaterally, vascular surgery was also consulted.  Patient was taken to the OR by podiatry on 08/03/2021, s/p fourth toe amputation.  Tolerated the procedure well.  Preliminary cultures with gram-negative rods and gram-positive cocci in pairs. Currently on vancomycin and Zosyn. Zosyn was switched with cefepime, slight worsening of renal function.  6/1: Continue to have hyperglycemia, Semglee was increased to 25 twice daily and mealtime coverage increased to 6 units along with SSI. Vascular surgery is planning to do angiography tomorrow morning. PT is recommending outpatient therapy.  6/2: CBG remained elevated, Semglee was increased to 30 and mealtime coverage was increased to 8.  Going for angiography with vascular surgery today. Wound cultures with E. coli, Enterobacter fascia, Klebsiella and anaerobes.  Bone margins are not clear and still show signs of osteomyelitis. Antibiotics switched to  Zosyn-Will need long antibiotics. ID consulted  6/3 s/p Aortogram LLE angiogram without stenting/revascularization. He reports pain is improving.   Progressing   Assessment and Plan: * Osteomyelitis (HCC) S/p amputation of left fourth toe.  Preliminary cultures with multiple organisms which include E. coli, Klebsiella and anaerobes.  Bone margins with continuation of osteo. ID consult Switch back to Zosyn and discontinue vancomycin ABI with concern of distal PAD, vascular surgery was consulted and they will do angiography today.  Uncontrolled type 2 diabetes mellitus with hyperglycemia, without long-term current use of insulin (HCC) CBG remained elevated. -Increase Semglee to 30 units twice daily -Increase mealtime coverage to 8 units -Continue with SSI  EtOH dependence (HCC) patient states drinks occasionally , last drink 6 days ago he denies history of withdrawals CIWA protocol  Peripheral artery disease (HCC) PAD + gangrenous L foot wounds  - s/p Aortogram and LLE angiogram demonstrated normal arterial flow to foot. Vascular workup conclude not acute need for re-vascularization     Subjective: Patient new to me, underwent angioplasty yesterday, appreciate Vascular management. Pain improving. He had no significant events overnight. BG remain elevated will adjust insulin reigmen   Physical Exam: Vitals:   08/06/21 1900 08/06/21 1929 08/07/21 0537 08/07/21 0959  BP: (!) 145/82 (!) 142/81 (!) 142/92 107/71  Pulse: 98 93 87 87  Resp:  17 17 16   Temp:  98.3 F (36.8 C) 98.5 F (36.9 C) 98.6 F (37 C)  TempSrc:      SpO2: 99% 100% 100% 100%  Weight:       General.  Spanish-speaking gentleman, in no acute distress. Pulmonary.  Lungs clear bilaterally, normal respiratory effort. CV.  Regular rate and rhythm,  no JVD, rub or murmur. Abdomen.  Soft, nontender, nondistended, BS positive. CNS.  Alert and oriented .  No focal neurologic deficit. Extremities.  No edema, no  cyanosis, pulses intact and symmetrical.  Left foot with Ace wrap Psychiatry.  Judgment and insight appears normal.  Data Reviewed: Prior notes and labs reviewed  Family Communication: Discussed with patient  Disposition: Status is: Inpatient Remains inpatient appropriate because: Severity of illness   Planned Discharge Destination: Home with Home Health  DVT prophylaxis.  Lovenox Time spent: 45 minutes  This record has been created using Conservation officer, historic buildings. Errors have been sought and corrected,but may not always be located. Such creation errors do not reflect on the standard of care.  Author: Thomas Hoff, MD 08/07/2021 11:43 AM  For on call review [email or web address].

## 2021-08-07 NOTE — Plan of Care (Signed)

## 2021-08-08 LAB — GLUCOSE, CAPILLARY
Glucose-Capillary: 248 mg/dL — ABNORMAL HIGH (ref 70–99)
Glucose-Capillary: 331 mg/dL — ABNORMAL HIGH (ref 70–99)
Glucose-Capillary: 443 mg/dL — ABNORMAL HIGH (ref 70–99)
Glucose-Capillary: 225 mg/dL — ABNORMAL HIGH (ref 70–99)

## 2021-08-08 LAB — CREATININE, SERUM
Creatinine, Ser: 0.59 mg/dL — ABNORMAL LOW (ref 0.61–1.24)
GFR, Estimated: >60 mL/min (ref >60)

## 2021-08-08 MED ORDER — AMOXICILLIN-POT CLAVULANATE 875-125 MG PO TABS: 1.0000 | ORAL_TABLET | Freq: Two times a day (BID) | ORAL | 0 refills | Status: AC

## 2021-08-08 MED ORDER — SODIUM CHLORIDE 0.9 % IV SOLN
3.0000 g | Freq: Four times a day (QID) | INTRAVENOUS | Status: DC
  Administered 2021-08-08 – 2021-08-09 (×4): 3 g via INTRAVENOUS
  Filled 2021-08-08 (×3): qty 8
  Filled 2021-08-08: qty 3
  Filled 2021-08-08 (×3): qty 8

## 2021-08-08 NOTE — Progress Notes (Signed)
Subjective: POD # 5 s/p left 4th toe amputation, debridement of foot ulcer with Dr. Logan Bores.  States he is doing well.  Not had any significant pain.  No fevers or chills.  Objective: AAO x3, NAD DP/PT pulses palpable, CRT less than 3 seconds Incision intact with sutures intact.  Incision coapted with sutures intact.  No signs of dehiscence is noted.  Trace edema but there is no erythema or warmth.  No fluctuance or crepitation.  Superficial wounds present plantar aspect the foot.  No drainage or pus.  No significant signs of cellulitis. No pain with calf compression, swelling, warmth, erythema       Assessment: POD # 5 s/p left fourth toe amputation.  Plan: Clinically incision appears to be healing well.  Continue surgical shoe, weightbearing as tolerated but needs to limit amount of weightbearing.  Recommend antibiotics per infectious disease.  I do think that hopefully the disarticulation of the toe was a therapeutic amputation and no residual osteomyelitis.  I discussed this with Dr. Logan Bores yesterday as well.  Discussed importance of glucose control to help facilitate healing.  From podiatry standpoint able to be discharged pending antibiotic recommendations of follow-up in the office.  Ovid Curd, DPM

## 2021-08-08 NOTE — Progress Notes (Addendum)
Progress Note   Patient: Cody Cowan EXB:[id number] DOB: March 19, 1965 DOA: 08/01/2021     7 DOS: the patient was seen and examined on 08/08/2021   Brief hospital course: Taken from prior notes.  [name] is a 56 y.o. male with medical  history significant for alcohol abuse, pancreatitis, DM2 who presents to ED 08/01/21 with complaint of 4th toe infection- redness and drainage as well as black discoloration, plus chronic ulcer on left foot x 6 months but noted no drainage or changes recently but has intermittent pain. He also endorses stepping on a nail and notes puncture wound above his chronic ulcer.  Started on vancomycin, Zosyn.  MRI with concern of left foot with fourth toe osteomyelitis. MRI of left ankle was negative for any osteomyelitis but did show severe tendinosis of multiple tendons and dorsal spurring-please see the full report.  ABI with some distal waveform abnormalities indicative of some degree of tibial/small vessel disease in the distal lower extremities bilaterally, vascular surgery was also consulted.  Patient was taken to the OR by podiatry on 08/03/2021, s/p fourth toe amputation.  Tolerated the procedure well.  Preliminary cultures with gram-negative rods and gram-positive cocci in pairs. Currently on vancomycin and Zosyn. Zosyn was switched with cefepime, slight worsening of renal function.  6/1: Continue to have hyperglycemia, Semglee was increased to 25 twice daily and mealtime coverage increased to 6 units along with SSI. Vascular surgery is planning to do angiography tomorrow morning. PT is recommending outpatient therapy.  6/2: CBG remained elevated, Semglee was increased to 30 and mealtime coverage was increased to 8.  Going for angiography with vascular surgery today. Wound cultures with E. coli, Enterobacter fascia, Klebsiella and anaerobes.  Bone margins are not clear and still show signs of osteomyelitis. Antibiotics switched to  Zosyn-Will need long antibiotics. ID consulted  6/3 s/p Aortogram LLE angiogram without stenting/revascularization. He reports pain is improving.   Progressing   Assessment and Plan: * Osteomyelitis (HCC) S/p amputation of left fourth toe.  Preliminary cultures with multiple organisms which include E. coli, Klebsiella and anaerobes.  Bone margins with continuation of osteo. ID consult Switch back to Zosyn and discontinue vancomycin ABI with concern of distal PAD, vascular surgery was consulted and they will do angiography today.  Uncontrolled type 2 diabetes mellitus with hyperglycemia, without long-term current use of insulin (HCC) CBG remained elevated. -Increase Semglee to 30 units twice daily -Increase mealtime coverage to 8 units -Continue with SSI  EtOH dependence (HCC) patient states drinks occasionally , last drink 6 days ago he denies history of withdrawals CIWA protocol  Peripheral artery disease (HCC) PAD + gangrenous L foot wounds  - s/p Aortogram and LLE angiogram demonstrated normal arterial flow to foot. Vascular workup conclude not acute need for re-vascularization     Subjective: NAEON Doing well. Podiatry feels patient has good trajectory post operatively and wounds starting to heal, cleared for DC pending Abx recommendations per ID. We will need to DC in AM after d/w weekend CCM. Patient unable to afford medications and very high risk for readmission given comorbidity. Plan to dc after reaching out to medical assistance program tomorrow.   Physical Exam: Vitals:   08/08/21 0500 08/08/21 0618 08/08/21 0722 08/08/21 1145  BP:  111/74 114/76 93/61  Pulse:  85 88 93  Resp:  18 16 16   Temp:  98 F (36.7 C) 97.9 F (36.6 C) 98.6 F (37 C)  TempSrc:      SpO2:  100% 99%  100%  Weight: 55.1 kg      General.  Spanish-speaking gentleman, in no acute distress. Pulmonary.  Lungs clear bilaterally, normal respiratory effort. CV.  Regular rate and rhythm, no  JVD, rub or murmur. Abdomen.  Soft, nontender, nondistended, BS positive. CNS.  Alert and oriented .  No focal neurologic deficit. Extremities.  No edema, no cyanosis, pulses intact and symmetrical.  Left foot with Ace wrap Psychiatry.  Judgment and insight appears normal.  Data Reviewed: Prior notes and labs reviewed  Family Communication: Discussed with patient  Disposition: Status is: Inpatient Remains inpatient appropriate because: Severity of illness   Planned Discharge Destination: Home with Home Health 24H  DVT prophylaxis.  Lovenox Time spent: 45 minutes  This record has been created using Conservation officer, historic buildings. Errors have been sought and corrected,but may not always be located. Such creation errors do not reflect on the standard of care.  Author: Thomas Hoff, MD 08/08/2021 12:23 PM  For on call review [email or web address].

## 2021-08-08 NOTE — Plan of Care (Signed)

## 2021-08-09 ENCOUNTER — Other Ambulatory Visit: Payer: Self-pay

## 2021-08-09 ENCOUNTER — Encounter: Payer: Self-pay | Admitting: Vascular Surgery

## 2021-08-09 DIAGNOSIS — E11621 Type 2 diabetes mellitus with foot ulcer: Secondary | ICD-10-CM

## 2021-08-09 LAB — GLUCOSE, CAPILLARY
Glucose-Capillary: 136 mg/dL — ABNORMAL HIGH (ref 70–99)
Glucose-Capillary: 252 mg/dL — ABNORMAL HIGH (ref 70–99)

## 2021-08-09 MED ORDER — BASAGLAR KWIKPEN 100 UNIT/ML ~~LOC~~ SOPN
30.0000 [IU] | PEN_INJECTOR | Freq: Two times a day (BID) | SUBCUTANEOUS | 11 refills | Status: DC
  Filled 2021-08-09: qty 15, 25d supply, fill #0

## 2021-08-09 MED ORDER — UNIFINE PENTIPS 32G X 4 MM MISC
30.0000 [IU] | Freq: Two times a day (BID) | 1 refills | Status: DC
Start: 2021-08-09 — End: 2022-01-06
  Filled 2021-08-09: qty 100, 90d supply, fill #0

## 2021-08-09 MED ORDER — METFORMIN HCL ER 500 MG PO TB24
500.0000 mg | ORAL_TABLET | Freq: Two times a day (BID) | ORAL | 1 refills | Status: DC
  Filled 2021-08-09: qty 60, 30d supply, fill #0

## 2021-08-09 MED ORDER — INSULIN STARTER KIT- SYRINGES (SPANISH)
1.0000 | Freq: Once | 0 refills | Status: AC
  Filled 2021-08-09: qty 1, 1d supply, fill #0

## 2021-08-09 MED ORDER — INSULIN ASPART 100 UNIT/ML IJ SOLN
10.0000 [IU] | Freq: Three times a day (TID) | INTRAMUSCULAR | Status: DC
  Administered 2021-08-09: 10 [IU] via SUBCUTANEOUS

## 2021-08-09 NOTE — Progress Notes (Addendum)
Inpatient Diabetes Program Recommendations  AACE/ADA: New Consensus Statement on Inpatient Glycemic Control   Target Ranges:  Prepandial:   less than 140 mg/dL      Peak postprandial:   less than 180 mg/dL (1-2 hours)      Critically ill patients:  140 - 180 mg/dL    Latest Reference Range & Units 08/08/21 07:24 08/08/21 11:46 08/08/21 17:08 08/08/21 20:58  Glucose-Capillary 70 - 99 mg/dL 248 (H) 331 (H) 443 (H) 225 (H)   Review of Glycemic Control  Diabetes history: DM Outpatient Diabetes medications: Humalog 75/25 30 units BID, Metformin 500 mg BID Current orders for Inpatient glycemic control: Semglee 30 units BID, Novolog 8 units TID with meals, Novolog 0-15 units TID with meals  Inpatient Diabetes Program Recommendations:    Insulin: Please consider changing Semglee to 35 units QAM and Semglee 30 units QHS; also increase meal coverage to Novolog 12 units TID with meals.  Thanks, Barnie Alderman, RN, MSN, [place] Diabetes Coordinator Inpatient Diabetes Program [phone] (Team Pager from 8am to 5pm)'

## 2021-08-09 NOTE — TOC Progression Note (Signed)
Transition of Care [facility]) - Progression Note    Patient Details  Name: Cody Cowan MRN: [id number] Date of Birth: 11-Apr-1965  [facility] CM/SW Contact  Marlowe Sax, RN Phone Number: 08/09/2021, 10:04 AM  Clinical Narrative:    Using the electronic interpreter for Spanish, I provided the patient with the [facility] Attestation form and requested that he fill it out, I will pick it up afterwards to provide to [facility] for him to be able to obtain medication at no cost   Expected Discharge Plan: OP Rehab Barriers to Discharge: Barriers Resolved  Expected Discharge Plan and Services Expected Discharge Plan: OP Rehab   Discharge Planning Services: CM Consult   Living arrangements for the past 2 months: Single Family Home                 DME Arranged: Walker rolling DME Agency: [employer] Date DME Agency Contacted: 08/04/21 Time DME Agency Contacted: 1000 Representative spoke with at DME Agency: rhonda HH Arranged: NA HH Agency: NA         Social Determinants of Health (SDOH) Interventions    Readmission Risk Interventions     View : No data to display.

## 2021-08-09 NOTE — Discharge Summary (Signed)
Physician Discharge Summary   Patient: Cody Cowan MRN: [id number] DOB: 1965-11-08  Admit date:     08/01/2021  Discharge date: 08/09/21  Discharge Physician: Lorella Nimrod   PCP: Patient, No Pcp Per (Inactive)   Recommendations at discharge:  Please obtain CBC and BMP in 1 week Follow-up with podiatry Follow-up with infectious disease  Discharge Diagnoses: Principal Problem:   Osteomyelitis ([place]) Active Problems:   Uncontrolled type 2 diabetes mellitus with hyperglycemia, without long-term current use of insulin ([place])   EtOH dependence ([place])   Peripheral artery disease [facility])  Hospital Course: Taken from prior notes.  Cody Cowan is a 56 y.o. male with medical  history significant for alcohol abuse, pancreatitis, DM2 who presents to ED 08/01/21 with complaint of 4th toe infection- redness and drainage as well as black discoloration, plus chronic ulcer on left foot x 6 months but noted no drainage or changes recently but has intermittent pain. He also endorses stepping on a nail and notes puncture wound above his chronic ulcer.  Started on vancomycin, Zosyn.  MRI with concern of left foot with fourth toe osteomyelitis. MRI of left ankle was negative for any osteomyelitis but did show severe tendinosis of multiple tendons and dorsal spurring-please see the full report.  ABI with some distal waveform abnormalities indicative of some degree of tibial/small vessel disease in the distal lower extremities bilaterally, vascular surgery was also consulted.  Patient was taken to the OR by podiatry on 08/03/2021, s/p fourth toe amputation.  Tolerated the procedure well.  Preliminary cultures with gram-negative rods and gram-positive cocci in pairs. Currently on vancomycin and Zosyn. Zosyn was switched with cefepime, slight worsening of renal function.  6/1: Continue to have hyperglycemia, Semglee was increased to 25 twice daily and mealtime coverage increased to 6  units along with SSI. Vascular surgery is planning to do angiography tomorrow morning. PT is recommending outpatient therapy.  6/2: CBG remained elevated, Semglee was increased to 30 and mealtime coverage was increased to 8.  Going for angiography with vascular surgery today. Wound cultures with E. coli, Enterobacter fascia, Klebsiella and anaerobes.  Bone margins are not clear and still show signs of osteomyelitis. Antibiotics switched to Zosyn-Will need long antibiotics. ID consulted  6/3 s/p Aortogram LLE angiogram without stenting/revascularization. He reports pain is improving.    6/4: Bone and intraoperative cultures with Klebsiella pneumoniae, E. coli and Enterococcus faecalis.  6/5: Antibiotics were switched to Unasyn by ID and he is being discharged on 2 weeks of Augmentin.  Patient will follow-up with podiatry and infectious disease. He will continue current medication and follow-up with his providers.  Assessment and Plan: * Osteomyelitis ([place]) S/p amputation of left fourth toe.  Preliminary cultures with multiple organisms which include E. coli, Klebsiella and anaerobes.  Bone margins with continuation of osteo. ID consult Switch back to Zosyn and discontinue vancomycin ABI with concern of distal PAD, vascular surgery was consulted and they will do angiography today.  Uncontrolled type 2 diabetes mellitus with hyperglycemia, without long-term current use of insulin ([facility]) CBG remained elevated. -Increase Semglee to 30 units twice daily -Increase mealtime coverage to 8 units -Continue with SSI  EtOH dependence ([place]) patient states drinks occasionally , last drink 6 days ago he denies history of withdrawals CIWA protocol  Peripheral artery disease ([facility]) PAD + gangrenous L foot wounds  - s/p Aortogram and LLE angiogram demonstrated normal arterial flow to foot. Vascular workup conclude not acute need for re-vascularization     Pain  control - [employer] Controlled  Substance Reporting System database was reviewed. and patient was instructed, not to drive, operate heavy machinery, perform activities at heights, swimming or participation in water activities or provide baby-sitting services while on Pain, Sleep and Anxiety Medications; until their outpatient Physician has advised to do so again. Also recommended to not to take more than prescribed Pain, Sleep and Anxiety Medications.  Consultants: Podiatry, vascular surgery, infectious disease Procedures performed: Toe amputation.  Angiography Disposition: Home Diet recommendation:  Discharge Diet Orders (From admission, onward)     Start     Ordered   08/09/21 0000  Diet - low sodium heart healthy        08/09/21 1225           Cardiac and Carb modified diet DISCHARGE MEDICATION: Allergies as of 08/09/2021   No Known Allergies      Medication List     STOP taking these medications    HumaLOG Mix 75/25 KwikPen (75-25) 100 UNIT/ML Kwikpen Generic drug: Insulin Lispro Prot & Lispro   multivitamin with minerals Tabs tablet       TAKE these medications    amoxicillin-clavulanate 875-125 MG tablet Commonly known as: AUGMENTIN Take 1 tablet by mouth 2 (two) times daily for 14 days.   atorvastatin 40 MG tablet Commonly known as: Lipitor Tome 1 tableta (40 mg en total) por va oral diariamente. (Take 1 tablet (40 mg total) by mouth daily.)   Basaglar KwikPen 100 UNIT/ML Inject 30 Units into the skin 2 (two) times daily.   fenofibrate 145 MG tablet Commonly known as: TRICOR Tome 1 tableta (145 mg en total) por va oral diariamente (Take 1 tablet (145 mg total) by mouth daily.)   insulin starter kit- syringes Misc 1 kit by Other route once for 1 dose.   metFORMIN 500 MG 24 hr tablet Commonly known as: GLUCOPHAGE-XR Tome 1 tableta por va oral 2 veces al da con una comida (Take 1 tablet (500 mg total) by mouth 2 (two) times daily with a meal.)   Unifine Pentips 32G X 4 MM  Misc Generic drug: Insulin Pen Needle use with insulin pens               Durable Medical Equipment  (From admission, onward)           Start     Ordered   08/04/21 1315  For home use only DME Walker rolling  Once       Question Answer Comment  Walker: With [place]   Patient needs a walker to treat with the following condition Difficulty walking      08/04/21 1315              Discharge Care Instructions  (From admission, onward)           Start     Ordered   08/09/21 0000  Leave dressing on - Keep it clean, dry, and intact until clinic visit        08/09/21 1225            Follow-up Information     Trula Slade, DPM. Go on 08/23/2021.   Specialty: Podiatry Why: Appt @ 10:00 a,m Self Pay is $200 if have NO Insurance; otherwise bring Data processing manager information: [address] [place] [address] [phone]         Tsosie Billing, MD Follow up in 2 week(s).   Specialty: Infectious Diseases Why: Office  will call Patient w/ Appt Contact information: [address] [place] [address] [phone]         Create PCP appt for patient please Follow up.                 Discharge Exam: Filed Weights   08/05/21 0500 08/08/21 0500 08/09/21 0500  Weight: 57.4 kg 55.1 kg 55.5 kg   General.     In no acute distress. Pulmonary.  Lungs clear bilaterally, normal respiratory effort. CV.  Regular rate and rhythm, no JVD, rub or murmur. Abdomen.  Soft, nontender, nondistended, BS positive. CNS.  Alert and oriented .  No focal neurologic deficit. Extremities.  No edema, no cyanosis, pulses intact and symmetrical. Psychiatry.  Judgment and insight appears normal.   Condition at discharge: stable  The results of significant diagnostics from this hospitalization (including imaging, microbiology, ancillary and laboratory) are listed below for reference.   Imaging Studies: DG Ankle  Complete Left  Result Date: 08/01/2021 CLINICAL DATA:  Evaluate for osteomyelitis. EXAM: LEFT ANKLE COMPLETE - 3+ VIEW COMPARISON:  MR 01/27/2021. FINDINGS: Mild diffuse soft tissue swelling. Extensive vascular calcifications are identified. No signs of acute fracture or dislocation. No suspicious bone erosions identified. IMPRESSION: 1. Soft tissue swelling . 2. No radiographic evidence for osteomyelitis. 3. Extensive vascular calcifications. Electronically Signed   By: Kerby Moors M.D.   On: 08/01/2021 16:42   MR FOOT LEFT WO CONTRAST  Result Date: 08/02/2021 CLINICAL DATA:  Foot swelling, diabetic, osteomyelitis suspected. EXAM: MRI OF THE LEFT FOOT WITHOUT CONTRAST TECHNIQUE: Multiplanar, multisequence MR imaging of the left forefoot was performed. No intravenous contrast was administered. COMPARISON:  Radiographs dated Aug 01, 2021 FINDINGS: Bones/Joint/Cartilage Bone marrow edema of the proximal and distal phalanges of the fourth digit. Subchondral cystic changes of the first metatarsophalangeal joint. Small cyst in the distal aspect of the proximal phalanx of the second digit. Ligaments Lisfranc ligament is intact. Collateral ligaments appear maintained, evaluation is limited due to motion. Muscles and Tendons Grease intramuscular signal of the plantar muscles concerning for diabetic myopathy/myositis. No fluid collection or abscess. Soft tissues Marked subcutaneous soft tissue edema and inflammatory changes about the fourth digit and a skin wound over the dorsal aspect of the digit. IMPRESSION: 1. Bone marrow edema of the proximal and distal phalanges of the fourth digit with surrounding inflammatory changes and deep skin wound concerning for acute osteomyelitis. 2. Degenerative changes prominent at the first metatarsophalangeal joint and likely a small cyst in the distal aspect of the proximal phalanx of the second digit. 3. Subcutaneous soft tissue edema and inflammatory changes about the fourth  digit. Diabetic myopathy/myositis of the plantar muscles. No drainable fluid collection or abscess. Electronically Signed   By: Keane Police D.O.   On: 08/02/2021 12:38   MR ANKLE LEFT WO CONTRAST  Result Date: 08/02/2021 CLINICAL DATA:  Foot swelling, diabetic, osteomyelitis suspected, xray done EXAM: MRI OF THE LEFT ANKLE WITHOUT CONTRAST TECHNIQUE: Multiplanar, multisequence MR imaging of the ankle was performed. No intravenous contrast was administered. COMPARISON:  X-ray 08/01/2021. FINDINGS: TENDONS Peroneal: Severe tendinosis of the peroneus longus and brevis tendons with complex tearing of the inframalleolar aspect of the peroneus brevis tendon. Posteromedial: Distal tibialis posterior tendinosis. The flexor hallucis longus and flexor digitorum longus tendons are intact and normally positioned. Anterior: Tibialis anterior, extensor hallucis longus, and extensor digitorum longus tendons are intact and normally positioned. Achilles: Intact. Plantar Fascia: Intact. LIGAMENTS Lateral: Intact tibiofibular ligaments. The anterior and posterior  talofibular ligaments and calcaneofibular ligament are thickened and heterogeneous, but intact. Medial: Heterogeneous appearance of the deltoid ligament. The visualized portions of the spring ligament appear intact. CARTILAGE AND BONES Ankle Joint: Trace tibiotalar joint effusion. Cartilage thinning with full-thickness fissuring along the anterior aspect of the lateral talar shoulder. Small marginal osteophytes. Large os trigonum preserved marrow signal. Subtalar Joints/Sinus Tarsi: No cartilage defect. No effusion. Loss of the normal anatomic fat signal within the sinus tarsi. Bones: Pes planovalgus alignment. Patchy marrow edema within the body of the calcaneus. No erosion. Preserved fatty T1 marrow signal within the hindfoot. Prominent dorsal spurring of the talar head with bone marrow edema. Small talonavicular joint effusion. No acute fracture or dislocation. Other:  Mild soft tissue edema at the lateral aspect of the hindfoot. No fluid collection. IMPRESSION: 1. No convincing evidence of osteomyelitis involving the left ankle or hindfoot. 2. Severe tendinosis of the peroneus longus and brevis tendons with complex tearing of the peroneus brevis tendon. 3. Patchy bone marrow edema within the calcaneal body, which may be stress related or reactive secondary to adjacent peroneal tendinopathy. 4. Prominent dorsal spurring of the talar head with associated bone marrow edema, nonspecific and may be reactive/degenerative. 5. Loss of the normal anatomic fat signal within the sinus tarsi. Correlate for sinus tarsi syndrome. 6. Distal tibialis posterior tendinosis. 7. Pes planovalgus alignment. Electronically Signed   By: Davina Poke D.O.   On: 08/02/2021 12:46   PERIPHERAL VASCULAR CATHETERIZATION  Result Date: 08/06/2021 See surgical note for result.  US ARTERIAL ABI (SCREENING LOWER EXTREMITY)  Result Date: 08/04/2021 CLINICAL DATA:  Diabetes, osteomyelitis of left fourth toe and status post amputation of left fourth toe. EXAM: NONINVASIVE PHYSIOLOGIC VASCULAR STUDY OF BILATERAL LOWER EXTREMITIES TECHNIQUE: Evaluation of both lower extremities were performed at rest, including calculation of ankle-brachial indices with single level Doppler, pressure and pulse volume recording. COMPARISON:  None Available. FINDINGS: Right ABI:  1.35 Left ABI:  1.07 Right Lower Extremity: Monophasic posterior tibial and triphasic dorsalis pedis waveforms. Left Lower Extremity: Biphasic posterior tibial and monophasic dorsalis pedis waveforms. 1.0-1.4 Normal IMPRESSION: Normal resting ankle-brachial indices. 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 Electronically Signed   By: Aletta Edouard M.D.   On: 08/04/2021 10:11   DG Foot Complete Left  Result Date: 08/03/2021 CLINICAL DATA:   Fourth digit amputation, postop EXAM: LEFT FOOT - COMPLETE 3+ VIEW COMPARISON:  08/01/2021 FINDINGS: Frontal, oblique, lateral views of the left foot are obtained. Interval amputation of the fourth digit at the level of the metatarsophalangeal joint. Stable osteoarthritis most pronounced within the ankle and hindfoot. Diffuse subcutaneous edema. Atherosclerosis. IMPRESSION: 1. Left fourth digit amputation. 2. Stable osteoarthritis. Electronically Signed   By: Randa Ngo M.D.   On: 08/03/2021 19:54   DG Foot Complete Left  Result Date: 08/01/2021 CLINICAL DATA:  Fourth toe ulceration, oozing, osteomyelitis EXAM: LEFT FOOT - COMPLETE 3+ VIEW COMPARISON:  01/27/2021 FINDINGS: No fracture or dislocation of the left foot. Ulceration of the tip of the left fourth digit with bony erosion of the tuft of the left fourth distal phalanx. Flatfoot deformity. Vascular calcinosis. IMPRESSION: 1. Ulceration of the tip of the left fourth digit with bony erosion of the tuft of the left fourth distal phalanx, concerning for osteomyelitis. 2. Flatfoot deformity. Electronically Signed   By: Delanna Ahmadi M.D.   On: 08/01/2021 16:42    Microbiology: Results for orders placed  or performed during the hospital encounter of 08/01/21  Gastrointestinal Panel by PCR , Stool     Status: None   Collection Time: 08/01/21  3:19 PM   Specimen: Stool  Result Value Ref Range Status   Campylobacter species NOT DETECTED NOT DETECTED Final   Plesimonas shigelloides NOT DETECTED NOT DETECTED Final   Salmonella species NOT DETECTED NOT DETECTED Final   Yersinia enterocolitica NOT DETECTED NOT DETECTED Final   Vibrio species NOT DETECTED NOT DETECTED Final   Vibrio cholerae NOT DETECTED NOT DETECTED Final   Enteroaggregative E coli (EAEC) NOT DETECTED NOT DETECTED Final   Enteropathogenic E coli (EPEC) NOT DETECTED NOT DETECTED Final   Enterotoxigenic E coli (ETEC) NOT DETECTED NOT DETECTED Final   Shiga like toxin producing E coli  (STEC) NOT DETECTED NOT DETECTED Final   Shigella/Enteroinvasive E coli (EIEC) NOT DETECTED NOT DETECTED Final   Cryptosporidium NOT DETECTED NOT DETECTED Final   Cyclospora cayetanensis NOT DETECTED NOT DETECTED Final   Entamoeba histolytica NOT DETECTED NOT DETECTED Final   Giardia lamblia NOT DETECTED NOT DETECTED Final   Adenovirus F40/41 NOT DETECTED NOT DETECTED Final   Astrovirus NOT DETECTED NOT DETECTED Final   Norovirus GI/GII NOT DETECTED NOT DETECTED Final   Rotavirus A NOT DETECTED NOT DETECTED Final   Sapovirus (I, II, IV, and V) NOT DETECTED NOT DETECTED Final    Comment: Performed at [facility], [address] [place], [place] [address]  C Difficile Quick Screen w PCR reflex     Status: None   Collection Time: 08/01/21  3:19 PM   Specimen: Stool  Result Value Ref Range Status   C Diff antigen NEGATIVE NEGATIVE Final   C Diff toxin NEGATIVE NEGATIVE Final   C Diff interpretation No C. difficile detected.  Final    Comment: Performed at [facility], [address] [place], [place] [address]  Culture, blood (single)     Status: None   Collection Time: 08/01/21  3:19 PM   Specimen: BLOOD  Result Value Ref Range Status   Specimen Description BLOOD RIGTH [facility]  Final   Special Requests   Final    BOTTLES DRAWN AEROBIC AND ANAEROBIC Blood Culture adequate volume   Culture   Final    NO GROWTH 5 DAYS Performed at [facility], [address], [place], [place] [address]    Report Status 08/07/2021 FINAL  Final  Aerobic/Anaerobic Culture w Gram Stain (surgical/deep wound)     Status: None   Collection Time: 08/03/21  6:28 PM   Specimen: PATH Other; Tissue  Result Value Ref Range Status   Specimen Description   Final    TOE LEFT 4TH TOE SWAB Performed at [facility], [address] [place], [place] [address]    Special Requests   Final    NONE Performed at [facility], [address], [place],  [address]     Gram Stain   Final    RARE WBC PRESENT,BOTH PMN AND MONONUCLEAR FEW GRAM NEGATIVE RODS FEW GRAM POSITIVE COCCI IN PAIRS    Culture   Final    ABUNDANT KLEBSIELLA PNEUMONIAE ABUNDANT ESCHERICHIA COLI MODERATE ENTEROCOCCUS FAECALIS RARE BACTEROIDES UNIFORMIS BETA LACTAMASE POSITIVE Performed at [facility], [place] [address], [place],  [address]    Report Status 08/07/2021 FINAL  Final   Organism ID, Bacteria KLEBSIELLA PNEUMONIAE  Final   Organism ID, Bacteria ESCHERICHIA COLI  Final   Organism ID, Bacteria ENTEROCOCCUS  FAECALIS  Final      Susceptibility   Escherichia coli - MIC*    AMPICILLIN <=2 SENSITIVE Sensitive     CEFAZOLIN <=4 SENSITIVE Sensitive     CEFEPIME <=0.12 SENSITIVE Sensitive     CEFTAZIDIME <=1 SENSITIVE Sensitive     CEFTRIAXONE <=0.25 SENSITIVE Sensitive     CIPROFLOXACIN <=0.25 SENSITIVE Sensitive     GENTAMICIN <=1 SENSITIVE Sensitive     IMIPENEM <=0.25 SENSITIVE Sensitive     TRIMETH/SULFA <=20 SENSITIVE Sensitive     AMPICILLIN/SULBACTAM <=2 SENSITIVE Sensitive     PIP/TAZO <=4 SENSITIVE Sensitive     * ABUNDANT ESCHERICHIA COLI   Enterococcus faecalis - MIC*    AMPICILLIN <=2 SENSITIVE Sensitive     VANCOMYCIN <=0.5 SENSITIVE Sensitive     GENTAMICIN SYNERGY RESISTANT Resistant     * MODERATE ENTEROCOCCUS FAECALIS   Klebsiella pneumoniae - MIC*    AMPICILLIN RESISTANT Resistant     CEFAZOLIN <=4 SENSITIVE Sensitive     CEFEPIME <=0.12 SENSITIVE Sensitive     CEFTAZIDIME <=1 SENSITIVE Sensitive     CEFTRIAXONE <=0.25 SENSITIVE Sensitive     CIPROFLOXACIN <=0.25 SENSITIVE Sensitive     GENTAMICIN <=1 SENSITIVE Sensitive     IMIPENEM <=0.25 SENSITIVE Sensitive     TRIMETH/SULFA <=20 SENSITIVE Sensitive     AMPICILLIN/SULBACTAM <=2 SENSITIVE Sensitive     PIP/TAZO <=4 SENSITIVE Sensitive     * ABUNDANT KLEBSIELLA PNEUMONIAE    Labs: CBC: Recent Labs  Lab 08/03/21 0409  WBC 8.6  HGB 12.2*  HCT 35.7*  MCV 90.2  PLT 920    Basic Metabolic Panel: Recent Labs  Lab 08/03/21 0409 08/04/21 0356 08/05/21 0411 08/06/21 0401 08/07/21 1224 08/08/21 0438  NA 132* 132* 133* 132* 132*  --   K 4.3 4.8 4.7 4.8 4.0  --   CL 97* 97* 101 97* 94*  --   CO2 '28 30 28 31 28  ' --   GLUCOSE 245* 295* 331* 381* 274*  --   BUN '13 16 18 17 18  ' --   CREATININE 0.63 0.74 0.56* 0.57* 0.65 0.59*  CALCIUM 9.1 9.1 9.1 9.3 9.6  --    Liver Function Tests: Recent Labs  Lab 08/03/21 0409  AST 35  ALT 23  ALKPHOS 133*  BILITOT 0.8  PROT 6.9  ALBUMIN 3.1*   CBG: Recent Labs  Lab 08/08/21 1146 08/08/21 1708 08/08/21 2058 08/09/21 0749 08/09/21 1108  GLUCAP 331* 443* 225* 136* 252*    Discharge time spent: greater than 30 minutes.  Signed: Lorella Nimrod, MD [facility] 08/09/2021

## 2021-08-09 NOTE — Progress Notes (Signed)
Physical Therapy Treatment Patient Details Name: Cody Cowan MRN: [id number] DOB: Mar 03, 1966 Today's Date: 08/09/2021   History of Present Illness Pt is a 56 yo male s/p left 4th toe ambutation with debridement of L foot ulcer. PMH of etoh, DM.    PT Comments    Pt up in bathroom finishing up getting cleaned up, without RW. Pt stated he was using the IV pole, but pt educated on importance of PWB and inability to maintain without RW, verbalized understanding. He was able to ambulate ~172ft with RW, modI, occasional cues for [facility]  but no LOB noted. He was able to verbalized proper stair technique as well. Pt still required supervision for transfers for reminders about safe hand placement. Overall the patient continues to make great progress in mobility, recommendation remains appropriate.    Recommendations for follow up therapy are one component of a multi-disciplinary discharge planning process, led by the attending physician.  Recommendations may be updated based on patient status, additional functional criteria and insurance authorization.  Follow Up Recommendations  Outpatient PT     Assistance Recommended at Discharge PRN  Patient can return home with the following Assistance with cooking/housework;Assist for transportation;Help with stairs or ramp for entrance   Equipment Recommendations  Rolling walker (2 wheels)    Recommendations for Other Services       Precautions / Restrictions Precautions Precautions: Fall Restrictions Weight Bearing Restrictions: Yes LLE Weight Bearing: Partial weight bearing LLE Partial Weight Bearing Percentage or Pounds: 50%     Mobility  Bed Mobility               General bed mobility comments: pt up in bathroom at start of session    Transfers Overall transfer level: Needs assistance Equipment used: Rolling walker (2 wheels) Transfers: Sit to/from Stand Sit to Stand: Supervision           General transfer  comment: cued for hand placement and weight bearing status    Ambulation/Gait Ambulation/Gait assistance: Modified independent (Device/Increase time) Gait Distance (Feet): 180 Feet Assistive device: Rolling walker (2 wheels)         General Gait Details: cued for PWB intermittently   Stairs             Wheelchair Mobility    Modified Rankin (Stroke Patients Only)       Balance Overall balance assessment: Needs assistance Sitting-balance support: Feet supported Sitting balance-Leahy Scale: Good       Standing balance-Leahy Scale: Good                              Cognition Arousal/Alertness: Awake/alert Behavior During Therapy: WFL for tasks assessed/performed Overall Cognitive Status: Within Functional Limits for tasks assessed                                          Exercises      General Comments        Pertinent Vitals/Pain Pain Assessment Pain Assessment: No/denies pain    Home Living                          Prior Function            PT Goals (current goals can now be found in the care plan section) Progress towards PT goals: Progressing  toward goals    Frequency    Min 2X/week      PT Plan Current plan remains appropriate    Co-evaluation              AM-PAC PT "6 Clicks" Mobility   Outcome Measure  Help needed turning from your back to your side while in a flat bed without using bedrails?: None Help needed moving from lying on your back to sitting on the side of a flat bed without using bedrails?: None Help needed moving to and from a bed to a chair (including a wheelchair)?: None Help needed standing up from a chair using your arms (e.g., wheelchair or bedside chair)?: None Help needed to walk in hospital room?: None Help needed climbing 3-5 steps with a railing? : A Little 6 Click Score: 23    End of Session   Activity Tolerance: Patient tolerated treatment well Patient  left: in chair;with call bell/phone within reach Nurse Communication: Mobility status PT Visit Diagnosis: Other abnormalities of gait and mobility (R26.89);Difficulty in walking, not elsewhere classified (R26.2);Muscle weakness (generalized) (M62.81);Pain Pain - Right/Left: Left Pain - part of body: Ankle and joints of foot     Time: 4656-8127 PT Time Calculation (min) (ACUTE ONLY): 13 min  Charges:  $Therapeutic Activity: 8-22 mins                    Olga Coaster PT, DPT 12:50 PM,08/09/21

## 2021-08-09 NOTE — Progress Notes (Signed)
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

## 2021-08-09 NOTE — Plan of Care (Signed)

## 2021-08-09 NOTE — TOC Progression Note (Signed)
Transition of Care [facility]) - Progression Note    Patient Details  Name: Cody Cowan MRN: [id number] Date of Birth: 02/08/1966  Transition of [facility]) CM/SW Contact  Marlowe Sax, RN Phone Number: 08/09/2021, 2:56 PM  Clinical Narrative:     Meds delivered to the patient from [facility]  Expected Discharge Plan: OP Rehab Barriers to Discharge: Barriers Resolved  Expected Discharge Plan and Services Expected Discharge Plan: OP Rehab   Discharge Planning Services: CM Consult   Living arrangements for the past 2 months: Single Family Home Expected Discharge Date: 08/09/21               DME Arranged: Dan Humphreys rolling DME Agency: [employer] Date DME Agency Contacted: 08/04/21 Time DME Agency Contacted: 1000 Representative spoke with at DME Agency: rhonda HH Arranged: NA HH Agency: NA         Social Determinants of Health (SDOH) Interventions    Readmission Risk Interventions     View : No data to display.

## 2021-08-11 ENCOUNTER — Other Ambulatory Visit: Payer: Self-pay | Admitting: Pharmacy Technician

## 2021-08-11 NOTE — Patient Outreach (Signed)
Patient only signed DOH Attestation.  Would need to provide current year's household income if PAP medications were needed. ? ?Madysin Crisp J. Correne Lalani ?Patient Advocate Specialist ?[facility]  ?

## 2021-08-23 ENCOUNTER — Encounter: Payer: Self-pay | Admitting: Podiatry

## 2021-08-24 ENCOUNTER — Encounter: Payer: Self-pay | Admitting: Podiatry

## 2022-01-01 ENCOUNTER — Inpatient Hospital Stay: Payer: Self-pay

## 2022-01-01 ENCOUNTER — Inpatient Hospital Stay
Admission: EM | Admit: 2022-01-01 | Discharge: 2022-01-06 | DRG: 240 | Disposition: A | Discharge status: Home Health | Payer: Self-pay | Attending: Osteopathic Medicine | Admitting: Osteopathic Medicine

## 2022-01-01 ENCOUNTER — Emergency Department: Payer: Self-pay

## 2022-01-01 DIAGNOSIS — F1020 Alcohol dependence, uncomplicated: Secondary | ICD-10-CM | POA: Diagnosis present

## 2022-01-01 DIAGNOSIS — Z79899 Other long term (current) drug therapy: Secondary | ICD-10-CM

## 2022-01-01 DIAGNOSIS — E872 Acidosis, unspecified: Secondary | ICD-10-CM | POA: Diagnosis present

## 2022-01-01 DIAGNOSIS — M6250 Muscle wasting and atrophy, not elsewhere classified, unspecified site: Secondary | ICD-10-CM | POA: Diagnosis present

## 2022-01-01 DIAGNOSIS — M86172 Other acute osteomyelitis, left ankle and foot: Secondary | ICD-10-CM | POA: Diagnosis present

## 2022-01-01 DIAGNOSIS — M8610 Other acute osteomyelitis, unspecified site: Secondary | ICD-10-CM | POA: Diagnosis present

## 2022-01-01 DIAGNOSIS — E871 Hypo-osmolality and hyponatremia: Secondary | ICD-10-CM | POA: Diagnosis present

## 2022-01-01 DIAGNOSIS — N179 Acute kidney failure, unspecified: Secondary | ICD-10-CM

## 2022-01-01 DIAGNOSIS — E1169 Type 2 diabetes mellitus with other specified complication: Secondary | ICD-10-CM | POA: Diagnosis present

## 2022-01-01 DIAGNOSIS — Z7984 Long term (current) use of oral hypoglycemic drugs: Secondary | ICD-10-CM

## 2022-01-01 DIAGNOSIS — E1165 Type 2 diabetes mellitus with hyperglycemia: Secondary | ICD-10-CM | POA: Diagnosis present

## 2022-01-01 DIAGNOSIS — I10 Essential (primary) hypertension: Secondary | ICD-10-CM | POA: Diagnosis present

## 2022-01-01 DIAGNOSIS — Z89422 Acquired absence of other left toe(s): Secondary | ICD-10-CM

## 2022-01-01 DIAGNOSIS — R739 Hyperglycemia, unspecified: Secondary | ICD-10-CM

## 2022-01-01 DIAGNOSIS — E1152 Type 2 diabetes mellitus with diabetic peripheral angiopathy with gangrene: Principal | ICD-10-CM | POA: Diagnosis present

## 2022-01-01 DIAGNOSIS — Z603 Acculturation difficulty: Secondary | ICD-10-CM | POA: Diagnosis present

## 2022-01-01 DIAGNOSIS — L03116 Cellulitis of left lower limb: Secondary | ICD-10-CM | POA: Diagnosis present

## 2022-01-01 DIAGNOSIS — Z794 Long term (current) use of insulin: Secondary | ICD-10-CM

## 2022-01-01 DIAGNOSIS — E785 Hyperlipidemia, unspecified: Secondary | ICD-10-CM | POA: Diagnosis present

## 2022-01-01 DIAGNOSIS — M869 Osteomyelitis, unspecified: Secondary | ICD-10-CM

## 2022-01-01 LAB — CBC WITH DIFFERENTIAL/PLATELET
Abs Immature Granulocytes: 0.1 10*3/uL — ABNORMAL HIGH (ref 0.00–0.07)
Basophils Absolute: 0 10*3/uL (ref 0.0–0.1)
Basophils Relative: 1 %
Eosinophils Absolute: 0 10*3/uL (ref 0.0–0.5)
Eosinophils Relative: 0 %
HCT: 36.5 % — ABNORMAL LOW (ref 39.0–52.0)
Hemoglobin: 12.6 g/dL — ABNORMAL LOW (ref 13.0–17.0)
Immature Granulocytes: 1 %
Lymphocytes Relative: 20 %
Lymphs Abs: 1.7 10*3/uL (ref 0.7–4.0)
MCH: 30.8 pg (ref 26.0–34.0)
MCHC: 34.5 g/dL (ref 30.0–36.0)
MCV: 89.2 fL (ref 80.0–100.0)
Monocytes Absolute: 0.6 10*3/uL (ref 0.1–1.0)
Monocytes Relative: 7 %
Neutro Abs: 5.8 10*3/uL (ref 1.7–7.7)
Neutrophils Relative %: 71 %
Platelets: 388 10*3/uL (ref 150–400)
RBC: 4.09 MIL/uL — ABNORMAL LOW (ref 4.22–5.81)
RDW: 11.8 % (ref 11.5–15.5)
WBC: 8.2 10*3/uL (ref 4.0–10.5)
nRBC: 0 % (ref 0.0–0.2)

## 2022-01-01 LAB — COMPREHENSIVE METABOLIC PANEL
ALT: 23 U/L (ref 0–44)
AST: 29 U/L (ref 15–41)
Albumin: 3.9 g/dL (ref 3.5–5.0)
Alkaline Phosphatase: 187 U/L — ABNORMAL HIGH (ref 38–126)
Anion gap: 16 — ABNORMAL HIGH (ref 5–15)
BUN: 16 mg/dL (ref 6–20)
CO2: 26 mmol/L (ref 22–32)
Calcium: 9.9 mg/dL (ref 8.9–10.3)
Chloride: 88 mmol/L — ABNORMAL LOW (ref 98–111)
Creatinine, Ser: 1.01 mg/dL (ref 0.61–1.24)
GFR, Estimated: >60 mL/min (ref >60)
Glucose, Bld: 549 mg/dL (ref 70–99)
Potassium: 4.5 mmol/L (ref 3.5–5.1)
Sodium: 130 mmol/L — ABNORMAL LOW (ref 135–145)
Total Bilirubin: 1.1 mg/dL (ref 0.3–1.2)
Total Protein: 9.3 g/dL — ABNORMAL HIGH (ref 6.5–8.1)

## 2022-01-01 LAB — URINALYSIS, ROUTINE W REFLEX MICROSCOPIC
Bilirubin Urine: NEGATIVE
Glucose, UA: >=500 mg/dL — AB
Ketones, ur: 20 mg/dL — AB
Nitrite: NEGATIVE
Protein, ur: NEGATIVE mg/dL
RBC / HPF: >50 RBC/hpf — ABNORMAL HIGH (ref 0–5)
Specific Gravity, Urine: 1.014 (ref 1.005–1.030)
pH: 6 (ref 5.0–8.0)

## 2022-01-01 LAB — LACTIC ACID, PLASMA
Lactic Acid, Venous: 2.5 mmol/L (ref 0.5–1.9)
Lactic Acid, Venous: 1 mmol/L (ref 0.5–1.9)

## 2022-01-01 LAB — PROTIME-INR
INR: 1.1 (ref 0.8–1.2)
Prothrombin Time: 14.4 seconds (ref 11.4–15.2)

## 2022-01-01 LAB — CBG MONITORING, ED: Glucose-Capillary: 358 mg/dL — ABNORMAL HIGH (ref 70–99)

## 2022-01-01 LAB — GLUCOSE, CAPILLARY: Glucose-Capillary: 239 mg/dL — ABNORMAL HIGH (ref 70–99)

## 2022-01-01 LAB — BETA-HYDROXYBUTYRIC ACID: Beta-Hydroxybutyric Acid: 0.67 mmol/L — ABNORMAL HIGH (ref 0.05–0.27)

## 2022-01-01 MED ORDER — ADULT MULTIVITAMIN W/MINERALS CH
1.0000 | ORAL_TABLET | Freq: Every day | ORAL | Status: DC
  Administered 2022-01-01 – 2022-01-06 (×5): 1 via ORAL
  Filled 2022-01-01 (×5): qty 1

## 2022-01-01 MED ORDER — SODIUM CHLORIDE 0.9 % IV BOLUS
1500.0000 mL | Freq: Once | INTRAVENOUS | Status: AC
  Administered 2022-01-01: 1500 mL via INTRAVENOUS

## 2022-01-01 MED ORDER — INSULIN GLARGINE-YFGN 100 UNIT/ML ~~LOC~~ SOLN
20.0000 [IU] | Freq: Every day | SUBCUTANEOUS | Status: DC
  Administered 2022-01-01: 20 [IU] via SUBCUTANEOUS
  Filled 2022-01-01: qty 0.2

## 2022-01-01 MED ORDER — ACETAMINOPHEN 325 MG PO TABS
650.0000 mg | ORAL_TABLET | Freq: Four times a day (QID) | ORAL | Status: DC | PRN
  Administered 2022-01-04: 650 mg via ORAL
  Filled 2022-01-01: qty 2

## 2022-01-01 MED ORDER — VANCOMYCIN HCL 1250 MG/250ML IV SOLN
1250.0000 mg | Freq: Once | INTRAVENOUS | Status: AC
  Administered 2022-01-01: 1250 mg via INTRAVENOUS
  Filled 2022-01-01: qty 250

## 2022-01-01 MED ORDER — SODIUM CHLORIDE 0.9 % IV SOLN
2.0000 g | Freq: Three times a day (TID) | INTRAVENOUS | Status: DC
  Administered 2022-01-01 – 2022-01-04 (×9): 2 g via INTRAVENOUS
  Filled 2022-01-01: qty 2
  Filled 2022-01-01: qty 12.5
  Filled 2022-01-01: qty 2
  Filled 2022-01-01: qty 12.5
  Filled 2022-01-01 (×4): qty 2
  Filled 2022-01-01 (×2): qty 12.5

## 2022-01-01 MED ORDER — INSULIN ASPART 100 UNIT/ML IJ SOLN: 0.0000 [IU] | Freq: Three times a day (TID) | INTRAMUSCULAR | Status: DC

## 2022-01-01 MED ORDER — POLYETHYLENE GLYCOL 3350 17 G PO PACK: 17.0000 g | PACK | Freq: Every day | ORAL | Status: DC | PRN

## 2022-01-01 MED ORDER — ONDANSETRON HCL 4 MG/2ML IJ SOLN: 4.0000 mg | Freq: Four times a day (QID) | INTRAMUSCULAR | Status: DC | PRN

## 2022-01-01 MED ORDER — ACETAMINOPHEN 650 MG RE SUPP: 650.0000 mg | Freq: Four times a day (QID) | RECTAL | Status: DC | PRN

## 2022-01-01 MED ORDER — METRONIDAZOLE 500 MG PO TABS
500.0000 mg | ORAL_TABLET | Freq: Two times a day (BID) | ORAL | Status: DC
  Administered 2022-01-01: 500 mg via ORAL
  Filled 2022-01-01: qty 1

## 2022-01-01 MED ORDER — INSULIN ASPART 100 UNIT/ML IJ SOLN
0.0000 [IU] | Freq: Three times a day (TID) | INTRAMUSCULAR | Status: DC
  Administered 2022-01-01: 20 [IU] via SUBCUTANEOUS
  Filled 2022-01-01: qty 1

## 2022-01-01 MED ORDER — FOLIC ACID 1 MG PO TABS
1.0000 mg | ORAL_TABLET | Freq: Every day | ORAL | Status: DC
  Administered 2022-01-01 – 2022-01-06 (×5): 1 mg via ORAL
  Filled 2022-01-01 (×5): qty 1

## 2022-01-01 MED ORDER — GADOBUTROL 1 MMOL/ML IV SOLN
5.0000 mL | Freq: Once | INTRAVENOUS | Status: AC | PRN
  Administered 2022-01-01: 5 mL via INTRAVENOUS

## 2022-01-01 MED ORDER — HYDROMORPHONE HCL 1 MG/ML IJ SOLN
1.0000 mg | INTRAMUSCULAR | Status: DC | PRN
  Administered 2022-01-01: 1 mg via INTRAVENOUS
  Filled 2022-01-01: qty 1

## 2022-01-01 MED ORDER — VANCOMYCIN HCL 500 MG/100ML IV SOLN
500.0000 mg | Freq: Two times a day (BID) | INTRAVENOUS | Status: DC
  Administered 2022-01-02: 500 mg via INTRAVENOUS
  Filled 2022-01-01: qty 100

## 2022-01-01 MED ORDER — ONDANSETRON HCL 4 MG PO TABS: 4.0000 mg | ORAL_TABLET | Freq: Four times a day (QID) | ORAL | Status: DC | PRN

## 2022-01-01 MED ORDER — ATORVASTATIN CALCIUM 20 MG PO TABS
40.0000 mg | ORAL_TABLET | Freq: Every day | ORAL | Status: DC
  Administered 2022-01-01 – 2022-01-06 (×5): 40 mg via ORAL
  Filled 2022-01-01 (×5): qty 2

## 2022-01-01 MED ORDER — LABETALOL HCL 5 MG/ML IV SOLN
10.0000 mg | INTRAVENOUS | Status: DC | PRN
  Administered 2022-01-01: 10 mg via INTRAVENOUS
  Filled 2022-01-01: qty 4

## 2022-01-01 MED ORDER — SODIUM CHLORIDE 0.9% FLUSH
3.0000 mL | Freq: Two times a day (BID) | INTRAVENOUS | Status: DC
  Administered 2022-01-01 – 2022-01-06 (×8): 3 mL via INTRAVENOUS

## 2022-01-01 MED ORDER — THIAMINE MONONITRATE 100 MG PO TABS
100.0000 mg | ORAL_TABLET | Freq: Every day | ORAL | Status: DC
  Administered 2022-01-01 – 2022-01-06 (×5): 100 mg via ORAL
  Filled 2022-01-01 (×5): qty 1

## 2022-01-01 MED ORDER — FENTANYL CITRATE PF 50 MCG/ML IJ SOSY
50.0000 ug | PREFILLED_SYRINGE | Freq: Once | INTRAMUSCULAR | Status: AC
  Administered 2022-01-01: 50 ug via INTRAVENOUS
  Filled 2022-01-01: qty 1

## 2022-01-01 MED ORDER — INSULIN GLARGINE-YFGN 100 UNIT/ML ~~LOC~~ SOLN
15.0000 [IU] | Freq: Every day | SUBCUTANEOUS | Status: DC
  Filled 2022-01-01: qty 0.15

## 2022-01-01 MED ORDER — METRONIDAZOLE 500 MG/100ML IV SOLN
500.0000 mg | Freq: Two times a day (BID) | INTRAVENOUS | Status: DC
  Administered 2022-01-01 – 2022-01-04 (×6): 500 mg via INTRAVENOUS
  Filled 2022-01-01 (×6): qty 100

## 2022-01-01 MED ORDER — LACTATED RINGERS IV SOLN: INTRAVENOUS | Status: AC

## 2022-01-01 NOTE — Assessment & Plan Note (Addendum)
Patient presenting with markedly elevated glucose at 549 with elevated anion gap of 16.  AG may be explained by lactic acidosis and there is no evidence of acidosis as bicarb is 26.  Differential still includes HHS.  He received 1.5 L of fluids with improvement of glucose to 358.  He has not yet received any insulin products in the ED.  Given patient is asymptomatic at this time, we will start with SSI (3 times daily), while still obtaining urinalysis, osmolality, and beta hydroxy acid.  - SSI, resistant - A1c - Serum osmolality - Beta hydroxy acid - Urinalysis

## 2022-01-01 NOTE — Assessment & Plan Note (Signed)
In the setting of acute infection and severe hyperglycemia.  - IV fluid resuscitation - Repeat BMP in the a.m.

## 2022-01-01 NOTE — Assessment & Plan Note (Signed)
Uncontrolled due to lack of resources  - SSI, resistant - A1c pending - TOC consult

## 2022-01-01 NOTE — ED Triage Notes (Signed)
Pt to ED via POV from home. Pt reports foot infection x1 week. Pt hx DM. Pt reports CP with cough.

## 2022-01-01 NOTE — H&P (Signed)
History and Physical    Patient: Cody Cowan [id number] DOB: 08-27-1965 DOA: 01/01/2022 DOS: the patient was seen and examined on 01/01/2022 PCP: Patient, No Pcp Per  Patient coming from: Home  Chief Complaint:  Chief Complaint  Patient presents with   Wound Infection   HPI: Cody Cowan is a 56 y.o. male with medical history significant of type 2 diabetes, hyperlipidemia, prior osteomyelitis, who presents to the ED with left foot wound.  Mr. Cody Cowan states that he first noticed blistering occurring on his left foot on both the dorsal and plantar aspect approximately 8 days ago.  These blisters became painful and due to that, he was unable to go to work.  Then approximately 4 days ago, he noticed that the blisters were becoming red and eventually starting to turn black in some places.  He noticed that the redness started to moved to the top of his foot and his foot became quite painful.  He denies any fever but has noticed chills.  He endorses generalized abdominal pain and mild nausea but denies any vomiting, diarrhea, chest pain, shortness of breath, palpitations.  He notes that he has been unable to take any of his diabetic medicines as he does not have transportation to his appointments or to the pharmacy.  His sister is his only manner of transportation, and she works from 6 AM to 5 PM every day.  ED course: Arrival to the ED, patient was hypertensive at 143/99, with heart rate of 123.  He was afebrile at 98.2.  He was saturating at 98% on room air.  Initial work-up notable for glucose of 549, lactic acidosis of 2.5.  Left foot x-ray obtained that demonstrates acute osteomyelitis.  TRH contacted for admission.   Review of Systems: As mentioned in the history of present illness. All other systems reviewed and are negative. Past Medical History:  Diagnosis Date   Diabetes [facility])    Past Surgical History:  Procedure Laterality Date   AMPUTATION TOE  Left 08/03/2021   Procedure: AMPUTATION TOE 4th digit left toe;  Surgeon: Edrick Kins, DPM;  Location: [facility] ORS;  Service: Podiatry;  Laterality: Left;   INCISION AND DRAINAGE Left 08/03/2021   Procedure: INCISION AND DRAINAGE LEFT FOOT;  Surgeon: Edrick Kins, DPM;  Location: [facility] ORS;  Service: Podiatry;  Laterality: Left;   LOWER EXTREMITY ANGIOGRAPHY Left 08/06/2021   Procedure: Lower Extremity Angiography;  Surgeon: Algernon Huxley, MD;  Location: [place] [facility];  Service: Cardiovascular;  Laterality: Left;   Social History:  reports that he has never smoked. He has never used smokeless tobacco. He reports that he does not currently use alcohol. No history on file for drug use.  No Known Allergies  History reviewed. No pertinent family history.  Prior to Admission medications   Medication Sig Start Date End Date Taking? Authorizing Provider  atorvastatin (LIPITOR) 40 MG tablet Take 1 tablet (40 mg total) by mouth daily. 03/02/21 05/31/21  Enzo Bi, MD  fenofibrate (TRICOR) 145 MG tablet Take 1 tablet (145 mg total) by mouth daily. 03/02/21 05/31/21  Enzo Bi, MD  Insulin Glargine [facility]) 100 UNIT/ML Inject 30 Units into the skin 2 (two) times daily. 08/09/21   Lorella Nimrod, MD  Insulin Pen Needle (UNIFINE PENTIPS) 32G X 4 MM MISC use with insulin pens 08/09/21   Lorella Nimrod, MD  metFORMIN (GLUCOPHAGE-XR) 500 MG 24 hr tablet Take 1 tablet (500 mg total) by mouth 2 (two) times daily with  a meal. 08/09/21 02/05/22  Lorella Nimrod, MD  insulin isophane & regular human KwikPen (NOVOLIN 70/30 KWIKPEN) (70-30) 100 UNIT/ML KwikPen Inject 30 Units into the skin 2 (two) times daily before lunch and supper. 02/01/21 02/01/21  Antonieta Pert, MD    Physical Exam: Vitals:   01/01/22 1355 01/01/22 1356 01/01/22 1645 01/01/22 1700  BP: (!) 193/110  (!) 159/105 (!) 187/127  Pulse: (!) 116  (!) 112 (!) 119  Resp: 18   18  Temp: 98.2 F (36.8 C)   98.9 F (37.2 C)  TempSrc: Oral   Oral   SpO2: 97%  96% 97%  Weight:  53.5 kg    Height:  5\' 10"  (1.778 m)     Physical Exam Vitals and nursing note reviewed.  Constitutional:      General: He is not in acute distress.    Appearance: He is normal weight.  HENT:     Head: Normocephalic and atraumatic.     Mouth/Throat:     Mouth: Mucous membranes are moist.     Pharynx: Oropharynx is clear.  Eyes:     Conjunctiva/sclera: Conjunctivae normal.     Pupils: Pupils are equal, round, and reactive to light.  Cardiovascular:     Rate and Rhythm: Regular rhythm. Tachycardia present.     Heart sounds: No murmur heard.    No gallop.  Pulmonary:     Effort: Pulmonary effort is normal. No respiratory distress.     Breath sounds: Normal breath sounds. No wheezing or rales.  Abdominal:     General: Bowel sounds are normal. There is no distension.     Palpations: Abdomen is soft.     Tenderness: There is no abdominal tenderness. There is no guarding.  Musculoskeletal:     Right lower leg: No edema.     Left lower leg: No edema.  Skin:    General: Skin is warm and dry.     Findings: Erythema and wound present.     Comments: Left foot dorsal aspect with significant erythema that is tense to the midfoot in addition to multiple areas of ulceration on the first, second and third digits.  On the left foot plantar aspect, there is a large area of necrosis involving the first and third digits.   Please see pictures below for details  Neurological:     General: No focal deficit present.     Mental Status: He is alert and oriented to person, place, and time. Mental status is at baseline.     Motor: No weakness.  Psychiatric:        Mood and Affect: Mood normal.        Behavior: Behavior normal.        Thought Content: Thought content normal.        Judgment: Judgment normal.         Data Reviewed: CMP notable for hemoglobin of 12.6.  No leukocytosis or thrombocytopenia.  CMP notable for sodium of 130, chloride of 88, glucose of  549, bicarb of 26, total protein of 9.3, alkaline phosphatase of 187, anion gap of 16.  Initial lactic acid elevated at 2.5 with improvement to 1.0.  PT/INR within normal limits.  Chest x-ray personally reviewed.  No evidence of opacities or pleural effusions.  DG Foot Complete Left  Result Date: 01/01/2022 CLINICAL DATA:  Foot infection for 1 week.  History of diabetes. EXAM: LEFT FOOT - COMPLETE 3+ VIEW COMPARISON:  08/03/2021. FINDINGS: Soft tissue ulceration and air  lies lateral to the fifth metatarsophalangeal joint. There is resorption along the lateral base of the fifth toe proximal phalanx and the adjacent lateral fifth metatarsal head consistent with osteomyelitis. There is additional soft tissue air along the proximal great toe, most evident laterally just proximal to the IP joint. There is more subtle cortical resorption along the lateral distal margin of the proximal phalanx, also consistent with osteomyelitis. No other convincing osteomyelitis.  No fracture. Previous amputation of the fourth toe. Joints are normally aligned. Small plantar and dorsal calcaneal spurs. IMPRESSION: 1. Osteomyelitis of the lateral base of the fifth toe proximal phalanx, lateral aspect of the fifth metatarsal head and more subtly along the lateral margin of the great toe proximal phalanx distally near the IP joint. Electronically Signed   By: Lajean Manes M.D.   On: 01/01/2022 13:54   DG Chest 2 View  Result Date: 01/01/2022 CLINICAL DATA:  Foot infection for a week.  Cough and chest pain. EXAM: CHEST - 2 VIEW COMPARISON:  01/14/2020. FINDINGS: Normal heart, mediastinum and hila. Clear lungs.  No pleural effusion or pneumothorax. Skeletal structures are intact. IMPRESSION: No active cardiopulmonary disease. Electronically Signed   By: Lajean Manes M.D.   On: 01/01/2022 13:01    Results are pending, will review when available.  Assessment and Plan: * Acute osteomyelitis [facility]) Patient presenting with 8 days  of left foot blistering with 4 days of erythema, necrosis and pain.  Left foot x-ray confirming osteomyelitis of the fifth toe proximal phalanx and lateral aspect of the fifth metatarsal head.  This is in the setting of uncontrolled diabetes.  Osteomyelitis in the same foot in May 2023 grew Klebsiella pneumoniae, E. coli, Enterococcus faecalis, and Bacteroides uniformis.  - Podiatry consulted; appreciate their recommendations - MRI left foot with and without contrast pending - Continue broad-spectrum antibiotics with Vanco, cefepime, metronidazole - Blood cultures pending - Will need surgical cultures to narrow antibiotics  Hyperglycemic crisis in diabetes mellitus ([place]) Patient presenting with markedly elevated glucose at 549 with elevated anion gap of 16.  AG may be explained by lactic acidosis and there is no evidence of acidosis as bicarb is 26.  Differential still includes HHS.  He received 1.5 L of fluids with improvement of glucose to 358.  He has not yet received any insulin products in the ED.  Given patient is asymptomatic at this time, we will start with SSI (3 times daily), while still obtaining urinalysis, osmolality, and beta hydroxy acid.  - SSI, resistant - A1c - Serum osmolality - Beta hydroxy acid - Urinalysis  Uncontrolled type 2 diabetes mellitus with hyperglycemia, without long-term current use of insulin (HCC) Uncontrolled due to lack of resources  - SSI, resistant - A1c pending - TOC consult  AKI (acute kidney injury) ([place]) In the setting of acute infection and severe hyperglycemia.  - IV fluid resuscitation - Repeat BMP in the a.m.  Hypertension Patient presenting with hypertension with most recent blood pressure continuing to rise up to 187/127.  He is asymptomatic at this time.  I suspect this is secondary to acute illness versus chronic untreated essential hypertension.  Patient is not on any antihypertensives at home.  - Labetalol as needed every 2 hours  for SBP > 180 or DBP > 110 - If remains elevated overnight, consider starting oral antihypertensives tomorrow  EtOH dependence [facility]) Patient states he is not actively drinking daily at this time, however previous admissions due to alcohol related complications.  -CIWA without Ativan  Advance Care Planning:   Code Status: Full Code   Consults: Podiatry  Family Communication: No family at bedside  Severity of Illness: The appropriate patient status for this patient is INPATIENT. Inpatient status is judged to be reasonable and necessary in order to provide the required intensity of service to ensure the patient's safety. The patient's presenting symptoms, physical exam findings, and initial radiographic and laboratory data in the context of their chronic comorbidities is felt to place them at high risk for further clinical deterioration. Furthermore, it is not anticipated that the patient will be medically stable for discharge from the hospital within 2 midnights of admission.   * I certify that at the point of admission it is my clinical judgment that the patient will require inpatient hospital care spanning beyond 2 midnights from the point of admission due to high intensity of service, high risk for further deterioration and high frequency of surveillance required.*  Author: Jose Persia, MD 01/01/2022 6:16 PM  For on call review [email or web address].

## 2022-01-01 NOTE — ED Notes (Signed)
Pt up to restroom without assistance 

## 2022-01-01 NOTE — Assessment & Plan Note (Signed)
Patient presenting with hypertension with most recent blood pressure continuing to rise up to 187/127.  He is asymptomatic at this time.  I suspect this is secondary to acute illness versus chronic untreated essential hypertension.  Patient is not on any antihypertensives at home.  - Labetalol as needed every 2 hours for SBP > 180 or DBP > 110 - If remains elevated overnight, consider starting oral antihypertensives tomorrow

## 2022-01-01 NOTE — Assessment & Plan Note (Signed)
Patient presenting with 8 days of left foot blistering with 4 days of erythema, necrosis and pain.  Left foot x-ray confirming osteomyelitis of the fifth toe proximal phalanx and lateral aspect of the fifth metatarsal head.  This is in the setting of uncontrolled diabetes.  Osteomyelitis in the same foot in May 2023 grew Klebsiella pneumoniae, E. coli, Enterococcus faecalis, and Bacteroides uniformis.  - Podiatry consulted; appreciate their recommendations - MRI left foot with and without contrast pending - Continue broad-spectrum antibiotics with Vanco, cefepime, metronidazole - Blood cultures pending - Will need surgical cultures to narrow antibiotics

## 2022-01-01 NOTE — ED Notes (Signed)
Dr. Charleen Kirks paged about pts continually elevated BP. MD states this is most likely chronic for this pt.

## 2022-01-01 NOTE — Assessment & Plan Note (Signed)
Patient states he is not actively drinking daily at this time, however previous admissions due to alcohol related complications.  -CIWA without Ativan

## 2022-01-01 NOTE — Consult Note (Signed)
Pharmacy Antibiotic Note  Cody Cowan is a 56 y.o. male admitted on 01/01/2022 with  a suspected diabetic foot infection .  Pharmacy has been consulted for vancomycin and cefepime dosing.  Scr 1.01 (baseline 0.5-0.7s)  Plan: Vancomycin 1250mg  IV LD, then vancomycin 500mg  IV Q12H --Calculated AUC 461, Cmin 13.8 --Scr daily per protocol --Levels at steady state or as clinically indicated  Cefepime 2gm IV Q8H  Height: 5\' 10"  (177.8 cm) Weight: 53.5 kg (118 lb) IBW/kg (Calculated) : 73  Temp (24hrs), Avg:97.9 F (36.6 C), Min:97.5 F (36.4 C), Max:98.2 F (36.8 C)  Recent Labs  Lab 01/01/22 1225  WBC 8.2  CREATININE 1.01  LATICACIDVEN 2.5*    Estimated Creatinine Clearance: 62.5 mL/min (by C-G formula based on SCr of 1.01 mg/dL).    No Known Allergies  Antimicrobials this admission: Vancomycin 10/28 >>  Cefepime 10/28 >>  Metronidazole 10/28 >>  Dose adjustments this admission: N/A  Microbiology results: 10/28 BCx: pending   Thank you for allowing pharmacy to be a part of this patient's care.  Alison Murray 01/01/2022 2:15 PM

## 2022-01-01 NOTE — Assessment & Plan Note (Deleted)
Patient presenting with markedly elevated glucose at 549 with elevated anion gap of 16.

## 2022-01-01 NOTE — ED Provider Notes (Signed)
This is persisting septic  [facility] Provider Note    Event Date/Time   First MD Initiated Contact with Patient 01/01/22 1234     (approximate)   History   Wound Infection  Formal translating service utilized  HPI  Cody Cowan is a 56 y.o. male with past medical history significant for PAD, diabetes, who presents to the emergency department with a foot wound.  Patient Cody Cowan is a week and a half of progressively worsening wound to his left foot.  Complains of pain is 8/10.  Endorses prior infection of his foot in the past.  States that his glucose has been elevated.  No recent antibiotic use.     Physical Exam   Triage Vital Signs: ED Triage Vitals [01/01/22 1224]  Enc Vitals Group     BP (!) 143/99     Pulse Rate (!) 123     Resp 18     Temp (!) 97.5 F (36.4 C)     Temp Source Oral     SpO2 98 %     Weight      Height      Head Circumference      Peak Flow      Pain Score 8     Pain Loc      Pain Edu?      Excl. in [place]?     Most recent vital signs: Vitals:   01/01/22 1224 01/01/22 1355  BP: (!) 143/99 (!) 193/110  Pulse: (!) 123 (!) 116  Resp: 18 18  Temp: (!) 97.5 F (36.4 C) 98.2 F (36.8 C)  SpO2: 98% 97%    Physical Exam Constitutional:      Appearance: He is well-developed.  HENT:     Head: Atraumatic.  Eyes:     Conjunctiva/sclera: Conjunctivae normal.  Cardiovascular:     Rate and Rhythm: Regular rhythm.  Pulmonary:     Effort: No respiratory distress.  Musculoskeletal:     Cervical back: Normal range of motion.     Comments: +2 DP pulses bilaterally.  Left foot with erythema warmth and induration.  No crepitance.  Skin:    General: Skin is warm.  Neurological:     Mental Status: He is alert. Mental status is at baseline.             IMPRESSION / MDM / ASSESSMENT AND PLAN / ED COURSE  I reviewed the triage vital signs and the nursing notes.  Differential diagnosis including cellulitis,  osteomyelitis, diabetic foot wound.  Clinical picture is not consistent with a necrotizing soft tissue infection.  Patient does have good peripheral pulses to the left foot.  On chart review of prior hospitalizations does have history of peripheral arterial disease.  Also has a history of diabetes.  Blood cultures obtained.  Patient tachycardic on arrival.  X-ray of the left foot concerning for osteomyelitis.  Patient given IV fluids.  Started on antibiotics to cover for osteomyelitis.  Patient with hyperglycemia but does not meet criteria for DKA.  Given IV fluids.  Given IV fentanyl for pain control.     EKG  Sinus tachycardia on cardiac telemetry with no tachycardic or bradycardic dysrhythmias.  RADIOLOGY I independently reviewed imaging, my interpretation of imaging: X-ray of the left foot with bone changes to the foot.  Read as acute osteomyelitis.    ED Results / Procedures / Treatments   Labs (all labs ordered are listed, but only abnormal results are displayed) Labs  interpreted as -   Mild hyponatremia.  Elevated anion gap but does not meet criteria for DKA.  Does have hyperglycemia with a glucose elevated in the 500s.  Normal bicarb.  Lactic acid elevated at 2.5.  Labs Reviewed  COMPREHENSIVE METABOLIC PANEL - Abnormal; Notable for the following components:      Result Value   Sodium 130 (*)    Chloride 88 (*)    Glucose, Bld 549 (*)    Total Protein 9.3 (*)    Alkaline Phosphatase 187 (*)    Anion gap 16 (*)    All other components within normal limits  LACTIC ACID, PLASMA - Abnormal; Notable for the following components:   Lactic Acid, Venous 2.5 (*)    All other components within normal limits  CBC WITH DIFFERENTIAL/PLATELET - Abnormal; Notable for the following components:   RBC 4.09 (*)    Hemoglobin 12.6 (*)    HCT 36.5 (*)    Abs Immature Granulocytes 0.10 (*)    All other components within normal limits  CULTURE, BLOOD (ROUTINE X 2)  CULTURE, BLOOD  (ROUTINE X 2)  PROTIME-INR  LACTIC ACID, PLASMA  URINALYSIS, ROUTINE W REFLEX MICROSCOPIC    Consulted the hospitalist to evaluate the patient in the emergency department.  Patient will be admitted for acute osteomyelitis and hyperglycemia.   PROCEDURES:  Critical Care performed: Yes  Procedures  Total critical care time: 35 minutes  Critical care time was exclusive of separately billable procedures and treating other patients.  Critical care was necessary to treat or prevent imminent or life-threatening deterioration.  Critical care was time spent personally by me on the following activities: development of treatment plan with patient and/or surrogate as well as nursing, discussions with consultants, evaluation of patient's response to treatment, examination of patient, obtaining history from patient or surrogate, ordering and performing treatments and interventions, ordering and review of laboratory studies, ordering and review of radiographic studies, pulse oximetry and re-evaluation of patient's condition.   Patient's presentation is most consistent with acute presentation with potential threat to life or bodily function.   MEDICATIONS ORDERED IN ED: Medications  vancomycin (VANCOREADY) IVPB 1250 mg/250 mL (1,250 mg Intravenous New Bag/Given 01/01/22 1532)  ceFEPIme (MAXIPIME) 2 g in sodium chloride 0.9 % 100 mL IVPB (0 g Intravenous Stopped 01/01/22 1510)  sodium chloride flush (NS) 0.9 % injection 3 mL (3 mLs Intravenous Not Given 01/01/22 1534)  acetaminophen (TYLENOL) tablet 650 mg (has no administration in time range)    Or  acetaminophen (TYLENOL) suppository 650 mg (has no administration in time range)  ondansetron (ZOFRAN) tablet 4 mg (has no administration in time range)    Or  ondansetron (ZOFRAN) injection 4 mg (has no administration in time range)  polyethylene glycol (MIRALAX / GLYCOLAX) packet 17 g (has no administration in time range)  multivitamin with  minerals tablet 1 tablet (has no administration in time range)  folic acid (FOLVITE) tablet 1 mg (has no administration in time range)  thiamine (VITAMIN B1) tablet 100 mg (has no administration in time range)  metroNIDAZOLE (FLAGYL) IVPB 500 mg (has no administration in time range)  vancomycin (VANCOREADY) IVPB 500 mg/100 mL (has no administration in time range)  fentaNYL (SUBLIMAZE) injection 50 mcg (50 mcg Intravenous Given 01/01/22 1351)  sodium chloride 0.9 % bolus 1,500 mL (1,500 mLs Intravenous New Bag/Given 01/01/22 1533)    FINAL CLINICAL IMPRESSION(S) / ED DIAGNOSES   Final diagnoses:  Cellulitis of left foot  Osteomyelitis of left  foot, unspecified type (HCC)  Hyperglycemia     Rx / DC Orders   ED Discharge Orders     None        Note:  This document was prepared using Dragon voice recognition software and may include unintentional dictation errors.   Corena Herter, MD 01/01/22 1624

## 2022-01-02 DIAGNOSIS — M869 Osteomyelitis, unspecified: Secondary | ICD-10-CM

## 2022-01-02 DIAGNOSIS — L03116 Cellulitis of left lower limb: Secondary | ICD-10-CM

## 2022-01-02 DIAGNOSIS — M8610 Other acute osteomyelitis, unspecified site: Secondary | ICD-10-CM

## 2022-01-02 DIAGNOSIS — R739 Hyperglycemia, unspecified: Secondary | ICD-10-CM

## 2022-01-02 LAB — COMPREHENSIVE METABOLIC PANEL
ALT: 16 U/L (ref 0–44)
AST: 23 U/L (ref 15–41)
Albumin: 2.8 g/dL — ABNORMAL LOW (ref 3.5–5.0)
Alkaline Phosphatase: 144 U/L — ABNORMAL HIGH (ref 38–126)
Anion gap: 10 (ref 5–15)
BUN: 15 mg/dL (ref 6–20)
CO2: 27 mmol/L (ref 22–32)
Calcium: 9.1 mg/dL (ref 8.9–10.3)
Chloride: 94 mmol/L — ABNORMAL LOW (ref 98–111)
Creatinine, Ser: 0.67 mg/dL (ref 0.61–1.24)
GFR, Estimated: >60 mL/min (ref >60)
Glucose, Bld: 299 mg/dL — ABNORMAL HIGH (ref 70–99)
Potassium: 3.8 mmol/L (ref 3.5–5.1)
Sodium: 131 mmol/L — ABNORMAL LOW (ref 135–145)
Total Bilirubin: 0.8 mg/dL (ref 0.3–1.2)
Total Protein: 6.8 g/dL (ref 6.5–8.1)

## 2022-01-02 LAB — CBC WITH DIFFERENTIAL/PLATELET
Abs Immature Granulocytes: 0.06 10*3/uL (ref 0.00–0.07)
Basophils Absolute: 0 10*3/uL (ref 0.0–0.1)
Basophils Relative: 0 %
Eosinophils Absolute: 0.1 10*3/uL (ref 0.0–0.5)
Eosinophils Relative: 1 %
HCT: 29.5 % — ABNORMAL LOW (ref 39.0–52.0)
Hemoglobin: 10.2 g/dL — ABNORMAL LOW (ref 13.0–17.0)
Immature Granulocytes: 1 %
Lymphocytes Relative: 25 %
Lymphs Abs: 1.8 10*3/uL (ref 0.7–4.0)
MCH: 30.9 pg (ref 26.0–34.0)
MCHC: 34.6 g/dL (ref 30.0–36.0)
MCV: 89.4 fL (ref 80.0–100.0)
Monocytes Absolute: 0.9 10*3/uL (ref 0.1–1.0)
Monocytes Relative: 13 %
Neutro Abs: 4.5 10*3/uL (ref 1.7–7.7)
Neutrophils Relative %: 60 %
Platelets: 277 10*3/uL (ref 150–400)
RBC: 3.3 MIL/uL — ABNORMAL LOW (ref 4.22–5.81)
RDW: 11.7 % (ref 11.5–15.5)
WBC: 7.5 10*3/uL (ref 4.0–10.5)
nRBC: 0 % (ref 0.0–0.2)

## 2022-01-02 LAB — GLUCOSE, CAPILLARY
Glucose-Capillary: 251 mg/dL — ABNORMAL HIGH (ref 70–99)
Glucose-Capillary: 263 mg/dL — ABNORMAL HIGH (ref 70–99)
Glucose-Capillary: 243 mg/dL — ABNORMAL HIGH (ref 70–99)

## 2022-01-02 LAB — HEMOGLOBIN A1C
Hgb A1c MFr Bld: 14.4 % — ABNORMAL HIGH (ref 4.8–5.6)
Mean Plasma Glucose: 366.58 mg/dL

## 2022-01-02 LAB — OSMOLALITY: Osmolality: 288 mOsm/kg (ref 275–295)

## 2022-01-02 MED ORDER — VANCOMYCIN HCL 750 MG/150ML IV SOLN
750.0000 mg | Freq: Two times a day (BID) | INTRAVENOUS | Status: DC
  Administered 2022-01-02 – 2022-01-04 (×4): 750 mg via INTRAVENOUS
  Filled 2022-01-02 (×4): qty 150

## 2022-01-02 MED ORDER — LOSARTAN POTASSIUM 50 MG PO TABS
50.0000 mg | ORAL_TABLET | Freq: Every day | ORAL | Status: DC
  Administered 2022-01-02: 50 mg via ORAL
  Filled 2022-01-02: qty 1

## 2022-01-02 MED ORDER — INSULIN ASPART 100 UNIT/ML IJ SOLN
0.0000 [IU] | Freq: Three times a day (TID) | INTRAMUSCULAR | Status: DC
  Administered 2022-01-02: 11 [IU] via SUBCUTANEOUS
  Filled 2022-01-02: qty 1

## 2022-01-02 MED ORDER — HYDROMORPHONE HCL 1 MG/ML IJ SOLN
1.0000 mg | Freq: Four times a day (QID) | INTRAMUSCULAR | Status: DC | PRN
  Administered 2022-01-03: 1 mg via INTRAVENOUS
  Filled 2022-01-02: qty 1

## 2022-01-02 MED ORDER — INSULIN ASPART 100 UNIT/ML IJ SOLN
0.0000 [IU] | Freq: Three times a day (TID) | INTRAMUSCULAR | Status: DC
  Administered 2022-01-02: 7 [IU] via SUBCUTANEOUS
  Administered 2022-01-03: 11 [IU] via SUBCUTANEOUS
  Filled 2022-01-02 (×2): qty 1

## 2022-01-02 MED ORDER — INSULIN GLARGINE-YFGN 100 UNIT/ML ~~LOC~~ SOLN
20.0000 [IU] | Freq: Every day | SUBCUTANEOUS | Status: DC
  Administered 2022-01-02 – 2022-01-03 (×2): 20 [IU] via SUBCUTANEOUS
  Filled 2022-01-02 (×3): qty 0.2

## 2022-01-02 MED ORDER — INSULIN ASPART 100 UNIT/ML IJ SOLN
0.0000 [IU] | Freq: Three times a day (TID) | INTRAMUSCULAR | Status: AC
  Administered 2022-01-02: 11 [IU] via SUBCUTANEOUS
  Filled 2022-01-02: qty 1

## 2022-01-02 MED ORDER — LOSARTAN POTASSIUM 50 MG PO TABS
100.0000 mg | ORAL_TABLET | Freq: Every day | ORAL | Status: DC
  Administered 2022-01-04 – 2022-01-05 (×2): 100 mg via ORAL
  Filled 2022-01-02 (×3): qty 2

## 2022-01-02 NOTE — Consult Note (Signed)
Pharmacy Antibiotic Note  Cody Cowan is a 56 y.o. male admitted on 01/01/2022 with  a suspected diabetic foot infection .  Pharmacy has been consulted for vancomycin and cefepime dosing.  Scr 1.01 (baseline 0.5-0.7s)  Plan: Vancomycin 1250mg  IV LD, then vancomycin 500mg  IV Q12H. Improvement in Scr. Will adjust vancomycin to 750 mg q12H. Predicted AUC of 556. Goal AUC of 400-550. Scr 0.8, TBW, Vd 0.72. Plan to obtain vancomycin after 4th or 5th dose.   Continue cefepime 2gm IV Q8H  Continue Flagyl 500 mg IV BID  Height: 5\' 10"  (177.8 cm) Weight: 53.5 kg (118 lb) IBW/kg (Calculated) : 73  Temp (24hrs), Avg:98.4 F (36.9 C), Min:97.5 F (36.4 C), Max:98.9 F (37.2 C)  Recent Labs  Lab 01/01/22 1225 01/01/22 1723 01/02/22 0437  WBC 8.2  --  7.5  CREATININE 1.01  --  0.67  LATICACIDVEN 2.5* 1.0  --      Estimated Creatinine Clearance: 78.9 mL/min (by C-G formula based on SCr of 0.67 mg/dL).    No Known Allergies  Antimicrobials this admission: Vancomycin 10/28 >>  Cefepime 10/28 >>  Metronidazole 10/28 >>  Dose adjustments this admission: N/A  Microbiology results: 10/28 BCx: pending   Thank you for allowing pharmacy to be a part of this patient's care.  Oswald Hillock, PharmD, BCPS 01/02/2022 10:55 AM

## 2022-01-02 NOTE — Plan of Care (Signed)

## 2022-01-02 NOTE — Progress Notes (Signed)
PROGRESS NOTE  Cody Cowan    DOB: October 13, 1965, 56 y.o.  RKY:[id number]    Code Status: Full Code   DOA: 01/01/2022   LOS: 1   Brief hospital course  Cody Cowan is a 56 y.o. male with a PMH significant for type 2 diabetes, hyperlipidemia, prior osteomyelitis. Patient has no PCP and unable to afford purchase medications or transportation to obtain medications or to go to PCP appointments which interferes with his chronic treatment adherence and likely directly resulted in needing hospitalization.   They presented from home to the ED on 01/01/2022 with infected left foot ulcer worsening x several days. Systemic symptoms include abdominal pain, nausea.  In the ED, it was found that they had hypertensive at 143/99, with heart rate of 123.  He was afebrile at 98.2.  He was saturating at 98% on room air..  Significant findings included glucose of 549, lactic acidosis of 2.5.  Left foot x-ray obtained that demonstrates acute osteomyelitis.  They were initially treated with IV Abx. Podiatry was consulted for evaluation and management.   Patient was admitted to medicine service for further workup and management of acute osteomyelitis as outlined in detail below.  01/02/22 -stable  Assessment & Plan  Principal Problem:   Acute osteomyelitis (HCC) Active Problems:   Hyperglycemic crisis in diabetes mellitus (HCC)   Uncontrolled type 2 diabetes mellitus with hyperglycemia, without long-term current use of insulin (HCC)   AKI (acute kidney injury) (HCC)   Hypertension   EtOH dependence (HCC)  Acute osteomyelitis (HCC)- MRI this admission showing active osteomyelitis in proximal and distal phalanges of left great toe, 5th phalanx and 5th metatarsal head, and soft tissue involvement. Signs of probable osteomyelitis of the second and third digits as well. Osteomyelitis in the same foot in May 2023 grew Klebsiella pneumoniae, E. coli, Enterococcus faecalis, and Bacteroides  uniformis.  - Podiatry consulted; appreciate their recommendations - Continue broad-spectrum antibiotics with Vanco, cefepime, metronidazole - Blood cultures pending - Will need surgical cultures to narrow antibiotics   Hyperglycemic crisis in diabetes mellitus (HCC)- A1c this admission 14.4 - Semglee 20 units daily - SSI, resistant   AKI (acute kidney injury) (HCC)- resolved In the setting of acute infection and severe hyperglycemia. - Repeat BMP in the a.m.   Hypertension - initiated losartan in setting of chronic comorbidities and normalized kidney function today   Access to care barriers-  - TOC engaged for assistance with medications, PCP, and transportation to medical appointments after dc   -CIWA without Ativan  Body mass index is 16.93 kg/m.  VTE ppx: SCDs Start: 01/01/22 1528   Diet:     Diet   Diet Carb Modified Fluid consistency: Thin; Room service appropriate? Yes   Consultants: podiatry Subjective 01/02/22    Pt reports minimal pain currently. He has some nausea. He is grateful for our help.   Spanish Interpretor utilized for entirety of our encounter.   Objective   Vitals:   01/01/22 1827 01/01/22 2051 01/02/22 0012 01/02/22 0458  BP: (!) 180/133 128/83 (!) 168/99 (!) 164/99  Pulse: (!) 114 (!) 106 97 95  Resp:  16 18 16   Temp:  98.3 F (36.8 C) 98.9 F (37.2 C) 98.2 F (36.8 C)  TempSrc:      SpO2: 99% 95% 97% 99%  Weight:      Height:        Intake/Output Summary (Last 24 hours) at 01/02/2022 0742 Last data filed at 01/02/2022 0602 Gross per 24  hour  Intake 1234.48 ml  Output 200 ml  Net 1034.48 ml   Filed Weights   01/01/22 1356  Weight: 53.5 kg     Physical Exam:  General: awake, alert, NAD HEENT: atraumatic, clear conjunctiva, anicteric sclera, MMM, hearing grossly normal Respiratory: normal respiratory effort. Cardiovascular: quick capillary refill, normal S1/S2, RRR, no JVD, murmurs Gastrointestinal: soft, NT,  ND Nervous: A&O x3. no gross focal neurologic deficits, normal speech Extremities: left foot bandaged in clean, dry dressing Skin: dry, intact, normal temperature, normal color. No rashes, lesions or ulcers on exposed skin Psychiatry: normal mood, congruent affect  Labs   I have personally reviewed the following labs and imaging studies CBC    Component Value Date/Time   WBC 7.5 01/02/2022 0437   RBC 3.30 (L) 01/02/2022 0437   HGB 10.2 (L) 01/02/2022 0437   HCT 29.5 (L) 01/02/2022 0437   PLT 277 01/02/2022 0437   MCV 89.4 01/02/2022 0437   MCH 30.9 01/02/2022 0437   MCHC 34.6 01/02/2022 0437   RDW 11.7 01/02/2022 0437   LYMPHSABS 1.8 01/02/2022 0437   MONOABS 0.9 01/02/2022 0437   EOSABS 0.1 01/02/2022 0437   BASOSABS 0.0 01/02/2022 0437      Latest Ref Rng & Units 01/02/2022    4:37 AM 01/01/2022   12:25 PM 08/08/2021    4:38 AM  BMP  Glucose 70 - 99 mg/dL 299  549    BUN 6 - 20 mg/dL 15  16    Creatinine 0.61 - 1.24 mg/dL 0.67  1.01  0.59   Sodium 135 - 145 mmol/L 131  130    Potassium 3.5 - 5.1 mmol/L 3.8  4.5    Chloride 98 - 111 mmol/L 94  88    CO2 22 - 32 mmol/L 27  26    Calcium 8.9 - 10.3 mg/dL 9.1  9.9      DG Foot Complete Left  Result Date: 01/01/2022 CLINICAL DATA:  Foot infection for 1 week.  History of diabetes. EXAM: LEFT FOOT - COMPLETE 3+ VIEW COMPARISON:  08/03/2021. FINDINGS: Soft tissue ulceration and air lies lateral to the fifth metatarsophalangeal joint. There is resorption along the lateral base of the fifth toe proximal phalanx and the adjacent lateral fifth metatarsal head consistent with osteomyelitis. There is additional soft tissue air along the proximal great toe, most evident laterally just proximal to the IP joint. There is more subtle cortical resorption along the lateral distal margin of the proximal phalanx, also consistent with osteomyelitis. No other convincing osteomyelitis.  No fracture. Previous amputation of the fourth toe. Joints  are normally aligned. Small plantar and dorsal calcaneal spurs. IMPRESSION: 1. Osteomyelitis of the lateral base of the fifth toe proximal phalanx, lateral aspect of the fifth metatarsal head and more subtly along the lateral margin of the great toe proximal phalanx distally near the IP joint. Electronically Signed   By: Lajean Manes M.D.   On: 01/01/2022 13:54   DG Chest 2 View  Result Date: 01/01/2022 CLINICAL DATA:  Foot infection for a week.  Cough and chest pain. EXAM: CHEST - 2 VIEW COMPARISON:  01/14/2020. FINDINGS: Normal heart, mediastinum and hila. Clear lungs.  No pleural effusion or pneumothorax. Skeletal structures are intact. IMPRESSION: No active cardiopulmonary disease. Electronically Signed   By: Lajean Manes M.D.   On: 01/01/2022 13:01    Disposition Plan & Communication  Patient status: Inpatient  Admitted From: Home Planned disposition location: TBD Anticipated discharge date: 10/31 pending podiatry  clearance  Family Communication: none     Author: Leeroy Bock, DO [facility] 01/02/2022, 7:42 AM   Available by Epic secure chat 7AM-7PM. If 7PM-7AM, please contact night-coverage.  [facility] contact information found on [email or web address].

## 2022-01-02 NOTE — Consult Note (Signed)
PODIATRY CONSULTATION  NAME Raney Koeppen MRN [id number] DOB 06-30-1965 DOA 01/01/2022   Reason for consult: OM left foot Chief Complaint  Patient presents with   Wound Infection    Consulting physician: Jose Persia MD  History of present illness: 56 y.o. 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  Podiatry consulted.  Patient resting comfortably this morning.  Past Medical History:  Diagnosis Date   Diabetes ([place])        Latest Ref Rng & Units 01/02/2022    4:37 AM 01/01/2022   12:25 PM 08/03/2021    4:09 AM  CBC  WBC 4.0 - 10.5 K/uL 7.5  8.2  8.6   Hemoglobin 13.0 - 17.0 g/dL 10.2  12.6  12.2   Hematocrit 39.0 - 52.0 % 29.5  36.5  35.7   Platelets 150 - 400 K/uL 277  388  287        Latest Ref Rng & Units 01/02/2022    4:37 AM 01/01/2022   12:25 PM 08/08/2021    4:38 AM  BMP  Glucose 70 - 99 mg/dL 299  549    BUN 6 - 20 mg/dL 15  16    Creatinine 0.61 - 1.24 mg/dL 0.67  1.01  0.59   Sodium 135 - 145 mmol/L 131  130    Potassium 3.5 - 5.1 mmol/L 3.8  4.5    Chloride 98 - 111 mmol/L 94  88    CO2 22 - 32 mmol/L 27  26    Calcium 8.9 - 10.3 mg/dL 9.1  9.9         Physical Exam: General: The patient is alert and oriented x3 in no acute distress.   Dermatology: Ulceration with eschar and drainage noted encompassing all of the remaining digits of the left foot.  Please see above photo.  Vascular: Clinically there is no concern for vascular compromise.  Pulses palpable.  Neurological: Diminished via light touch  Musculoskeletal Exam: Prior fourth toe amputation left foot  XR FOOT LEFT 01/01/2022 IMPRESSION: 1. Osteomyelitis of the lateral base of the fifth toe proximal phalanx, lateral aspect of the fifth metatarsal head and more subtly along the lateral margin of the great toe proximal phalanx distally near the IP joint.  MR FOOT LT W WO  CONTRAST 01/01/2022 IMPRESSION: 1. Active osteomyelitis of the fifth metatarsal head, proximal phalanx of the fifth toe, proximal and distal phalanges of the greattoe, and proximal phalanges of the second and  Third toes. 2. Widespread subcutaneous edema and enhancement in the forefoot suggesting cellulitis. Possible sinus track extending from the fifth MTP joint to the cutaneous surface laterally. 3. Small amount of gas loculations in the plantar soft tissues at the great toe. 4. Regional muscular atrophy, cannot exclude mild neurogenic edema. 5. 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  ASSESSMENT/PLAN OF CARE Acute osteomyelitis left forefoot -Discussed with patient this morning transmetatarsal amputation of the left foot due to the acute onset of residual osteomyelitis throughout the forefoot.  Patient understands and agrees -Will plan for transmetatarsal amputation LT foot tomorrow.  Preoperative orders placed.  N.p.o. after midnight. -Continue cefepime 2 g every 8 hours as ordered -Will follow     Thank you for the consult.  Please contact me directly via secure chat with any questions or concerns.    Edrick Kins, [place]  Dr. Edrick Kins, DPM    [address], [place] [address]                Office [phone]  Fax [phone]

## 2022-01-03 ENCOUNTER — Inpatient Hospital Stay: Payer: Self-pay

## 2022-01-03 ENCOUNTER — Other Ambulatory Visit: Payer: Self-pay

## 2022-01-03 ENCOUNTER — Inpatient Hospital Stay: Payer: Self-pay | Admitting: Anesthesiology

## 2022-01-03 ENCOUNTER — Encounter
Admission: EM | Disposition: A | Discharge status: Home Health | Payer: Self-pay | Source: Home / Self Care | Attending: Student in an Organized Health Care Education/Training Program

## 2022-01-03 DIAGNOSIS — M8610 Other acute osteomyelitis, unspecified site: Secondary | ICD-10-CM

## 2022-01-03 HISTORY — PX: TRANSMETATARSAL AMPUTATION: SHX6197

## 2022-01-03 LAB — BASIC METABOLIC PANEL
Anion gap: 9 (ref 5–15)
BUN: 19 mg/dL (ref 6–20)
CO2: 27 mmol/L (ref 22–32)
Calcium: 9.2 mg/dL (ref 8.9–10.3)
Chloride: 96 mmol/L — ABNORMAL LOW (ref 98–111)
Creatinine, Ser: 0.7 mg/dL (ref 0.61–1.24)
GFR, Estimated: >60 mL/min (ref >60)
Glucose, Bld: 310 mg/dL — ABNORMAL HIGH (ref 70–99)
Potassium: 3.8 mmol/L (ref 3.5–5.1)
Sodium: 132 mmol/L — ABNORMAL LOW (ref 135–145)

## 2022-01-03 LAB — GLUCOSE, CAPILLARY
Glucose-Capillary: 335 mg/dL — ABNORMAL HIGH (ref 70–99)
Glucose-Capillary: 294 mg/dL — ABNORMAL HIGH (ref 70–99)
Glucose-Capillary: 106 mg/dL — ABNORMAL HIGH (ref 70–99)
Glucose-Capillary: 69 mg/dL — ABNORMAL LOW (ref 70–99)
Glucose-Capillary: 147 mg/dL — ABNORMAL HIGH (ref 70–99)
Glucose-Capillary: 129 mg/dL — ABNORMAL HIGH (ref 70–99)

## 2022-01-03 SURGERY — AMPUTATION, FOOT, TRANSMETATARSAL
Anesthesia: General | Site: Foot | Laterality: Left

## 2022-01-03 MED ORDER — PHENYLEPHRINE HCL (PRESSORS) 10 MG/ML IV SOLN
INTRAVENOUS | Status: DC | PRN
  Administered 2022-01-03 (×3): 80 ug via INTRAVENOUS
  Administered 2022-01-03: 160 ug via INTRAVENOUS

## 2022-01-03 MED ORDER — MIDAZOLAM HCL 5 MG/5ML IJ SOLN
INTRAMUSCULAR | Status: DC | PRN
  Administered 2022-01-03: 2 mg via INTRAVENOUS

## 2022-01-03 MED ORDER — ONDANSETRON HCL 4 MG/2ML IJ SOLN: 4.0000 mg | Freq: Once | INTRAMUSCULAR | Status: DC | PRN

## 2022-01-03 MED ORDER — LIDOCAINE HCL (PF) 1 % IJ SOLN
INTRAMUSCULAR | Status: DC | PRN
  Administered 2022-01-03: 16 mL via SURGICAL_CAVITY

## 2022-01-03 MED ORDER — DEXTROSE 50 % IV SOLN
12.5000 g | INTRAVENOUS | Status: AC
  Administered 2022-01-03: 12.5 g via INTRAVENOUS
  Filled 2022-01-03: qty 50

## 2022-01-03 MED ORDER — VANCOMYCIN HCL IN DEXTROSE 1-5 GM/200ML-% IV SOLN
INTRAVENOUS | Status: AC
  Filled 2022-01-03: qty 200

## 2022-01-03 MED ORDER — ONDANSETRON HCL 4 MG/2ML IJ SOLN
INTRAMUSCULAR | Status: AC
  Filled 2022-01-03: qty 2

## 2022-01-03 MED ORDER — PROPOFOL 10 MG/ML IV BOLUS
INTRAVENOUS | Status: AC
  Filled 2022-01-03: qty 20

## 2022-01-03 MED ORDER — FENTANYL CITRATE (PF) 100 MCG/2ML IJ SOLN: 25.0000 ug | INTRAMUSCULAR | Status: DC | PRN

## 2022-01-03 MED ORDER — GLYCOPYRROLATE 0.2 MG/ML IJ SOLN
INTRAMUSCULAR | Status: DC | PRN
  Administered 2022-01-03: .2 mg via INTRAVENOUS

## 2022-01-03 MED ORDER — GLYCOPYRROLATE 0.2 MG/ML IJ SOLN
INTRAMUSCULAR | Status: AC
  Filled 2022-01-03: qty 1

## 2022-01-03 MED ORDER — 0.9 % SODIUM CHLORIDE (POUR BTL) OPTIME
TOPICAL | Status: DC | PRN
  Administered 2022-01-03: 750 mL

## 2022-01-03 MED ORDER — FENTANYL CITRATE (PF) 100 MCG/2ML IJ SOLN
INTRAMUSCULAR | Status: DC | PRN
  Administered 2022-01-03: 50 ug via INTRAVENOUS

## 2022-01-03 MED ORDER — SODIUM CHLORIDE 0.9 % IV SOLN: INTRAVENOUS | Status: DC | PRN

## 2022-01-03 MED ORDER — ACETAMINOPHEN 10 MG/ML IV SOLN: 650.0000 mg | Freq: Once | INTRAVENOUS | Status: DC | PRN

## 2022-01-03 MED ORDER — DEXAMETHASONE SODIUM PHOSPHATE 10 MG/ML IJ SOLN
INTRAMUSCULAR | Status: AC
  Filled 2022-01-03: qty 1

## 2022-01-03 MED ORDER — OXYCODONE HCL 5 MG/5ML PO SOLN: 5.0000 mg | Freq: Once | ORAL | Status: DC | PRN

## 2022-01-03 MED ORDER — LIDOCAINE HCL (PF) 1 % IJ SOLN
INTRAMUSCULAR | Status: AC
  Filled 2022-01-03: qty 30

## 2022-01-03 MED ORDER — LIVING WELL WITH DIABETES BOOK - IN SPANISH
Freq: Once | Status: AC
  Filled 2022-01-03: qty 1

## 2022-01-03 MED ORDER — LIDOCAINE HCL (PF) 2 % IJ SOLN
INTRAMUSCULAR | Status: AC
  Filled 2022-01-03: qty 5

## 2022-01-03 MED ORDER — ONDANSETRON HCL 4 MG/2ML IJ SOLN
INTRAMUSCULAR | Status: DC | PRN
  Administered 2022-01-03: 4 mg via INTRAVENOUS

## 2022-01-03 MED ORDER — FENTANYL CITRATE (PF) 100 MCG/2ML IJ SOLN
INTRAMUSCULAR | Status: AC
  Filled 2022-01-03: qty 2

## 2022-01-03 MED ORDER — BUPIVACAINE HCL (PF) 0.5 % IJ SOLN
INTRAMUSCULAR | Status: AC
  Filled 2022-01-03: qty 30

## 2022-01-03 MED ORDER — PROPOFOL 10 MG/ML IV BOLUS
INTRAVENOUS | Status: DC | PRN
  Administered 2022-01-03: 20 mg via INTRAVENOUS
  Administered 2022-01-03: 60 mg via INTRAVENOUS

## 2022-01-03 MED ORDER — BUPIVACAINE-EPINEPHRINE (PF) 0.5% -1:200000 IJ SOLN
INTRAMUSCULAR | Status: AC
  Filled 2022-01-03: qty 30

## 2022-01-03 MED ORDER — OXYCODONE HCL 5 MG PO TABS: 5.0000 mg | ORAL_TABLET | Freq: Once | ORAL | Status: DC | PRN

## 2022-01-03 MED ORDER — MIDAZOLAM HCL 2 MG/2ML IJ SOLN
INTRAMUSCULAR | Status: AC
  Filled 2022-01-03: qty 2

## 2022-01-03 MED ORDER — DEXAMETHASONE SODIUM PHOSPHATE 10 MG/ML IJ SOLN
INTRAMUSCULAR | Status: DC | PRN
  Administered 2022-01-03: 4 mg via INTRAVENOUS

## 2022-01-03 MED ORDER — LIDOCAINE HCL (PF) 2 % IJ SOLN
INTRAMUSCULAR | Status: DC | PRN
  Administered 2022-01-03: 50 mg

## 2022-01-03 MED ORDER — INSULIN STARTER KIT- PEN NEEDLES (SPANISH)
1.0000 | Freq: Once | Status: DC
  Filled 2022-01-03: qty 1

## 2022-01-03 SURGICAL SUPPLY — 56 items
BENZOIN TINCTURE PRP APPL 2/3 (GAUZE/BANDAGES/DRESSINGS) IMPLANT
BLADE SAW 9.0X10X.38 (BLADE) ×1 IMPLANT
BLADE SURG 10 STRL SS SAFETY (BLADE) IMPLANT
BNDG COHESIVE 4X5 TAN STRL LF (GAUZE/BANDAGES/DRESSINGS) ×1 IMPLANT
BNDG ELASTIC 4X5.8 VLCR NS LF (GAUZE/BANDAGES/DRESSINGS) ×1 IMPLANT
BNDG ELASTIC 4X5.8 VLCR STR LF (GAUZE/BANDAGES/DRESSINGS) ×1 IMPLANT
BNDG ESMARK 4X12 TAN STRL LF (GAUZE/BANDAGES/DRESSINGS) ×1 IMPLANT
BNDG GAUZE DERMACEA FLUFF 4 (GAUZE/BANDAGES/DRESSINGS) ×1 IMPLANT
BNDG STRETCH 4X75 STRL LF (GAUZE/BANDAGES/DRESSINGS) ×1 IMPLANT
CUFF TOURN SGL QUICK 18X4 (TOURNIQUET CUFF) ×1 IMPLANT
CUFF TOURN SGL QUICK 24 (TOURNIQUET CUFF) ×1
CUFF TRNQT CYL 24X4X16.5-23 (TOURNIQUET CUFF) ×1 IMPLANT
DRAIN PENROSE 12X.25 LTX STRL (MISCELLANEOUS) IMPLANT
DRAPE FLUOR MINI C-ARM 54X84 (DRAPES) IMPLANT
DRSG ADAPTIC 3X8 NADH LF (GAUZE/BANDAGES/DRESSINGS) ×1 IMPLANT
DRSG EMULSION OIL 3X8 NADH (GAUZE/BANDAGES/DRESSINGS) IMPLANT
DRSG XEROFORM 1X8 (GAUZE/BANDAGES/DRESSINGS) IMPLANT
DURAPREP 26ML APPLICATOR (WOUND CARE) ×1 IMPLANT
ELECT REM PT RETURN 9FT ADLT (ELECTROSURGICAL) ×1
ELECTRODE REM PT RTRN 9FT ADLT (ELECTROSURGICAL) ×1 IMPLANT
GAUZE PACKING IODOFORM 1/2INX (GAUZE/BANDAGES/DRESSINGS) IMPLANT
GAUZE SPONGE 4X4 12PLY STRL (GAUZE/BANDAGES/DRESSINGS) ×2 IMPLANT
GLOVE BIOGEL PI IND STRL 8 (GLOVE) ×1 IMPLANT
GLOVE SURG ENC TEXT LTX SZ8 (GLOVE) ×1 IMPLANT
GOWN STRL REUS W/ TWL XL LVL3 (GOWN DISPOSABLE) ×1 IMPLANT
GOWN STRL REUS W/TWL XL LVL3 (GOWN DISPOSABLE) ×1
HANDLE YANKAUER SUCT BULB TIP (MISCELLANEOUS) IMPLANT
JACKSON PRATT 10 (INSTRUMENTS) ×1 IMPLANT
KIT TURNOVER KIT A (KITS) ×1 IMPLANT
LABEL OR SOLS (LABEL) IMPLANT
MANIFOLD NEPTUNE II (INSTRUMENTS) ×1 IMPLANT
NDL FILTER BLUNT 18X1 1/2 (NEEDLE) ×1 IMPLANT
NDL SAFETY ECLIP 18X1.5 (MISCELLANEOUS) ×1 IMPLANT
NEEDLE FILTER BLUNT 18X1 1/2 (NEEDLE) ×1 IMPLANT
NS IRRIG 500ML POUR BTL (IV SOLUTION) ×1 IMPLANT
PACK EXTREMITY ARMC (MISCELLANEOUS) ×1 IMPLANT
PAD ABD DERMACEA PRESS 5X9 (GAUZE/BANDAGES/DRESSINGS) ×2 IMPLANT
PULSAVAC PLUS IRRIG FAN TIP (DISPOSABLE) ×1
SOL PREP PVP 2OZ (MISCELLANEOUS) ×1
SOLUTION PREP PVP 2OZ (MISCELLANEOUS) ×1 IMPLANT
SPONGE T-LAP 18X18 ~~LOC~~+RFID (SPONGE) IMPLANT
STAPLER SKIN PROX 35W (STAPLE) IMPLANT
STOCKINETTE M/LG 89821 (MISCELLANEOUS) ×1 IMPLANT
SUT ETHILON 2 0 FS 18 (SUTURE) IMPLANT
SUT ETHILON 3-0 FS-10 30 BLK (SUTURE) ×1
SUT MNCRL 3-0 VIOLET CT-1 (SUTURE) IMPLANT
SUT MONOCRYL 3-0 (SUTURE) ×1
SUT PDS PLUS 2 (SUTURE) ×1
SUT PDS PLUS AB 2-0 CT-1 (SUTURE) IMPLANT
SUT PROLENE 3 0 PS 2 (SUTURE) IMPLANT
SUTURE EHLN 3-0 FS-10 30 BLK (SUTURE) IMPLANT
SWAB CULTURE AMIES ANAERIB BLU (MISCELLANEOUS) IMPLANT
SYR 10ML LL (SYRINGE) ×2 IMPLANT
TIP FAN IRRIG PULSAVAC PLUS (DISPOSABLE) ×1 IMPLANT
TRAP FLUID SMOKE EVACUATOR (MISCELLANEOUS) ×1 IMPLANT
WATER STERILE IRR 500ML POUR (IV SOLUTION) ×1 IMPLANT

## 2022-01-03 NOTE — Progress Notes (Signed)
PROGRESS NOTE  Cody Cowan    DOB: 1965/04/03, 56 y.o.  ONG:[id number]    Code Status: Full Code   DOA: 01/01/2022   LOS: 2   Brief hospital course  Cody Cowan is a 56 y.o. male with a PMH significant for type 2 diabetes, hyperlipidemia, prior osteomyelitis. Patient has no PCP and unable to afford purchase medications or transportation to obtain medications or to go to PCP appointments which interferes with his chronic treatment adherence and likely directly resulted in needing hospitalization.   They presented from home to the ED on 01/01/2022 with infected left foot ulcer worsening x several days. Systemic symptoms include abdominal pain, nausea.  In the ED, it was found that they had hypertensive at 143/99, with heart rate of 123.  He was afebrile at 98.2.  He was saturating at 98% on room air..  Significant findings included glucose of 549, lactic acidosis of 2.5.  Left foot x-ray obtained that demonstrates acute osteomyelitis.  They were initially treated with IV Abx. Podiatry was consulted for evaluation and management.   Patient was admitted to medicine service for further workup and management of acute osteomyelitis as outlined in detail below.  01/03/22 -stable. OR scheduled today.  Assessment & Plan  Principal Problem:   Acute osteomyelitis (HCC) Active Problems:   Hyperglycemic crisis in diabetes mellitus (HCC)   Uncontrolled type 2 diabetes mellitus with hyperglycemia, without long-term current use of insulin (HCC)   AKI (acute kidney injury) (HCC)   Hypertension   EtOH dependence (HCC)   Cellulitis of left foot  Acute osteomyelitis (HCC)- MRI this admission showing active osteomyelitis in proximal and distal phalanges of left great toe, 5th phalanx and 5th metatarsal head, and soft tissue involvement. Signs of probable osteomyelitis of the second and third digits as well. Osteomyelitis in the same foot in May 2023 grew Klebsiella pneumoniae,  E. coli, Enterococcus faecalis, and Bacteroides uniformis.  - Podiatry consulted; appreciate their recommendations  - amputation 10/30 - Continue broad-spectrum antibiotics with Vanco, cefepime, metronidazole - Blood cultures pending - Will need surgical cultures to narrow antibiotics   Hyperglycemic crisis in diabetes mellitus (HCC)- A1c this admission 14.4 - Semglee 20 units daily - SSI, resistant   AKI (acute kidney injury) (HCC)- resolved In the setting of acute infection and severe hyperglycemia. - Repeat BMP in the a.m.   Hypertension - initiated losartan in setting of chronic comorbidities and normalized kidney function today   Access to care barriers-  - TOC engaged for assistance with medications, PCP, and transportation to medical appointments after dc   -CIWA without Ativan  Body mass index is 16.93 kg/m.  VTE ppx: SCDs Start: 01/01/22 1528   Diet:     Diet   Diet NPO time specified   Consultants: podiatry Subjective 01/03/22    Pt reports no complaints other than he's thirsty.  He declined spanish interpretor for our discussion.    Objective   Vitals:   01/02/22 0458 01/02/22 0757 01/02/22 1522 01/02/22 2322  BP: (!) 164/99 (!) 186/101 134/83 (!) 151/93  Pulse: 95 91 100 91  Resp: 16 16 16 16   Temp: 98.2 F (36.8 C) 98.6 F (37 C) 98.1 F (36.7 C) 99.4 F (37.4 C)  TempSrc:      SpO2: 99% 100% 100% 98%  Weight:      Height:        Intake/Output Summary (Last 24 hours) at 01/03/2022 0740 Last data filed at 01/03/2022 0308 Gross per 24  hour  Intake 650.45 ml  Output 1300 ml  Net -649.55 ml    Filed Weights   01/01/22 1356  Weight: 53.5 kg     Physical Exam:  General: awake, alert, NAD HEENT: atraumatic, clear conjunctiva, anicteric sclera, MMM, hearing grossly normal Respiratory: normal respiratory effort. Cardiovascular: quick capillary refill, normal S1/S2, RRR, no JVD, murmurs Gastrointestinal: soft, NT, ND Nervous: A&O x3. no  gross focal neurologic deficits, normal speech Extremities: left foot bandaged in clean, dry dressing. Foul odor from foot.  Skin: dry, intact, normal temperature, normal color. No rashes, lesions or ulcers on exposed skin Psychiatry: normal mood, congruent affect  Labs   I have personally reviewed the following labs and imaging studies CBC    Component Value Date/Time   WBC 7.5 01/02/2022 0437   RBC 3.30 (L) 01/02/2022 0437   HGB 10.2 (L) 01/02/2022 0437   HCT 29.5 (L) 01/02/2022 0437   PLT 277 01/02/2022 0437   MCV 89.4 01/02/2022 0437   MCH 30.9 01/02/2022 0437   MCHC 34.6 01/02/2022 0437   RDW 11.7 01/02/2022 0437   LYMPHSABS 1.8 01/02/2022 0437   MONOABS 0.9 01/02/2022 0437   EOSABS 0.1 01/02/2022 0437   BASOSABS 0.0 01/02/2022 0437      Latest Ref Rng & Units 01/03/2022    3:55 AM 01/02/2022    4:37 AM 01/01/2022   12:25 PM  BMP  Glucose 70 - 99 mg/dL 310  299  549   BUN 6 - 20 mg/dL 19  15  16    Creatinine 0.61 - 1.24 mg/dL 0.70  0.67  1.01   Sodium 135 - 145 mmol/L 132  131  130   Potassium 3.5 - 5.1 mmol/L 3.8  3.8  4.5   Chloride 98 - 111 mmol/L 96  94  88   CO2 22 - 32 mmol/L 27  27  26    Calcium 8.9 - 10.3 mg/dL 9.2  9.1  9.9     MR FOOT LEFT W WO CONTRAST  Result Date: 01/02/2022 CLINICAL DATA:  Ongoing left foot ulcers, clinical concern for osteomyelitis EXAM: MRI OF THE LEFT FOREFOOT WITHOUT AND WITH CONTRAST TECHNIQUE: Multiplanar, multisequence MR imaging of the left forefoot was performed both before and after administration of intravenous contrast. CONTRAST:  6mL GADAVIST GADOBUTROL 1 MMOL/ML IV SOLN COMPARISON:  Radiographs 01/01/2022 FINDINGS: Despite efforts by the technologist and patient, prominent motion artifact is present on today's exam and could not be eliminated. This reduces exam sensitivity and specificity. Bones/Joint/Cartilage Prior fourth toe amputation at the level of the MTP joint. There is abnormal edema and enhancement in the head of  the fifth metatarsal and in the proximal phalanx of the fifth toe compatible with active osteomyelitis. Severe thinning of overlying cutaneous and subcutaneous structures potentially with lateral drainage as suggested on image 16 series 16. Associated substantial marrow enhancement. Active osteomyelitis in the proximal and distal phalanges of the great toe with accentuated T2 signal and associated enhancement. Extensive edema and some thinning in the soft tissues of the lateral great toe along the lateral margin of involvement near the interphalangeal joint. There is abnormal edema signal and osseous enhancement in the proximal phalanges of the second and third toes compatible with osteomyelitis. Ligaments The Lisfranc ligament appears intact. Muscles and Tendons Regional muscular atrophy, cannot exclude mild neurogenic edema. No intramuscular abscess observed. Soft tissues Widespread subcutaneous edema and enhancement in the forefoot suggesting cellulitis. Possible sinus track extending from the fifth MTP joint laterally to the  cutaneous surface. Small amount gas loculations in the plantar soft tissues at the great toe. IMPRESSION: 1. Active osteomyelitis of the fifth metatarsal head, proximal phalanx of the fifth toe, proximal and distal phalanges of the great toe, and proximal phalanges of the second and third toes. 2. Widespread subcutaneous edema and enhancement in the forefoot suggesting cellulitis. Possible sinus track extending from the fifth MTP joint to the cutaneous surface laterally. 3. Small amount of gas loculations in the plantar soft tissues at the great toe. 4. Regional muscular atrophy, cannot exclude mild neurogenic edema. 5. 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 Electronically Signed   By: Gaylyn Rong M.D.   On: 01/02/2022 09:16   DG Foot Complete Left  Result Date: 01/01/2022 CLINICAL DATA:  Foot infection for 1 week.  History of diabetes. EXAM: LEFT FOOT -  COMPLETE 3+ VIEW COMPARISON:  08/03/2021. FINDINGS: Soft tissue ulceration and air lies lateral to the fifth metatarsophalangeal joint. There is resorption along the lateral base of the fifth toe proximal phalanx and the adjacent lateral fifth metatarsal head consistent with osteomyelitis. There is additional soft tissue air along the proximal great toe, most evident laterally just proximal to the IP joint. There is more subtle cortical resorption along the lateral distal margin of the proximal phalanx, also consistent with osteomyelitis. No other convincing osteomyelitis.  No fracture. Previous amputation of the fourth toe. Joints are normally aligned. Small plantar and dorsal calcaneal spurs. IMPRESSION: 1. Osteomyelitis of the lateral base of the fifth toe proximal phalanx, lateral aspect of the fifth metatarsal head and more subtly along the lateral margin of the great toe proximal phalanx distally near the IP joint. Electronically Signed   By: Amie Portland M.D.   On: 01/01/2022 13:54   DG Chest 2 View  Result Date: 01/01/2022 CLINICAL DATA:  Foot infection for a week.  Cough and chest pain. EXAM: CHEST - 2 VIEW COMPARISON:  01/14/2020. FINDINGS: Normal heart, mediastinum and hila. Clear lungs.  No pleural effusion or pneumothorax. Skeletal structures are intact. IMPRESSION: No active cardiopulmonary disease. Electronically Signed   By: Amie Portland M.D.   On: 01/01/2022 13:01    Disposition Plan & Communication  Patient status: Inpatient  Admitted From: Home Planned disposition location: TBD Anticipated discharge date: 10/31 pending podiatry clearance  Family Communication: none     Author: Leeroy Bock, DO [facility] 01/03/2022, 7:40 AM   Available by Epic secure chat 7AM-7PM. If 7PM-7AM, please contact night-coverage.  [facility] contact information found on [email or web address].

## 2022-01-03 NOTE — Inpatient Diabetes Management (Addendum)
Inpatient Diabetes Program Recommendations  AACE/ADA: New Consensus Statement on Inpatient Glycemic Control (2015)  Target Ranges:  Prepandial:   less than 140 mg/dL      Peak postprandial:   less than 180 mg/dL (1-2 hours)      Critically ill patients:  140 - 180 mg/dL   Lab Results  Component Value Date   GLUCAP 294 (H) 01/03/2022   HGBA1C 14.4 (H) 01/01/2022    Review of Glycemic Control  Latest Reference Range & Units 01/02/22 07:33 01/02/22 12:23 01/02/22 16:38 01/03/22 06:33 01/03/22 07:56  Glucose-Capillary 70 - 99 mg/dL 251 (H) 263 (H) 243 (H) 335 (H) 294 (H)  (H): Data is abnormally high  Diabetes history: DM2 Outpatient Diabetes medications:  Basaglar 30 units BID Metformin 500 mg BID Current orders for Inpatient glycemic control:  Semglee 20 units QD Novolog 0-20 units TID  Inpatient Diabetes Program Recommendations:    Semglee 20 units BID Novolog 0-5 units QHS  Will speak with him regarding DM management and A1C of 14.4% on 10/31 after surgery.  No insurance/No PCP.  Elby Showers, TOC/RN a message.    Addendum@1502 :   Latest Reference Range & Units 01/03/22 07:56 01/03/22 11:58 01/03/22 14:06 01/03/22 14:53  Glucose-Capillary 70 - 99 mg/dL 294 (H) Novolog 11 units  Semglee 20 units 106 (H) 69 (L) 147 (H)  (H): Data is abnormally high (L): Data is abnormally low  Please consider decreasing Novolog correction to 0-15 units TID  Will continue to follow while inpatient.  Thank you, Reche Dixon, MSN, [facility] Coordinator Inpatient Diabetes Program [phone] (team pager from 8a-5p)

## 2022-01-03 NOTE — Anesthesia Procedure Notes (Signed)
Procedure Name: LMA Insertion Date/Time: 01/03/2022 5:36 PM  Performed by: Rolla Plate, CRNAPre-anesthesia Checklist: Patient identified, Patient being monitored, Timeout performed, Emergency Drugs available and Suction available Patient Re-evaluated:Patient Re-evaluated prior to induction Oxygen Delivery Method: Circle system utilized Preoxygenation: Pre-oxygenation with 100% oxygen Induction Type: IV induction Ventilation: Mask ventilation without difficulty LMA: LMA inserted LMA Size: 4.0 Tube type: Oral Number of attempts: 1 Placement Confirmation: positive ETCO2 and breath sounds checked- equal and bilateral Tube secured with: Tape Dental Injury: Teeth and Oropharynx as per pre-operative assessment

## 2022-01-03 NOTE — Brief Op Note (Signed)
01/03/2022  6:22 PM  PATIENT:  Cody Cowan  56 y.o. male  PRE-OPERATIVE DIAGNOSIS:    Osteomyelitis Left Foot  POST-OPERATIVE DIAGNOSIS:  Osteomyelitis Left Foot  PROCEDURE:  Procedure(s): TRANSMETATARSAL AMPUTATION (Left)  SURGEON:  Surgeon(s) and Role:    * Athanasia Stanwood, Stephan Minister, DPM - Primary  PHYSICIAN ASSISTANT:   ASSISTANTS: none   ANESTHESIA:   local and general  EBL:  10cc   BLOOD ADMINISTERED:none  DRAINS: none   LOCAL MEDICATIONS USED:  MARCAINE   , XYLOCAINE , and Amount: 8 ml each in ankle block  SPECIMEN:  deep tissue culture, forefoot for pathology  DISPOSITION OF SPECIMEN:  path/micro  COUNTS:  YES  TOURNIQUET:   Total Tourniquet Time Documented: Calf (Left) - 16 minutes Total: Calf (Left) - 16 minutes   DICTATION: .Note written in EPIC  PLAN OF CARE: Admit to inpatient   PATIENT DISPOSITION:  PACU - hemodynamically stable.   Delay start of Pharmacological VTE agent (>24hrs) due to surgical blood loss or risk of bleeding: no   - NWB LLE. PT ordered - Maintain splint - Expect surgical cure of osteomyelitis. Can d/c on 10 days PO abx per culture data - F/U 1 week in clinic w/ me. I will have office call for f/u  - Diet advanced

## 2022-01-03 NOTE — TOC Initial Note (Signed)
Transition of Care [facility]) - Initial/Assessment Note    Patient Details  Name: Cody Cowan MRN: [id number] Date of Birth: 04-14-1965  Transition of Care [facility]) CM/SW Contact:    Laurena Slimmer, RN Phone Number: 01/03/2022, 12:32 PM  Clinical Narrative:                  Transition of Care [facility]) Screening Note   Patient Details  Name: Cody Cowan Date of Birth: Mar 13, 1965   Transition of Care [facility]) CM/SW Contact:    Laurena Slimmer, RN Phone Number: 01/03/2022, 12:32 PM    Transition of Care Department [facility]) has reviewed patient and no TOC needs have been identified at this time. We will continue to monitor patient advancement through interdisciplinary progression rounds. If new patient transition needs arise, please place a TOC consult.          Patient Goals and CMS Choice        Expected Discharge Plan and Services                                                Prior Living Arrangements/Services                       Activities of Daily Living      Permission Sought/Granted                  Emotional Assessment              Admission diagnosis:  Hyperglycemia [R73.9] Cellulitis of left foot [L03.116] Acute osteomyelitis ([place]) [M86.10] Osteomyelitis of left foot, unspecified type ([place]) [M86.9] Patient Active Problem List   Diagnosis Date Noted   Cellulitis of left foot    Acute osteomyelitis ([place]) 01/01/2022   AKI (acute kidney injury) ([place]) 01/01/2022   Hyperglycemic crisis in diabetes mellitus ([place]) 01/01/2022   Hypertension 01/01/2022   Peripheral artery disease ([place]) 08/07/2021   Ulcer of left foot ([place])    Uncontrolled type 2 diabetes mellitus with hyperglycemia, without long-term current use of insulin ([place]) 08/02/2021   EtOH dependence ([place]) 08/02/2021   Osteomyelitis ([place]) 08/01/2021   Acute pancreatitis 02/28/2021   Protein-calorie malnutrition, severe 01/30/2021   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 ([address]), [place] [facility] - [address] [place]) [place] [address] Phone: [phone] Fax: [place] [address] [place] [address] Phone: [phone] Fax: [phone]     Social Determinants of Health (SDOH) Interventions    Readmission Risk Interventions     No data to display

## 2022-01-03 NOTE — Plan of Care (Signed)
  Problem: Pain Managment: Goal: General experience of comfort will improve Outcome: Progressing   Problem: Safety: Goal: Ability to remain free from injury will improve Outcome: Progressing   Problem: Skin Integrity: Goal: Risk for impaired skin integrity will decrease Outcome: Progressing   Problem: Nutrition: Goal: Adequate nutrition will be maintained Outcome: Progressing   Problem: Activity: Goal: Risk for activity intolerance will decrease Outcome: Progressing

## 2022-01-03 NOTE — Anesthesia Postprocedure Evaluation (Signed)
Anesthesia Post Note  Patient: Cody Cowan  Procedure(s) Performed: TRANSMETATARSAL AMPUTATION (Left: Foot)  Patient location during evaluation: PACU Anesthesia Type: General Level of consciousness: awake and alert Pain management: pain level controlled Vital Signs Assessment: post-procedure vital signs reviewed and stable Respiratory status: spontaneous breathing, nonlabored ventilation, respiratory function stable and patient connected to nasal cannula oxygen Cardiovascular status: blood pressure returned to baseline and stable Postop Assessment: no apparent nausea or vomiting Anesthetic complications: no   No notable events documented.   Last Vitals:  Vitals:   01/03/22 1923 01/03/22 1942  BP: (!) 162/97 (!) 128/90  Pulse: (!) 104 (!) 102  Resp: 17 18  Temp: 37.1 C 36.8 C  SpO2: 100% 100%    Last Pain:  Vitals:   01/03/22 1923  TempSrc:   PainSc: 0-No pain                 Arita Miss

## 2022-01-03 NOTE — Anesthesia Preprocedure Evaluation (Signed)
Anesthesia Evaluation  Patient identified by MRN, date of birth, ID band Patient awake    Reviewed: Allergy & Precautions, NPO status , Patient's Chart, lab work & pertinent test results  History of Anesthesia Complications Negative for: history of anesthetic complications  Airway Mallampati: III  TM Distance: >3 FB Neck ROM: full    Dental  (+) Poor Dentition, Chipped, Missing   Pulmonary neg pulmonary ROS, neg sleep apnea, neg COPD, Patient abstained from smoking.Not current smoker,    Pulmonary exam normal        Cardiovascular Exercise Tolerance: Poor METShypertension, + Peripheral Vascular Disease  (-) CAD and (-) Past MI (-) dysrhythmias  Rhythm:Regular Rate:Normal - Peripheral Edema    Neuro/Psych negative neurological ROS  negative psych ROS   GI/Hepatic neg GERD  ,(+)     substance abuse  alcohol use, H/o pancreatitis Reports vomiting last week with occasional episodes of bloating. Denies Reflux today.   Endo/Other  diabetes, Poorly Controlled, Type 2A1c 14.8 5/28  Renal/GU negative Renal ROS  negative genitourinary   Musculoskeletal   Abdominal Normal abdominal exam  (+)   Peds  Hematology  (+) Blood dyscrasia, anemia ,   Anesthesia Other Findings Past Medical History: No date: Diabetes (HCC)  BMI    Body Mass Index: 18.65 kg/m      Reproductive/Obstetrics negative OB ROS                             Anesthesia Physical  Anesthesia Plan  ASA: 3  Anesthesia Plan: General   Post-op Pain Management: Ofirmev IV (intra-op)*   Induction: Intravenous  PONV Risk Score and Plan: 2 and Ondansetron, Dexamethasone and Treatment may vary due to age or medical condition  Airway Management Planned: LMA  Additional Equipment: None  Intra-op Plan:   Post-operative Plan: Extubation in OR  Informed Consent: I have reviewed the patients History and Physical, chart, labs  and discussed the procedure including the risks, benefits and alternatives for the proposed anesthesia with the patient or authorized representative who has indicated his/her understanding and acceptance.     Dental Advisory Given  Plan Discussed with: Anesthesiologist, CRNA and Surgeon  Anesthesia Plan Comments: (Discussed risks of anesthesia with patient, including PONV, sore throat, lip/dental/eye damage. Rare risks discussed as well, such as cardiorespiratory and neurological sequelae, and allergic reactions. Discussed the role of CRNA in patient's perioperative care. Patient understands.)        Anesthesia Quick Evaluation                                  Anesthesia Evaluation  Patient identified by MRN, date of birth, ID band Patient awake    Reviewed: Allergy & Precautions, NPO status , Patient's Chart, lab work & pertinent test results  Airway Mallampati: III  TM Distance: >3 FB Neck ROM: full    Dental  (+) Poor Dentition, Chipped, Missing   Pulmonary neg pulmonary ROS,    Pulmonary exam normal        Cardiovascular Exercise Tolerance: Good  Rhythm:Regular Rate:Tachycardia - Peripheral Edema    Neuro/Psych negative neurological ROS  negative psych ROS   GI/Hepatic (+)     substance abuse  alcohol use, H/o pancreatitis Reports vomiting last week with occasional episodes of bloating. Denies Reflux today.   Endo/Other  diabetes, Poorly Controlled, Type 2A1c 14.8 5/28  Renal/GU negative Renal ROS  negative genitourinary   Musculoskeletal   Abdominal Normal abdominal exam  (+)   Peds  Hematology  (+) Blood dyscrasia, anemia ,   Anesthesia Other Findings Past Medical History: No date: Diabetes (HCC)  BMI    Body Mass Index: 18.65 kg/m      Reproductive/Obstetrics negative OB ROS                            Anesthesia Physical Anesthesia Plan  ASA: 3  Anesthesia Plan: General   Post-op Pain  Management: Minimal or no pain anticipated and Regional block*   Induction: Intravenous  PONV Risk Score and Plan: Propofol infusion and TIVA  Airway Management Planned: Natural Airway and Simple Face Mask  Additional Equipment:   Intra-op Plan:   Post-operative Plan:   Informed Consent: I have reviewed the patients History and Physical, chart, labs and discussed the procedure including the risks, benefits and alternatives for the proposed anesthesia with the patient or authorized representative who has indicated his/her understanding and acceptance.     Dental Advisory Given  Plan Discussed with: Anesthesiologist, CRNA and Surgeon  Anesthesia Plan Comments:        Anesthesia Quick Evaluation

## 2022-01-03 NOTE — Progress Notes (Signed)
History and Physical Interval Note:  01/03/2022 5:03 PM  Cody Cowan  has presented today for surgery, with the diagnosis of osteomyelitis.  The various methods of treatment have been discussed with the patient and family via translation from [place] to [place]. After consideration of risks, benefits and other options for treatment, the patient has consented to   Procedure(s): TRANSMETATARSAL AMPUTATION (Left) as a surgical intervention.  The patient's history has been reviewed, patient examined, no change in status, stable for surgery.  I have reviewed the patient's chart and labs.  Questions were answered to the patient's satisfaction.     [name]

## 2022-01-03 NOTE — Plan of Care (Signed)

## 2022-01-03 NOTE — Transfer of Care (Signed)
Immediate Anesthesia Transfer of Care Note  Patient: Cody Cowan  Procedure(s) Performed: TRANSMETATARSAL AMPUTATION (Left: Foot)  Patient Location: PACU  Anesthesia Type:General  Level of Consciousness: sedated  Airway & Oxygen Therapy: Patient Spontanous Breathing and Patient connected to face mask oxygen  Post-op Assessment: Report given to RN and Post -op Vital signs reviewed and stable  Post vital signs: Reviewed  Last Vitals:  Vitals Value Taken Time  BP    Temp    Pulse    Resp    SpO2      Last Pain:  Vitals:   01/03/22 1138  TempSrc:   PainSc: 0-No pain      Patients Stated Pain Goal: 0 (74/71/59 5396)  Complications: No notable events documented.

## 2022-01-03 NOTE — Progress Notes (Signed)
Hypoglycemic Event  CBG: 69  Treatment: D50 50 mL (25 gm)  Symptoms: Shaky  Follow-up CBG: Time:1453 CBG Result:147  Possible Reasons for Event: Inadequate meal intake. Patient is NPO   Comments/MD notified: Dr. Franchot Erichsen A Genavive Kubicki

## 2022-01-04 ENCOUNTER — Encounter: Payer: Self-pay | Admitting: Podiatry

## 2022-01-04 DIAGNOSIS — F1020 Alcohol dependence, uncomplicated: Secondary | ICD-10-CM

## 2022-01-04 LAB — BASIC METABOLIC PANEL
Anion gap: 9 (ref 5–15)
BUN: 23 mg/dL — ABNORMAL HIGH (ref 6–20)
CO2: 23 mmol/L (ref 22–32)
Calcium: 9 mg/dL (ref 8.9–10.3)
Chloride: 98 mmol/L (ref 98–111)
Creatinine, Ser: 0.73 mg/dL (ref 0.61–1.24)
GFR, Estimated: >60 mL/min (ref >60)
Glucose, Bld: 426 mg/dL — ABNORMAL HIGH (ref 70–99)
Potassium: 4.5 mmol/L (ref 3.5–5.1)
Sodium: 130 mmol/L — ABNORMAL LOW (ref 135–145)

## 2022-01-04 LAB — GLUCOSE, CAPILLARY
Glucose-Capillary: 362 mg/dL — ABNORMAL HIGH (ref 70–99)
Glucose-Capillary: 356 mg/dL — ABNORMAL HIGH (ref 70–99)
Glucose-Capillary: 403 mg/dL — ABNORMAL HIGH (ref 70–99)
Glucose-Capillary: 206 mg/dL — ABNORMAL HIGH (ref 70–99)

## 2022-01-04 MED ORDER — INSULIN GLARGINE-YFGN 100 UNIT/ML ~~LOC~~ SOLN
25.0000 [IU] | Freq: Every day | SUBCUTANEOUS | Status: DC
  Administered 2022-01-05 – 2022-01-06 (×2): 25 [IU] via SUBCUTANEOUS
  Filled 2022-01-04 (×2): qty 0.25

## 2022-01-04 MED ORDER — LIVING WELL WITH DIABETES BOOK
Freq: Once | Status: AC
  Filled 2022-01-04: qty 1

## 2022-01-04 MED ORDER — DOXYCYCLINE HYCLATE 100 MG PO TABS
100.0000 mg | ORAL_TABLET | Freq: Two times a day (BID) | ORAL | Status: DC
  Administered 2022-01-04 – 2022-01-05 (×2): 100 mg via ORAL
  Filled 2022-01-04 (×2): qty 1

## 2022-01-04 MED ORDER — INSULIN GLARGINE-YFGN 100 UNIT/ML ~~LOC~~ SOLN
5.0000 [IU] | Freq: Once | SUBCUTANEOUS | Status: AC
  Administered 2022-01-04: 5 [IU] via SUBCUTANEOUS
  Filled 2022-01-04: qty 0.05

## 2022-01-04 MED ORDER — INSULIN GLARGINE-YFGN 100 UNIT/ML ~~LOC~~ SOLN
20.0000 [IU] | Freq: Every day | SUBCUTANEOUS | Status: DC
  Administered 2022-01-04: 20 [IU] via SUBCUTANEOUS
  Filled 2022-01-04: qty 0.2

## 2022-01-04 MED ORDER — INSULIN ASPART 100 UNIT/ML IJ SOLN
0.0000 [IU] | Freq: Three times a day (TID) | INTRAMUSCULAR | Status: DC
  Administered 2022-01-04: 15 [IU] via SUBCUTANEOUS
  Administered 2022-01-04: 5 [IU] via SUBCUTANEOUS
  Administered 2022-01-05: 8 [IU] via SUBCUTANEOUS
  Administered 2022-01-05 (×2): 15 [IU] via SUBCUTANEOUS
  Administered 2022-01-06: 11 [IU] via SUBCUTANEOUS
  Administered 2022-01-06: 8 [IU] via SUBCUTANEOUS
  Filled 2022-01-04 (×8): qty 1

## 2022-01-04 MED ORDER — INSULIN STARTER KIT- PEN NEEDLES (ENGLISH)
1.0000 | Freq: Once | Status: AC
  Administered 2022-01-04: 1
  Filled 2022-01-04: qty 1

## 2022-01-04 NOTE — Progress Notes (Signed)
PROGRESS NOTE  Cody Cowan    DOB: 1965/07/04, 56 y.o.  TOI:[id number]    Code Status: Full Code   DOA: 01/01/2022   LOS: 3   Brief hospital course  Cody Cowan is a 56 y.o. male with a PMH significant for type 2 diabetes, hyperlipidemia, prior osteomyelitis. Patient has no PCP and unable to afford purchase medications or transportation to obtain medications or to go to PCP appointments which interferes with his chronic treatment adherence and likely directly resulted in needing hospitalization.   They presented from home to the ED on 01/01/2022 with infected left foot ulcer worsening x several days. Systemic symptoms include abdominal pain, nausea.  In the ED, it was found that they had hypertensive at 143/99, with heart rate of 123.  He was afebrile at 98.2.  He was saturating at 98% on room air..  Significant findings included glucose of 549, lactic acidosis of 2.5.  Left foot x-ray obtained that demonstrates acute osteomyelitis.  They were initially treated with IV Abx. Podiatry was consulted for evaluation and management.   Patient was admitted to medicine service for further workup and management of acute osteomyelitis as outlined in detail below.  01/04/22 -stable. Amputation 10/30  Assessment & Plan  Principal Problem:   Acute osteomyelitis ([place]) Active Problems:   Hyperglycemic crisis in diabetes mellitus ([place])   Uncontrolled type 2 diabetes mellitus with hyperglycemia, without long-term current use of insulin ([place])   AKI (acute kidney injury) ([place])   Hypertension   EtOH dependence ([place])   Cellulitis of left foot  Acute osteomyelitis ([place])- MRI this admission showing active osteomyelitis in proximal and distal phalanges of left great toe, 5th phalanx and 5th metatarsal head, and soft tissue involvement. Signs of probable osteomyelitis of the second and third digits as well. Osteomyelitis in the same foot in May 2023 grew Klebsiella pneumoniae, E.  coli, Enterococcus faecalis, and Bacteroides uniformis. patient doing well since surgery. Working with PT this morning. - Podiatry consulted; appreciate their recommendations  - amputation 10/30 - transition broad-spectrum IV antibiotics with Vanco, cefepime, metronidazole to PO post op, per podiatry recommendations - Blood cultures no growth to date - Will need surgical cultures to narrow antibiotics - PT/OT   Hyperglycemic crisis in diabetes mellitus ([place])- A1c this admission 14.4. had hypoglycemic incident yesterday as he was since before midnight up until his procedure at 1700 yesterday. He was asymptomatic and recovered with d50. He has returned to hyperglycemic state with diet placed back again.  - Semglee 20 units daily increased to 25 - SSI, resistant - diabetes education - TOC engaged to assist with medication post dc   AKI (acute kidney injury) ([place])- resolved In the setting of acute infection and severe hyperglycemia. - Repeat BMP in the a.m.  Hyponatremia- Na+ stable. 130 today in relation to severely elevated glucose. Corrects to 135 - BMP am - continue to improve glucose control   Hypertension- initiated losartan in setting of chronic comorbidities and normalized kidney function which has improved - continue losartan. Can consider increasing dose    Access to care barriers-  - TOC engaged for assistance with medications, PCP, and transportation to medical appointments after dc   -CIWA without Ativan  Body mass index is 16.93 kg/m.  VTE ppx: SCDs Start: 01/01/22 1528   Diet:     Diet   Diet Carb Modified Fluid consistency: Thin; Room service appropriate? Yes   Consultants: podiatry Subjective 01/04/22    Pt reports no complaints today.  He states that he is doing well.    Objective   Vitals:   01/03/22 1923 01/03/22 1942 01/03/22 2318 01/04/22 0508  BP: (!) 162/97 (!) 128/90 (!) 157/87 (!) 158/97  Pulse: (!) 104 (!) 102 81 84  Resp: 17 18 18 18   Temp:  98.8 F (37.1 C) 98.3 F (36.8 C) 98.3 F (36.8 C) 97.9 F (36.6 C)  TempSrc:  Oral  Oral  SpO2: 100% 100% 100% 100%  Weight:      Height:        Intake/Output Summary (Last 24 hours) at 01/04/2022 0718 Last data filed at 01/04/2022 0510 Gross per 24 hour  Intake 801.07 ml  Output 1200 ml  Net -398.93 ml    Filed Weights   01/01/22 1356  Weight: 53.5 kg     Physical Exam:  General: awake, alert, NAD HEENT: atraumatic, clear conjunctiva, anicteric sclera, MMM, hearing grossly normal Respiratory: normal respiratory effort. Cardiovascular: quick capillary refill, normal S1/S2, RRR, no JVD, murmurs Gastrointestinal: soft, NT, ND Nervous: A&O x3. no gross focal neurologic deficits, normal speech Extremities: left foot bandaged in clean, dry dressing. Foul odor from foot.  Skin: dry, intact, normal temperature, normal color. No rashes, lesions or ulcers on exposed skin Psychiatry: normal mood, congruent affect  Labs   I have personally reviewed the following labs and imaging studies CBC    Component Value Date/Time   WBC 7.5 01/02/2022 0437   RBC 3.30 (L) 01/02/2022 0437   HGB 10.2 (L) 01/02/2022 0437   HCT 29.5 (L) 01/02/2022 0437   PLT 277 01/02/2022 0437   MCV 89.4 01/02/2022 0437   MCH 30.9 01/02/2022 0437   MCHC 34.6 01/02/2022 0437   RDW 11.7 01/02/2022 0437   LYMPHSABS 1.8 01/02/2022 0437   MONOABS 0.9 01/02/2022 0437   EOSABS 0.1 01/02/2022 0437   BASOSABS 0.0 01/02/2022 0437      Latest Ref Rng & Units 01/04/2022    5:52 AM 01/03/2022    3:55 AM 01/02/2022    4:37 AM  BMP  Glucose 70 - 99 mg/dL 01/04/2022  671  245   BUN 6 - 20 mg/dL 23  19  15    Creatinine 0.61 - 1.24 mg/dL 809   9.83   Sodium 135 - 145 mmol/L 130  132  131   Potassium 3.5 - 5.1 mmol/L 4.5  3.8  3.8   Chloride 98 - 111 mmol/L 98  96  94   CO2 22 - 32 mmol/L 23  27  27    Calcium 8.9 - 10.3 mg/dL 9.0  9.2  9.1     DG MINI C-ARM IMAGE ONLY  Result Date: 01/03/2022 There is no  interpretation for this exam.  This order is for images obtained during a surgical procedure.  Please See "Surgeries" Tab for more information regarding the procedure.    Disposition Plan & Communication  Patient status: Inpatient  Admitted From: Home Planned disposition location: TBD Anticipated discharge date: 10/31 pending podiatry clearance  Family Communication: none     Author: , DO [facility] 01/04/2022, 7:18 AM   Available by Epic secure chat 7AM-7PM. If 7PM-7AM, please contact night-coverage.  [facility] contact information found on 11/31.

## 2022-01-04 NOTE — Inpatient Diabetes Management (Addendum)
Inpatient Diabetes Program Recommendations  AACE/ADA: New Consensus Statement on Inpatient Glycemic Control (2015)  Target Ranges:  Prepandial:   less than 140 mg/dL      Peak postprandial:   less than 180 mg/dL (1-2 hours)      Critically ill patients:  140 - 180 mg/dL   Lab Results  Component Value Date   GLUCAP 362 (H) 01/04/2022   HGBA1C 14.4 (H) 01/01/2022    Review of Glycemic Control  Latest Reference Range & Units 01/03/22 18:28 01/04/22 08:00  Glucose-Capillary 70 - 99 mg/dL 129 (H) 362 (H)  (H): Data is abnormally high  Diabetes history: DM2 Outpatient Diabetes medications: Unsure-will speak with him Current orders for Inpatient glycemic control: Semglee 20 units QD, Novolog 0-15 units TID  Inpatient Diabetes Program Recommendations:   1-Semglee 25 units QD (could administer 5 additional units now as he has already received 20 units this morning)  2-Novolog 0-5 units QHS  3-Will likely need meal coverage as well-will check back on noon CBG  Addendum@11 :01-  Spoke with patient at bedside with in person Spanish interpreter.  He has had DM2 since 2012.  He has not administered insulin since May because he ran out and does not have transportation.  He has had increased thirst, increased urination and weight loss.  He states he wakes up every hour to void.  Reviewed patient's current A1c of 14.4% (average BG of 367 mg/dL).  Explained what a A1c is and what it measures. Also reviewed goal A1c with patient, importance of good glucose control @ home, and blood sugar goals.  He has a glucometer at home.  Asked him to check his blood glucose fasting daily.    TOC consult placed for assistance with medications and PCP and transportation.  He could hopefully get Bus transportation for PCP appointments and medication refills.    He cannot apply for Medicaid as he is undocumented.    He will need medications delivered to bedside prior to DC if possible.   Will continue to follow  while inpatient.  Thank you, Cody Dixon, MSN, [place] Diabetes Coordinator Inpatient Diabetes Program [phone] (team pager from 8a-5p)

## 2022-01-04 NOTE — Plan of Care (Signed)

## 2022-01-04 NOTE — Progress Notes (Addendum)
  Subjective:  Patient ID: Cody Cowan, male    DOB: June 10, 1965,  MRN: [id number]  A 56 y.o. male with history of gangrene/osteomyelitis of the left foot now s/p transmetatarsal amputation closed. He is doing well. No acute complaints. No N/f/c/v/sob/cp  Objective:   Vitals:   01/04/22 0508 01/04/22 0925  BP: (!) 158/97 117/81  Pulse: 84 92  Resp: 18 17  Temp: 97.9 F (36.6 C) 98.2 F (36.8 C)  SpO2: 100% 100%   General AA&O x3. Normal mood and affect.  Vascular Dorsalis pedis and posterior tibial pulses faintly bilat. Brisk capillary refill to all digits. Pedal hair present.  Neurologic Epicritic sensation grossly intact.  Dermatologic No calf pain. Motor/sensory functions intact. [place] clean dry intact  Orthopedic: MMT 5/5 in dorsiflexion, plantarflexion, inversion, and eversion. Normal joint ROM without pain or crepitus.    Assessment & Plan:  Patient was evaluated and treated and all questions answered.  Left foot osteomyelitis s/p transmetatarsal amputation POD#1 -All questions/concerns were discussed with patient -Patient is okay to discharged from podiatric standpoint. -No dressing change till follow up -No further surgical plans -10 days of doxycycline. -f/u with dr. Sherryle Lis in one week -podiatry to sign off   Felipa Furnace, DPM  Accessible via secure chat for questions or concerns.

## 2022-01-04 NOTE — Evaluation (Signed)
Occupational Therapy Evaluation Patient Details Name: Cody Cowan MRN: [id number] DOB: [birth year] Today's Date: 01/04/2022   History of Present Illness Pt admitted for acute osteo on L 5th toe. 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 History of DM, HLD and previous osteo.   Clinical Impression   Patient seen for OT evaluation. Video interpreter utilized. Pt presenting with decreased independence in self care, balance, functional mobility/transfers, and endurance. At baseline, pt is independent with ADLs. Pt's sister lives nearby and assists with transportation and groceries when not working. Pt currently functioning at [place] A for functional transfers, set up-supervision for seated grooming/dressing tasks, and Mod-Max A for clothing management in standing. Pt required VC to maintain NWB LLE during mobility. Pt will benefit from acute OT to increase overall independence in the areas of ADLs and functional mobility in order to safely discharge to next venue of care. At this time OT recommends STR at discharge, however, could improve to home health OT pending progress while in this hospital and caregiver support at home (does not currently have 24/7 assist).     Recommendations for follow up therapy are one component of a multi-disciplinary discharge planning process, led by the attending physician.  Recommendations may be updated based on patient status, additional functional criteria and insurance authorization.   Follow Up Recommendations  Skilled nursing-short term rehab (<3 hours/day)    Assistance Recommended at Discharge Frequent or constant Supervision/Assistance  Patient can return home with the following A lot of help with walking and/or transfers;A lot of help with bathing/dressing/bathroom;Assist for transportation;Help with stairs or ramp for entrance;Assistance with cooking/housework    Functional Status Assessment  Patient has had a recent decline in their  functional status and demonstrates the ability to make significant improvements in function in a reasonable and predictable amount of time.  Equipment Recommendations  Other (comment) (defer to next venue of care)    Recommendations for Other Services       Precautions / Restrictions Precautions Precautions: Fall Restrictions Weight Bearing Restrictions: Yes LLE Weight Bearing: Non weight bearing      Mobility Bed Mobility               General bed mobility comments: NT, pt received/left in recliner    Transfers Overall transfer level: Needs assistance Equipment used: Rolling walker (2 wheels) Transfers: Sit to/from Stand Sit to Stand: Min guard           General transfer comment: VC for hand placement, pt remembered to kick out LLE before standing/sitting to maintain NWB LLE      Balance Overall balance assessment: Needs assistance Sitting-balance support: Feet supported Sitting balance-Leahy Scale: Fair Sitting balance - Comments: Pt with R lateral lean during dynamic sitting LB dressing task requiring Min A to correct Postural control: Right lateral lean Standing balance support: Bilateral upper extremity supported, Single extremity supported Standing balance-Leahy Scale: Poor Standing balance comment: pt attempting to take one UE off of RW in order to assist with hiking briefs, required Min A for dynamic standing balance, pt unsteady 2/2 maintaining NWB on LLE                           ADL either performed or assessed with clinical judgement   ADL Overall ADL's : Needs assistance/impaired     Grooming: Wash/dry face;Set up;Sitting               Lower Body Dressing: Sit to/from stand;Supervision/safety;Maximal assistance;Moderate  assistance Lower Body Dressing Details (indicate cue type and reason): able to thread mesh underwear over B feet in sitting with supervision, Mod-Max A to hike briefs in standing Toilet Transfer: Minimal  assistance;Rolling walker (2 wheels) Toilet Transfer Details (indicate cue type and reason): simulated with STS from [place] and Hygiene: Maximal assistance;Sit to/from stand Toileting - Clothing Manipulation Details (indicate cue type and reason): anticipate             Vision Patient Visual Report: No change from baseline       Perception     Praxis      Pertinent Vitals/Pain Pain Assessment Pain Assessment: No/denies pain     Hand Dominance     Extremity/Trunk Assessment Upper Extremity Assessment Upper Extremity Assessment: Overall WFL for tasks assessed   Lower Extremity Assessment Lower Extremity Assessment: Generalized weakness       Communication Communication Communication: Interpreter utilized   Cognition Arousal/Alertness: Awake/alert Behavior During Therapy: WFL for tasks assessed/performed Overall Cognitive Status: Within Functional Limits for tasks assessed                                       General Comments       Exercises Other Exercises Other Exercises: OT provided education re: role of OT, OT POC, post acute recs, sitting up for all meals, EOB/OOB mobility with assistance, home/fall safety.     Shoulder Instructions      Home Living Family/patient expects to be discharged to:: Private residence Living Arrangements: Alone Available Help at Discharge: Family;Available PRN/intermittently (has sister who lives close but works during the day) Type of Home: House Home Access: Stairs to enter Technical brewer of Steps: 5 Entrance Stairs-Rails: Left;Right (wide) Home Layout: One level     Bathroom Shower/Tub: Teacher, early years/pre: Standard     Home Equipment: Conservation officer, nature (2 wheels)          Prior Functioning/Environment Prior Level of Function : Independent/Modified Independent             Mobility Comments: reports no previous falls ADLs Comments:  Independent with ADLs. Sister delivers groceries and provides transportation when she's not working        OT Problem List: Decreased strength;Decreased range of motion;Decreased activity tolerance;Impaired balance (sitting and/or standing);Decreased knowledge of use of DME or AE;Decreased knowledge of precautions      OT Treatment/Interventions: Self-care/ADL training;Therapeutic exercise;Patient/family education;Balance training;Therapeutic activities;DME and/or AE instruction    OT Goals(Current goals can be found in the care plan section) Acute Rehab OT Goals Patient Stated Goal: to go home OT Goal Formulation: With patient Time For Goal Achievement: 01/18/22 Potential to Achieve Goals: Good  OT Frequency: Min 2X/week    Co-evaluation              AM-PAC OT "6 Clicks" Daily Activity     Outcome Measure Help from another person eating meals?: A Little (set up) Help from another person taking care of personal grooming?: A Little Help from another person toileting, which includes using toliet, bedpan, or urinal?: A Lot Help from another person bathing (including washing, rinsing, drying)?: A Lot Help from another person to put on and taking off regular upper body clothing?: A Little Help from another person to put on and taking off regular lower body clothing?: A Lot 6 Click Score: 15   End of Session Equipment Utilized  During Treatment: Gait belt;Rolling walker (2 wheels) Nurse Communication: Mobility status  Activity Tolerance: Patient tolerated treatment well Patient left: in chair;with call bell/phone within reach;with chair alarm set  OT Visit Diagnosis: Unsteadiness on feet (R26.81);Muscle weakness (generalized) (M62.81)                Time: [id number] OT Time Calculation (min): 21 min Charges:  OT General Charges $OT Visit: 1 Visit OT Evaluation $OT Eval Low Complexity: 1 Low  [facility] MS, OTR/L ascom [phone]  01/04/22, 1:52 PM

## 2022-01-04 NOTE — Evaluation (Addendum)
Physical Therapy Evaluation Patient Details Name: Cody Cowan MRN: [id number] DOB: 01-20-1966 Today's Date: 01/04/2022  History of Present Illness  Pt admitted for acute osteo on L 5th toe. 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 History of DM, HLD and previous osteo.  Clinical Impression  Pt is a pleasant 56 year old male who was admitted for acute osteo and is s/p transmet amputation on 01/03/22. Pt performs bed mobility with mod I and transfers with min assist using RW and NWB LE. Spanish interpreter Liliana used during session [phone]. Pt demonstrates deficits with strength/mobility/endurance. Unable to perform true ambulation this date due to fatigue. Would benefit from skilled PT to address above deficits and promote optimal return to PLOF; recommend transition to STR upon discharge from acute hospitalization. Would be able to dc home if 24/7 assist provided and WC, otherwise continue to recommend SNF.       Recommendations for follow up therapy are one component of a multi-disciplinary discharge planning process, led by the attending physician.  Recommendations may be updated based on patient status, additional functional criteria and insurance authorization.  Follow Up Recommendations Skilled nursing-short term rehab (<3 hours/day) Can patient physically be transported by private vehicle: No    Assistance Recommended at Discharge Intermittent Supervision/Assistance  Patient can return home with the following  A lot of help with walking and/or transfers;A little help with bathing/dressing/bathroom;Help with stairs or ramp for entrance    Equipment Recommendations Wheelchair (measurements PT)  Recommendations for Other Services       Functional Status Assessment Patient has had a recent decline in their functional status and demonstrates the ability to make significant improvements in function in a reasonable and predictable amount of time.     Precautions /  Restrictions Precautions Precautions: Fall Restrictions Weight Bearing Restrictions: Yes LLE Weight Bearing: Non weight bearing      Mobility  Bed Mobility Overal bed mobility: Modified Independent             General bed mobility comments: safe technique with upright posture    Transfers Overall transfer level: Needs assistance Equipment used: Rolling walker (2 wheels) Transfers: Bed to chair/wheelchair/BSC     Step pivot transfers: Min assist       General transfer comment: needed min assist for transfer over to recliner with cues for keeping NWB on L LE. Pt has increased pounding on R LE and fatigues quickly with mobility efforts.    Ambulation/Gait               General Gait Details: unable to progress to true ambulation this date due to [facility] status  Stairs            Wheelchair Mobility    Modified Rankin (Stroke Patients Only)       Balance Overall balance assessment: Needs assistance Sitting-balance support: Feet supported Sitting balance-Leahy Scale: Good     Standing balance support: Bilateral upper extremity supported Standing balance-Leahy Scale: Poor                               Pertinent Vitals/Pain Pain Assessment Pain Assessment: No/denies pain    Home Living Family/patient expects to be discharged to:: Private residence Living Arrangements: Alone Available Help at Discharge: Family;Other (Comment) (has sister who lives close but works) Type of Home: House Home Access: Stairs to enter Entrance Stairs-Rails: Left;Right (wide) Entrance Stairs-Number of Steps: 5   Home Layout: One level  Home Equipment: Conservation officer, nature (2 wheels)      Prior Function Prior Level of Function : Independent/Modified Independent             Mobility Comments: reports no previous falls       Hand Dominance        Extremity/Trunk Assessment   Upper Extremity Assessment Upper Extremity Assessment: Overall WFL for  tasks assessed    Lower Extremity Assessment Lower Extremity Assessment: Generalized weakness (R LE grossly 4/5; L LE grossly 3+/5)       Communication   Communication: Interpreter utilized  Cognition Arousal/Alertness: Awake/alert Behavior During Therapy: WFL for tasks assessed/performed Overall Cognitive Status: Within Functional Limits for tasks assessed                                          General Comments      Exercises     Assessment/Plan    PT Assessment Patient needs continued PT services  PT Problem List Decreased strength;Decreased balance;Decreased mobility;Decreased knowledge of precautions       PT Treatment Interventions Gait training;DME instruction;Therapeutic activities;Therapeutic exercise;Stair training    PT Goals (Current goals can be found in the Care Plan section)  Acute Rehab PT Goals Patient Stated Goal: to go home PT Goal Formulation: With patient Time For Goal Achievement: 01/18/22 Potential to Achieve Goals: Good    Frequency Min 2X/week     Co-evaluation               AM-PAC PT "6 Clicks" Mobility  Outcome Measure Help needed turning from your back to your side while in a flat bed without using bedrails?: None Help needed moving from lying on your back to sitting on the side of a flat bed without using bedrails?: None Help needed moving to and from a bed to a chair (including a wheelchair)?: A Little Help needed standing up from a chair using your arms (e.g., wheelchair or bedside chair)?: A Little Help needed to walk in hospital room?: A Lot Help needed climbing 3-5 steps with a railing? : Total 6 Click Score: 17    End of Session Equipment Utilized During Treatment: Gait belt Activity Tolerance: Patient tolerated treatment well Patient left: in chair;with chair alarm set Nurse Communication: Mobility status PT Visit Diagnosis: Unsteadiness on feet (R26.81);Muscle weakness (generalized)  (M62.81);Difficulty in walking, not elsewhere classified (R26.2)    Time: 3500-9381 PT Time Calculation (min) (ACUTE ONLY): 17 min   Charges:   PT Evaluation $PT Eval Low Complexity: 1 Low          Greggory Stallion, PT, DPT, GCS [phone]   Millette Halberstam 01/04/2022, 11:35 AM

## 2022-01-04 NOTE — Op Note (Signed)
Patient Name: Cody Cowan DOB: Apr 08, 1965  MRN: [id number]   Date of Service: 01/01/2022 - 01/03/2022  Surgeon: Dr. Lanae Crumbly, DPM Assistants: None Pre-operative Diagnosis:  Gangrene of left foot Osteomyelitis left foot Post-operative Diagnosis:  Gangrene of left foot Osteomyelitis left foot Procedures:  1) transmetatarsal amputation left foot Pathology/Specimens: ID Type Source Tests Collected by Time Destination  1 : Left Forefoot Tissue PATH Other SURGICAL PATHOLOGY [name], DPM 01/03/2022 1630   A : Left Foot Deep Tissue (Culture) Tissue PATH Other AEROBIC/ANAEROBIC CULTURE W GRAM STAIN (SURGICAL/DEEP WOUND) [name], DPM 01/03/2022 1619    Anesthesia: General anesthesia with local ankle block Hemostasis: 16 minutes calf tourniquet at 250 mmHg Estimated Blood Loss: Less than 10 cc Materials: * No implants in log * Medications: 10 cc each 2% lidocaine and 0.5% Marcaine plain in ankle block Complications: No complication noted  Indications for Procedure:  This is a 56 y.o. male with a history of uncontrolled type 2 diabetes and previous fourth toe osteomyelitis with recurrent ulceration and osteomyelitis of the digits.  Transmetatarsal amputation was recommended as his last option of limb salvage.   Procedure in Detail: Patient was identified in pre-operative holding area. Formal consent was signed and the left lower extremity was marked. Patient was brought back to the operating room. Anesthesia was induced. The extremity was prepped and draped in the usual sterile fashion. Timeout was taken to confirm patient name, laterality, and procedure prior to incision.   Attention was then directed to the left lower extremity where a fishmouth style incision was created along the metatarsal parabola dorsally and plantarly proximal to the necrotic tissues.  More soft tissue was necessary to be excised medially.  This was carried directly to the level of  the bone and elevators were used to elevate the soft tissues from the metatarsals.  A sagittal saw was used to create a transmetatarsal amputation in standard orientation and parabola.  Due to the need for more soft tissue resection proximally on the medial side of the first metatarsal required further proximal resection then the others.  Once this was complete the forefoot was excised from the remainder of the foot and a deep tissue culture of the necrotic area was taken and sent to microbiology.  The bone and soft tissues of the forefoot were sent for pathology.  The wound was thoroughly irrigated with saline.  No further necrosis was noted at this level and appeared healthy and had good perfusion.  At this point the tourniquet was deflated and hemostasis was achieved.  The wound was closed in layers using 2-0 PDS, 3-0 Monocryl, 2-0 nylon, skin staples.  The range of motion of the ankle joint was then assessed and he did not appear to have significant equinus to necessitate a tendo Achilles lengthening.  Sterile dressings were then applied with Xeroform, DSD, ABD pad, well-padded below-knee splint with cast padding and fiberglass posterior splint.    Disposition: Following a period of post-operative monitoring, patient will be transferred to the floor.  He will be stable for discharge.  He should be nonweightbearing on the left lower extremity.  I will have my office arrange his follow-up.

## 2022-01-05 LAB — BASIC METABOLIC PANEL
Anion gap: 7 (ref 5–15)
BUN: 20 mg/dL (ref 6–20)
CO2: 24 mmol/L (ref 22–32)
Calcium: 8.9 mg/dL (ref 8.9–10.3)
Chloride: 99 mmol/L (ref 98–111)
Creatinine, Ser: 0.77 mg/dL (ref 0.61–1.24)
GFR, Estimated: >60 mL/min (ref >60)
Glucose, Bld: 463 mg/dL — ABNORMAL HIGH (ref 70–99)
Potassium: 3.9 mmol/L (ref 3.5–5.1)
Sodium: 130 mmol/L — ABNORMAL LOW (ref 135–145)

## 2022-01-05 LAB — GLUCOSE, CAPILLARY
Glucose-Capillary: 372 mg/dL — ABNORMAL HIGH (ref 70–99)
Glucose-Capillary: 374 mg/dL — ABNORMAL HIGH (ref 70–99)
Glucose-Capillary: 293 mg/dL — ABNORMAL HIGH (ref 70–99)

## 2022-01-05 MED ORDER — ENOXAPARIN SODIUM 40 MG/0.4ML IJ SOSY
40.0000 mg | PREFILLED_SYRINGE | INTRAMUSCULAR | Status: DC
  Administered 2022-01-05: 40 mg via SUBCUTANEOUS
  Filled 2022-01-05: qty 0.4

## 2022-01-05 MED ORDER — CEFDINIR 300 MG PO CAPS: 300.0000 mg | ORAL_CAPSULE | Freq: Two times a day (BID) | ORAL | Status: DC

## 2022-01-05 MED ORDER — CIPROFLOXACIN HCL 500 MG PO TABS: 500.0000 mg | ORAL_TABLET | Freq: Two times a day (BID) | ORAL | Status: DC

## 2022-01-05 MED ORDER — AMOXICILLIN-POT CLAVULANATE 875-125 MG PO TABS
1.0000 | ORAL_TABLET | Freq: Two times a day (BID) | ORAL | Status: DC
  Administered 2022-01-05: 1 via ORAL
  Filled 2022-01-05: qty 1

## 2022-01-05 MED ORDER — AMOXICILLIN-POT CLAVULANATE 875-125 MG PO TABS
1.0000 | ORAL_TABLET | Freq: Two times a day (BID) | ORAL | Status: DC
  Administered 2022-01-05 – 2022-01-06 (×2): 1 via ORAL
  Filled 2022-01-05 (×2): qty 1

## 2022-01-05 NOTE — Progress Notes (Signed)
PROGRESS NOTE    Cody Cowan   VKP:[id number] DOB: [birth year]  DOA: 01/01/2022 Date of Service: 01/05/22 PCP: Patient, No Pcp Per     Brief Narrative / Hospital Course:  Cody Cowan is a 56 y.o. male with a PMH significant for type 2 diabetes, hyperlipidemia, prior osteomyelitis. Patient has no PCP and unable to afford purchase medications or transportation to obtain medications or to go to PCP appointments which interferes with his chronic treatment adherence and likely directly resulted in needing hospitalization. They presented from home to the ED on 01/01/2022 with infected left foot ulcer worsening x several days. Systemic symptoms included abdominal pain, nausea. 10/28-10/29: In the ED, it was found that they had hypertensive at 143/99, with heart rate of 123.  He was afebrile. Significant findings included glucose of 549, lactic acidosis of 2.5.  Left foot x-ray obtained that demonstrates acute osteomyelitis. Started IV Abx. Podiatry was consulted 10/30: L transmetatarsal amputation performed  10/31: stable, PT/OT rec SNF. Pend cultures 11/01: Cultures (+)proteus pan-sensitive. Stable but no insurance = unable to arrange SNF, pt prefers to go home w/ [facility] so this is in process to arrange HH/DME through charity sources   Consultants:  podiatry  Procedures: 01/03/22: L transmetatarsal amputation       ASSESSMENT & PLAN:   Principal Problem:   Acute osteomyelitis ([place]) Active Problems:   Hyperglycemic crisis in diabetes mellitus ([place])   Uncontrolled type 2 diabetes mellitus with hyperglycemia, without long-term current use of insulin ([place])   AKI (acute kidney injury) ([place])   Hypertension   EtOH dependence ([place])   Cellulitis of left foot  Acute osteomyelitis ([place]) proximal and distal phalanges of left great toe, 5th phalanx and 5th metatarsal head, and soft tissue involvement. Signs of probable osteomyelitis of the second and third digits as  well S/p amputation 10/30 Can d/c on 10 days PO abx per culture data --> cefdinir should cover the proteus  F/U 1 week in clinic w/ Dr Sherryle Lis - per notes, office will call for f/u  PT/OT rec SNF but barriers to access this w/ no insurance, will arrange [facility] and DME   Hyperglycemic crisis in diabetes mellitus ([place])-  A1c this admission 14.4. Semglee 20 units daily increased to 25 SSI, resistant diabetes education TOC engaged to assist with medication post dc   AKI (acute kidney injury) ([place]) - resolved Repeat BMP as needed   Pseudo-Hyponatremia d/t hyperglycemia - persists BMP am continue to improve glucose control   Hypertension initiated losartan in setting of chronic comorbidities and normalized kidney function which has improved   Access to care barriers-  TOC engaged for assistance with medications, PCP, and transportation to medical appointments after dc     DVT prophylaxis: lovenox Pertinent IV fluids/nutrition: no conintuous IV fluids, diabetic diet  Central lines / invasive devices: none  Code Status: FULL CODE Family Communication: none at this time  Disposition: inpatient TOC needs: DME, HH Barriers to discharge / significant pending items: DME/HH in place, anticipate d/c home tomorrow              Subjective:  Used [place] interpreter. Patient reports pain is controlled, no problems today. Confirms prefers to go home if possible. Will need help w/ DME, Meds.        Objective:  Vitals:   01/04/22 1635 01/04/22 2045 01/04/22 2200 01/05/22 0755  BP: 112/70 116/76  115/73  Pulse: 95 95  85  Resp: _0 Temp: 98.2  F (36.8 C) 100.3 F (37.9 C) 98.8 F (37.1 C) 98.3 F (36.8 C)  TempSrc:   Oral   SpO2: 100% 98%  100%  Weight:      Height:        Intake/Output Summary (Last 24 hours) at 01/05/2022 1518 Last data filed at 01/05/2022 1429 Gross per 24 hour  Intake 728 ml  Output 4300 ml  Net -3572 ml   Filed Weights   01/01/22  1356  Weight: 53.5 kg    Examination:  Constitutional:  VS as above General Appearance: alert, well-developed, well-nourished, NAD Respiratory: Normal respiratory effort No wheeze No rhonchi No rales Cardiovascular: S1/S2 normal No murmur No rub/gallop auscultated No lower extremity edema Gastrointestinal: No tenderness Musculoskeletal:  Symmetrical movement in all extremities Neurological: No cranial nerve deficit on limited exam Alert Psychiatric: Normal judgment/insight Normal mood and affect       Scheduled Medications:   atorvastatin  40 mg Oral Daily   cefdinir  300 mg Oral Q12H   enoxaparin (LOVENOX) injection  40 mg Subcutaneous K55V   folic acid  1 mg Oral Daily   insulin aspart  0-15 Units Subcutaneous TID WC   insulin glargine-yfgn  25 Units Subcutaneous Daily   insulin starter kit- pen needles  1 kit Other Once   losartan  100 mg Oral Daily   multivitamin with minerals  1 tablet Oral Daily   sodium chloride flush  3 mL Intravenous Q12H   thiamine  100 mg Oral Daily    Continuous Infusions:   PRN Medications:  acetaminophen **OR** acetaminophen, HYDROmorphone (DILAUDID) injection, ondansetron **OR** ondansetron (ZOFRAN) IV, polyethylene glycol  Antimicrobials:  Anti-infectives (From admission, onward)    Start     Dose/Rate Route Frequency Ordered Stop   01/05/22 2200  cefdinir (OMNICEF) capsule 300 mg        300 mg Oral Every 12 hours 01/05/22 1516     01/05/22 2000  ciprofloxacin (CIPRO) tablet 500 mg  Status:  Discontinued        500 mg Oral 2 times daily 01/05/22 1514 01/05/22 1516   01/05/22 1330  amoxicillin-clavulanate (AUGMENTIN) 875-125 MG per tablet 1 tablet  Status:  Discontinued        1 tablet Oral Every 12 hours 01/05/22 1231 01/05/22 1516   01/04/22 2200  doxycycline (VIBRA-TABS) tablet 100 mg  Status:  Discontinued        100 mg Oral Every 12 hours 01/04/22 1139 01/05/22 1514   01/03/22 1712  vancomycin (VANCOCIN) 1-5  GM/200ML-% IVPB       Note to Pharmacy: Kenyon Ana B: cabinet override      01/03/22 1712 01/04/22 0529   01/02/22 1800  vancomycin (VANCOREADY) IVPB 750 mg/150 mL  Status:  Discontinued        750 mg 150 mL/hr over 60 Minutes Intravenous Every 12 hours 01/02/22 1058 01/04/22 1139   01/02/22 0400  vancomycin (VANCOREADY) IVPB 500 mg/100 mL  Status:  Discontinued        500 mg 100 mL/hr over 60 Minutes Intravenous Every 12 hours 01/01/22 1538 01/02/22 1058   01/01/22 2200  metroNIDAZOLE (FLAGYL) IVPB 500 mg  Status:  Discontinued        500 mg 100 mL/hr over 60 Minutes Intravenous Every 12 hours 01/01/22 1532 01/04/22 1139   01/01/22 1445  vancomycin (VANCOREADY) IVPB 1250 mg/250 mL        1,250 mg 166.7 mL/hr over 90 Minutes Intravenous  Once 01/01/22 1412 01/01/22  1702   01/01/22 1415  ceFEPIme (MAXIPIME) 2 g in sodium chloride 0.9 % 100 mL IVPB  Status:  Discontinued        2 g 200 mL/hr over 30 Minutes Intravenous Every 8 hours 01/01/22 1412 01/04/22 1139   01/01/22 1345  metroNIDAZOLE (FLAGYL) tablet 500 mg  Status:  Discontinued        500 mg Oral Every 12 hours 01/01/22 1333 01/01/22 1532       Data Reviewed: I have personally reviewed following labs and imaging studies  CBC: Recent Labs  Lab 01/01/22 1225 01/02/22 0437  WBC 8.2 7.5  NEUTROABS 5.8 4.5  HGB 12.6* 10.2*  HCT 36.5* 29.5*  MCV 89.2 89.4  PLT 388 056   Basic Metabolic Panel: Recent Labs  Lab 01/01/22 1225 01/02/22 0437 01/03/22 0355 01/04/22 0552 01/05/22 0504  NA 130* 131* 132* 130* 130*  K 4.5 3.8 3.8 4.5 3.9  CL 88* 94* 96* 98 99  CO2 _0 GLUCOSE 549* 299* 310* 426* 463*  BUN _1 23* 20  CREATININE 1.01 0.67 0.70 0.73 0.77  CALCIUM 9.9 9.1 9.2 9.0 8.9   GFR: Estimated Creatinine Clearance: 78.9 mL/min (by C-G formula based on SCr of 0.77 mg/dL). Liver Function Tests: Recent Labs  Lab 01/01/22 1225 01/02/22 0437  AST 29 23  ALT 23 16  ALKPHOS 187* 144*  BILITOT  1.1 0.8  PROT 9.3* 6.8  ALBUMIN 3.9 2.8*   No results for input(s): "LIPASE", "AMYLASE" in the last 168 hours. No results for input(s): "AMMONIA" in the last 168 hours. Coagulation Profile: Recent Labs  Lab 01/01/22 1225  INR 1.1   Cardiac Enzymes: No results for input(s): "CKTOTAL", "CKMB", "CKMBINDEX", "TROPONINI" in the last 168 hours. BNP (last 3 results) No results for input(s): "PROBNP" in the last 8760 hours. HbA1C: No results for input(s): "HGBA1C" in the last 72 hours. CBG: Recent Labs  Lab 01/04/22 1140 01/04/22 1143 01/04/22 1724 01/05/22 0812 01/05/22 1216  GLUCAP 403* 356* 206* 372* 374*   Lipid Profile: No results for input(s): "CHOL", "HDL", "LDLCALC", "TRIG", "CHOLHDL", "LDLDIRECT" in the last 72 hours. Thyroid Function Tests: No results for input(s): "TSH", "T4TOTAL", "FREET4", "T3FREE", "THYROIDAB" in the last 72 hours. Anemia Panel: No results for input(s): "VITAMINB12", "FOLATE", "FERRITIN", "TIBC", "IRON", "RETICCTPCT" in the last 72 hours. Urine analysis:    Component Value Date/Time   COLORURINE STRAW (A) 01/01/2022 1700   APPEARANCEUR CLEAR (A) 01/01/2022 1700   LABSPEC 1.014 01/01/2022 1700   PHURINE 6.0 01/01/2022 1700   GLUCOSEU >=500 (A) 01/01/2022 1700   HGBUR LARGE (A) 01/01/2022 1700   BILIRUBINUR NEGATIVE 01/01/2022 1700   KETONESUR 20 (A) 01/01/2022 1700   PROTEINUR NEGATIVE 01/01/2022 1700   NITRITE NEGATIVE 01/01/2022 1700   LEUKOCYTESUR TRACE (A) 01/01/2022 1700   Sepsis Labs: _2 (procalcitonin:4,lacticidven:4)  Recent Results (from the past 240 hour(s))  Culture, blood (Routine x 2)     Status: None (Preliminary result)   Collection Time: 01/01/22 12:25 PM   Specimen: BLOOD  Result Value Ref Range Status   Specimen Description BLOOD RIGHT FOREARM  Final   Special Requests   Final    BOTTLES DRAWN AEROBIC AND ANAEROBIC Blood Culture adequate volume   Culture   Final    NO GROWTH 4 DAYS Performed at [facility], [address] [place], [place] [address]    Report Status PENDING  Incomplete  Culture, blood (Routine x 2)  Status: None (Preliminary result)   Collection Time: 01/01/22 12:29 PM   Specimen: BLOOD  Result Value Ref Range Status   Specimen Description BLOOD LEFT ARM  Final   Special Requests   Final    BOTTLES DRAWN AEROBIC AND ANAEROBIC Blood Culture adequate volume   Culture   Final    NO GROWTH 4 DAYS Performed at [facility], [address], [place], [place] [address]    Report Status PENDING  Incomplete  Aerobic/Anaerobic Culture w Gram Stain (surgical/deep wound)     Status: None (Preliminary result)   Collection Time: 01/03/22  5:51 PM   Specimen: PATH Other; Tissue  Result Value Ref Range Status   Specimen Description   Final    WOUND Performed at [facility], [address] [place], [place] [address]    Special Requests   Final    LEFT FOOT DEEP TISSUE Performed at [facility], [address] [place], [place] [address]    Gram Stain   Final    RARE WBC PRESENT,BOTH PMN AND MONONUCLEAR ABUNDANT GRAM POSITIVE RODS MODERATE GRAM POSITIVE COCCI MODERATE GRAM NEGATIVE RODS    Culture   Final    FEW PROTEUS MIRABILIS CULTURE REINCUBATED FOR BETTER GROWTH HOLDING FOR POSSIBLE ANAEROBE Performed at [facility], [place] [address], [place], [place] [address]    Report Status PENDING  Incomplete   Organism ID, Bacteria PROTEUS MIRABILIS  Final      Susceptibility   Proteus mirabilis - MIC*    AMPICILLIN <=2 SENSITIVE Sensitive     CEFAZOLIN 8 SENSITIVE Sensitive     CEFEPIME <=0.12 SENSITIVE Sensitive     CEFTAZIDIME <=1 SENSITIVE Sensitive     CEFTRIAXONE <=0.25 SENSITIVE Sensitive     CIPROFLOXACIN <=0.25 SENSITIVE Sensitive     GENTAMICIN <=1 SENSITIVE Sensitive     IMIPENEM 2 SENSITIVE Sensitive     TRIMETH/SULFA <=20 SENSITIVE Sensitive     AMPICILLIN/SULBACTAM <=2 SENSITIVE Sensitive     PIP/TAZO <=4  SENSITIVE Sensitive     * FEW PROTEUS MIRABILIS         Radiology Studies: MR FOOT LEFT W WO CONTRAST  Result Date: 01/02/2022 CLINICAL DATA:  Ongoing left foot ulcers, clinical concern for osteomyelitis EXAM: MRI OF THE LEFT FOREFOOT WITHOUT AND WITH CONTRAST TECHNIQUE: Multiplanar, multisequence MR imaging of the left forefoot was performed both before and after administration of intravenous contrast. CONTRAST:  37m GADAVIST GADOBUTROL 1 MMOL/ML IV SOLN COMPARISON:  Radiographs 01/01/2022 FINDINGS: Despite efforts by the technologist and patient, prominent motion artifact is present on today's exam and could not be eliminated. This reduces exam sensitivity and specificity. Bones/Joint/Cartilage Prior fourth toe amputation at the level of the MTP joint. There is abnormal edema and enhancement in the head of the fifth metatarsal and in the proximal phalanx of the fifth toe compatible with active osteomyelitis. Severe thinning of overlying cutaneous and subcutaneous structures potentially with lateral drainage as suggested on image 16 series 16. Associated substantial marrow enhancement. Active osteomyelitis in the proximal and distal phalanges of the great toe with accentuated T2 signal and associated enhancement. Extensive edema and some thinning in the soft tissues of the lateral great toe along the lateral margin of involvement near the interphalangeal joint. There is abnormal edema signal and osseous enhancement in the proximal phalanges of the second and third toes compatible with osteomyelitis. Ligaments The Lisfranc ligament appears intact. Muscles and Tendons Regional muscular atrophy, cannot exclude mild neurogenic edema. No  intramuscular abscess observed. Soft tissues Widespread subcutaneous edema and enhancement in the forefoot suggesting cellulitis. Possible sinus track extending from the fifth MTP joint laterally to the cutaneous surface. Small amount gas loculations in the plantar soft  tissues at the great toe. IMPRESSION: 1. Active osteomyelitis of the fifth metatarsal head, proximal phalanx of the fifth toe, proximal and distal phalanges of the great toe, and proximal phalanges of the second and third toes. 2. Widespread subcutaneous edema and enhancement in the forefoot suggesting cellulitis. Possible sinus track extending from the fifth MTP joint to the cutaneous surface laterally. 3. Small amount of gas loculations in the plantar soft tissues at the great toe. 4. Regional muscular atrophy, cannot exclude mild neurogenic edema. 5. 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 Electronically Signed   By: Van Clines M.D.   On: 01/02/2022 09:16   DG Foot Complete Left  Result Date: 01/01/2022 CLINICAL DATA:  Foot infection for 1 week.  History of diabetes. EXAM: LEFT FOOT - COMPLETE 3+ VIEW COMPARISON:  08/03/2021. FINDINGS: Soft tissue ulceration and air lies lateral to the fifth metatarsophalangeal joint. There is resorption along the lateral base of the fifth toe proximal phalanx and the adjacent lateral fifth metatarsal head consistent with osteomyelitis. There is additional soft tissue air along the proximal great toe, most evident laterally just proximal to the IP joint. There is more subtle cortical resorption along the lateral distal margin of the proximal phalanx, also consistent with osteomyelitis. No other convincing osteomyelitis.  No fracture. Previous amputation of the fourth toe. Joints are normally aligned. Small plantar and dorsal calcaneal spurs. IMPRESSION: 1. Osteomyelitis of the lateral base of the fifth toe proximal phalanx, lateral aspect of the fifth metatarsal head and more subtly along the lateral margin of the great toe proximal phalanx distally near the IP joint. Electronically Signed   By: Lajean Manes M.D.   On: 01/01/2022 13:54   DG Chest 2 View  Result Date: 01/01/2022 CLINICAL DATA:  Foot infection for a week.  Cough and chest pain.  EXAM: CHEST - 2 VIEW COMPARISON:  01/14/2020. FINDINGS: Normal heart, mediastinum and hila. Clear lungs.  No pleural effusion or pneumothorax. Skeletal structures are intact. IMPRESSION: No active cardiopulmonary disease. Electronically Signed   By: Lajean Manes M.D.   On: 01/01/2022 13:01            LOS: 4 days      Emeterio Reeve, DO [facility] 01/05/2022, 3:18 PM   Staff may message me via secure chat in [place]  but this may not receive immediate response,  please page for urgent matters!  If 7PM-7AM, please contact night-coverage [email or web address]  Dictation software was used to generate the above note. Typos may occur and escape review, as with typed/written notes. Please contact Dr Sheppard Coil directly for clarity if needed.

## 2022-01-05 NOTE — Progress Notes (Addendum)
Physical Therapy Treatment Patient Details Name: Cody Cowan MRN: [id number] DOB: 04-Oct-1965 Today's Date: 01/05/2022   History of Present Illness Pt admitted for acute osteo on L 5th toe. 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 History of DM, HLD and previous osteo.    PT Comments    Pt is making gradual progress towards goals, remains limited due to L NWB. Pt with poor recall thinking he was allowed 30% WBing on L LE. Educated on correct WB precautions. Spanish interpter used [place] [phone]. Supine/seated there-ex performed on B LE. Able to ambulate short distance to recliner, however fatigues quickly, unable to further mobilize. Rates pain at 4/10. Will continue to progress. Pt would be able to progress home if he had WC with elevating L leg rest and increased assist at home. Would need EMS transfer.  Recommendations for follow up therapy are one component of a multi-disciplinary discharge planning process, led by the attending physician.  Recommendations may be updated based on patient status, additional functional criteria and insurance authorization.  Follow Up Recommendations  Skilled nursing-short term rehab (<3 hours/day) Can patient physically be transported by private vehicle: No   Assistance Recommended at Discharge Intermittent Supervision/Assistance  Patient can return home with the following A lot of help with walking and/or transfers;A little help with bathing/dressing/bathroom;Help with stairs or ramp for entrance   Equipment Recommendations  Wheelchair (measurements PT)    Recommendations for Other Services       Precautions / Restrictions Precautions Precautions: Fall Restrictions Weight Bearing Restrictions: Yes LLE Weight Bearing: Non weight bearing     Mobility  Bed Mobility Overal bed mobility: Modified Independent             General bed mobility comments: safe technique, no physical assistance required    Transfers Overall  transfer level: Needs assistance Equipment used: Rolling walker (2 wheels) Transfers: Sit to/from Stand Sit to Stand: Min assist           General transfer comment: needs VC for hand position for safe lift off. Once standing, able to maintain correct WBing with minimal cues    Ambulation/Gait Ambulation/Gait assistance: Min assist Gait Distance (Feet): 5 Feet Assistive device: Rolling walker (2 wheels) Gait Pattern/deviations: Step-to pattern       General Gait Details: ambulated by hopping a few feet to chair. Cues for control landing on R LE. Unsteady needing min assist from therapist. Fatigues very quickly, unable to further porgress   Stairs             Wheelchair Mobility    Modified Rankin (Stroke Patients Only)       Balance Overall balance assessment: Needs assistance Sitting-balance support: Feet supported Sitting balance-Leahy Scale: Fair     Standing balance support: Bilateral upper extremity supported, Single extremity supported Standing balance-Leahy Scale: Poor                              Cognition Arousal/Alertness: Awake/alert Behavior During Therapy: WFL for tasks assessed/performed Overall Cognitive Status: Within Functional Limits for tasks assessed                                          Exercises Other Exercises Other Exercises: Pt able to perform supine/seated ther-ex including B LE quad sets, SLRs, hip abd/add, and LAQ. 10 reps with supervision and safe  technique    General Comments        Pertinent Vitals/Pain Pain Assessment Pain Assessment: 0-10 Pain Score: 4  Pain Location: L foot Pain Descriptors / Indicators: Operative site guarding Pain Intervention(s): Limited activity within patient's tolerance, Repositioned, Monitored during session    Home Living                          Prior Function            PT Goals (current goals can now be found in the care plan section)  Acute Rehab PT Goals Patient Stated Goal: to go home PT Goal Formulation: With patient Time For Goal Achievement: 01/18/22 Potential to Achieve Goals: Good Progress towards PT goals: Progressing toward goals    Frequency    Min 2X/week      PT Plan Current plan remains appropriate    Co-evaluation              AM-PAC PT "6 Clicks" Mobility   Outcome Measure  Help needed turning from your back to your side while in a flat bed without using bedrails?: None Help needed moving from lying on your back to sitting on the side of a flat bed without using bedrails?: None Help needed moving to and from a bed to a chair (including a wheelchair)?: A Little Help needed standing up from a chair using your arms (e.g., wheelchair or bedside chair)?: A Little Help needed to walk in hospital room?: A Lot Help needed climbing 3-5 steps with a railing? : Total 6 Click Score: 17    End of Session Equipment Utilized During Treatment: Gait belt Activity Tolerance: Patient tolerated treatment well Patient left: in chair (chair alarm missing batteries) Nurse Communication: Mobility status PT Visit Diagnosis: Unsteadiness on feet (R26.81);Muscle weakness (generalized) (M62.81);Difficulty in walking, not elsewhere classified (R26.2)     Time: [id number] PT Time Calculation (min) (ACUTE ONLY): 23 min  Charges:  $Gait Training: 8-22 mins $Therapeutic Exercise: 8-22 mins                     Greggory Stallion, PT, DPT, GCS [phone]    Angi Goodell 01/05/2022, 11:19 AM

## 2022-01-05 NOTE — Hospital Course (Addendum)
Davidjames Tunis Gentle is a 56 y.o. male with a PMH significant for type 2 diabetes, hyperlipidemia, prior osteomyelitis. Patient has no PCP and unable to afford purchase medications or transportation to obtain medications or to go to PCP appointments which interferes with his chronic treatment adherence and likely directly resulted in needing hospitalization. They presented from home to the ED on 01/01/2022 with infected left foot ulcer worsening x several days. Systemic symptoms included abdominal pain, nausea. 10/28-10/29: In the ED, it was found that they had hypertensive at 143/99, with heart rate of 123.  He was afebrile. Significant findings included glucose of 549, lactic acidosis of 2.5.  Left foot x-ray obtained that demonstrates acute osteomyelitis. Started IV Abx. Podiatry was consulted 10/30: L transmetatarsal amputation performed  10/31: stable, PT/OT rec SNF. Pend cultures 11/01: Cultures (+)proteus pan-sensitive. Stable but no insurance = unable to arrange SNF, pt prefers to go [facility] w/ [facility] so this is in process to arrange HH/DME through charity sources  11/02: TOC confirmed HH/DME in place and pt was discharged - went over detailed instructions re: meds, follow-ups   Consultants:  podiatry  Procedures: 01/03/22: L transmetatarsal amputation       ASSESSMENT & PLAN:   Principal Problem:   Acute osteomyelitis ([place]) Active Problems:   Hyperglycemic crisis in diabetes mellitus ([place])   Uncontrolled type 2 diabetes mellitus with hyperglycemia, without long-term current use of insulin ([place])   AKI (acute kidney injury) ([place])   Hypertension   EtOH dependence ([place])   Cellulitis of left foot  Acute osteomyelitis ([place]) proximal and distal phalanges of left great toe, 5th phalanx and 5th metatarsal head, and soft tissue involvement. Signs of probable osteomyelitis of the second and third digits as well S/p amputation 10/30 Can d/c on 10 days PO abx per culture data --> Augmentin  + Doxy F/U 1 week in clinic w/ Dr Sherryle Lis - per notes, office will call for f/u  PT/OT rec SNF but barriers to access this w/ no insurance, TOC to arrange [facility] and DME   Hyperglycemic crisis in diabetes mellitus ([place])-  A1c this admission 14.4. Basal and meal time insulin on discharge  diabetes education Stressed importance of outpatient f/u    AKI (acute kidney injury) ([place]) - resolved Repeat BMP as needed   Pseudo-Hyponatremia d/t hyperglycemia - persists BMP am continue to improve glucose control   Hypertension initiated losartan in setting of chronic comorbidities and normalized kidney function which has improved   Access to care barriers-  TOC engaged for assistance with medications, PCP, and transportation to medical appointments after dc

## 2022-01-05 NOTE — Progress Notes (Signed)
Occupational Therapy Treatment Patient Details Name: Cody Cowan MRN: [id number] DOB: 11-Oct-1965 Today's Date: 01/05/2022   History of present illness Pt admitted for acute osteo on L 5th toe. 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 History of DM, HLD and previous osteo.   OT comments  Upon entering session, pt semi-supine in bed and agreeable to OT. Family present throughout. Spanish interpreter, [place], used during session [phone]). Tx session targeted improving tolerance for functional mobility in the setting of ADL tasks. Pt completed bed mobility with Mod I, stand pivot transfer from EOB<>BSC with Min guard using RW, and seated grooming tasks with set up-supervision. Pt able to maintain LLE NWB status with Min VC during OOB mobility. Pt left as received with all needs in reach. Pt is making progress toward goal completion. D/C recommendation remains appropriate. OT will continue to follow acutely.     Recommendations for follow up therapy are one component of a multi-disciplinary discharge planning process, led by the attending physician.  Recommendations may be updated based on patient status, additional functional criteria and insurance authorization.    Follow Up Recommendations  Skilled nursing-short term rehab (<3 hours/day)    Assistance Recommended at Discharge Frequent or constant Supervision/Assistance  Patient can return home with the following  A lot of help with walking and/or transfers;Assist for transportation;Help with stairs or ramp for entrance;Assistance with cooking/housework;A little help with bathing/dressing/bathroom   Equipment Recommendations  Other (comment) (defer to next venue of care)    Recommendations for Other Services      Precautions / Restrictions Precautions Precautions: Fall Restrictions Weight Bearing Restrictions: Yes LLE Weight Bearing: Non weight bearing       Mobility Bed Mobility Overal bed mobility: Modified  Independent             General bed mobility comments: safe technique, no physical assistance required    Transfers Overall transfer level: Needs assistance Equipment used: Rolling walker (2 wheels) Transfers: Bed to chair/wheelchair/BSC   Stand pivot transfers: Min guard         General transfer comment: VC for hand placement, able to maintain WBing precautions with Min VC     Balance Overall balance assessment: Needs assistance Sitting-balance support: Feet supported Sitting balance-Leahy Scale: Good     Standing balance support: Bilateral upper extremity supported Standing balance-Leahy Scale: Fair Standing balance comment: during SPT                           ADL either performed or assessed with clinical judgement   ADL Overall ADL's : Needs assistance/impaired     Grooming: Wash/dry face;Set up;Sitting;Oral care;Supervision/safety                   Toilet Transfer: Min guard;Stand-pivot;Rolling walker (2 wheels);BSC/3in1                  Extremity/Trunk Assessment Upper Extremity Assessment Upper Extremity Assessment: Overall WFL for tasks assessed   Lower Extremity Assessment Lower Extremity Assessment: Generalized weakness        Vision Patient Visual Report: No change from baseline     Perception     Praxis      Cognition Arousal/Alertness: Awake/alert Behavior During Therapy: WFL for tasks assessed/performed Overall Cognitive Status: Within Functional Limits for tasks assessed  Exercises      Shoulder Instructions       General Comments      Pertinent Vitals/ Pain       Pain Assessment Pain Assessment: 0-10 Pain Score: 4  Pain Location: L foot Pain Descriptors / Indicators: Operative site guarding Pain Intervention(s): Limited activity within patient's tolerance, Monitored during session, Repositioned  Home Living                                           Prior Functioning/Environment              Frequency  Min 2X/week        Progress Toward Goals  OT Goals(current goals can now be found in the care plan section)  Progress towards OT goals: Progressing toward goals  Acute Rehab OT Goals Patient Stated Goal: to go home OT Goal Formulation: With patient Time For Goal Achievement: 01/18/22 Potential to Achieve Goals: Good  Plan Discharge plan remains appropriate;Frequency needs to be updated    Co-evaluation                 AM-PAC OT "6 Clicks" Daily Activity     Outcome Measure   Help from another person eating meals?: A Little (set up) Help from another person taking care of personal grooming?: A Little Help from another person toileting, which includes using toliet, bedpan, or urinal?: A Little Help from another person bathing (including washing, rinsing, drying)?: A Lot Help from another person to put on and taking off regular upper body clothing?: A Little Help from another person to put on and taking off regular lower body clothing?: A Lot 6 Click Score: 16    End of Session Equipment Utilized During Treatment: Gait belt;Rolling walker (2 wheels)  OT Visit Diagnosis: Unsteadiness on feet (R26.81);Muscle weakness (generalized) (M62.81)   Activity Tolerance Patient tolerated treatment well   Patient Left in bed;with call bell/phone within reach;with bed alarm set;with family/visitor present   Nurse Communication Mobility status        Time: 4765-4650 OT Time Calculation (min): 18 min  Charges: OT General Charges $OT Visit: 1 Visit OT Treatments $Self Care/Home Management : 8-22 mins  [facility] MS, OTR/L ascom [phone]  01/05/22, 4:26 PM

## 2022-01-05 NOTE — TOC Progression Note (Addendum)
Transition of Care [facility]) - Progression Note    Patient Details  Name: Cody Cowan MRN: [id number] Date of Birth: Jul 07, 1965  Transition of Care [facility]) CM/SW [place], RN Phone Number: 01/05/2022, 2:25 PM  Clinical Narrative:     Requested the Spanish interpreter to review [facility] information as well as [facility] and charity HH with [place], Also will review DME needed   Met with the patient  with the interpreter,  I provided him with the [facility] application in [place], I explained that he can get his medication at the [facility]  He will get [facility] thru [place], He will get a Sports administrator thru [employer] delivered to the bedside prior to DC Is sister will transport him home    Expected Discharge Plan and Services                                                 Social Determinants of Health (SDOH) Interventions    Readmission Risk Interventions     No data to display

## 2022-01-05 NOTE — TOC Progression Note (Signed)
Transition of Care [facility]) - Progression Note    Patient Details  Name: Cody Cowan MRN: [id number] Date of Birth: 04-18-1965  Transition of Care [facility]) CM/SW Contact  Laurena Slimmer, RN Phone Number: 01/05/2022, 8:42 AM  Clinical Narrative:    Spoke with patient at bedside with interpretor [phone] [facility]  Patient does not have a PCP. Provided information for [facility]. Patient was advised he could obtain medications here prior to leaving the hospital and to complete and return the application for [facility] for ongoing support.         Expected Discharge Plan and Services                                                 Social Determinants of Health (SDOH) Interventions    Readmission Risk Interventions     No data to display

## 2022-01-05 NOTE — Progress Notes (Cosign Needed)
Patient suffers from osteomyelitis, non weight bearing which impairs their ability to perform daily activities like ADLs  the home.  A walking aide  will not resolve issue with performing activities of daily living. A wheelchair will allow patient to safely perform daily activities. Patient can safely propel the wheelchair in the home or has a caregiver who can provide assistance. Length of need 6 mths. Accessories: elevating leg rests (ELRs), wheel locks, extensions and anti-tippers back cushion.

## 2022-01-05 NOTE — Plan of Care (Signed)

## 2022-01-05 NOTE — Inpatient Diabetes Management (Addendum)
Inpatient Diabetes Program Recommendations  AACE/ADA: New Consensus Statement on Inpatient Glycemic Control (2015)  Target Ranges:  Prepandial:   less than 140 mg/dL      Peak postprandial:   less than 180 mg/dL (1-2 hours)      Critically ill patients:  140 - 180 mg/dL   Lab Results  Component Value Date   GLUCAP 372 (H) 01/05/2022   HGBA1C 14.4 (H) 01/01/2022    Review of Glycemic Control  Latest Reference Range & Units 01/04/22 08:00 01/04/22 11:40 01/04/22 11:43 01/04/22 17:24 01/05/22 08:12  Glucose-Capillary 70 - 99 mg/dL 362 (H) 403 (H) 356 (H) 206 (H) 372 (H)  (H): Data is abnormally high   Current orders for Inpatient glycemic control: Semglee 20 units QD, Novolog 0-15 units TID  Inpatient Diabetes Program Recommendations:    Semglee 20 units BID Novolog 4 units TID with meals if consumes at least 50% Novolog 0-5 units QHS  Will continue to follow while inpatient.  Thank you, Reche Dixon, MSN, [place] Diabetes Coordinator Inpatient Diabetes Program [phone] (team pager from 8a-5p)

## 2022-01-06 ENCOUNTER — Other Ambulatory Visit: Payer: Self-pay

## 2022-01-06 LAB — CULTURE, BLOOD (ROUTINE X 2)
Culture: NO GROWTH
Culture: NO GROWTH
Special Requests: ADEQUATE
Special Requests: ADEQUATE

## 2022-01-06 LAB — SURGICAL PATHOLOGY

## 2022-01-06 LAB — GLUCOSE, CAPILLARY
Glucose-Capillary: 307 mg/dL — ABNORMAL HIGH (ref 70–99)
Glucose-Capillary: 289 mg/dL — ABNORMAL HIGH (ref 70–99)

## 2022-01-06 MED ORDER — OXYCODONE HCL 5 MG PO TABS: 5.0000 mg | ORAL_TABLET | Freq: Four times a day (QID) | ORAL | 0 refills | Status: AC | PRN

## 2022-01-06 MED ORDER — INSULIN LISPRO (1 UNIT DIAL) 100 UNIT/ML (KWIKPEN)
10.0000 [IU] | PEN_INJECTOR | Freq: Three times a day (TID) | SUBCUTANEOUS | 0 refills | Status: AC
  Filled 2022-01-06: qty 15, 50d supply, fill #0

## 2022-01-06 MED ORDER — AMOXICILLIN-POT CLAVULANATE 875-125 MG PO TABS
1.0000 | ORAL_TABLET | Freq: Two times a day (BID) | ORAL | 0 refills | Status: AC
  Filled 2022-01-06: qty 14, 7d supply, fill #0

## 2022-01-06 MED ORDER — BLOOD GLUCOSE MONITOR KIT
PACK | 0 refills | Status: AC
  Filled 2022-01-06: qty 1, fill #0

## 2022-01-06 MED ORDER — LOSARTAN POTASSIUM 50 MG PO TABS
50.0000 mg | ORAL_TABLET | Freq: Every day | ORAL | 0 refills | Status: AC
  Filled 2022-01-06: qty 30, 30d supply, fill #0

## 2022-01-06 MED ORDER — UNIFINE PENTIPS PLUS 31G X 5 MM MISC
0 refills | Status: AC
  Filled 2022-01-06: qty 100, fill #0
  Filled 2022-01-06: qty 100, 25d supply, fill #0

## 2022-01-06 MED ORDER — UNIFINE PENTIPS 32G X 4 MM MISC: 30.0000 [IU] | Freq: Two times a day (BID) | 1 refills | Status: AC

## 2022-01-06 MED ORDER — ATORVASTATIN CALCIUM 40 MG PO TABS
40.0000 mg | ORAL_TABLET | Freq: Every day | ORAL | 0 refills | Status: AC
  Filled 2022-01-06: qty 30, 30d supply, fill #0

## 2022-01-06 MED ORDER — DOXYCYCLINE HYCLATE 100 MG PO TABS
100.0000 mg | ORAL_TABLET | Freq: Two times a day (BID) | ORAL | 0 refills | Status: AC
  Filled 2022-01-06: qty 14, 7d supply, fill #0

## 2022-01-06 MED ORDER — LOSARTAN POTASSIUM 50 MG PO TABS
50.0000 mg | ORAL_TABLET | Freq: Every day | ORAL | Status: DC
  Administered 2022-01-06: 50 mg via ORAL
  Filled 2022-01-06: qty 1

## 2022-01-06 MED ORDER — BASAGLAR KWIKPEN 100 UNIT/ML ~~LOC~~ SOPN
30.0000 [IU] | PEN_INJECTOR | Freq: Every day | SUBCUTANEOUS | 0 refills | Status: AC
  Filled 2022-01-06: qty 30, 100d supply, fill #0

## 2022-01-06 MED ORDER — FENOFIBRATE 145 MG PO TABS
145.0000 mg | ORAL_TABLET | Freq: Every day | ORAL | 0 refills | Status: AC
  Filled 2022-01-06: qty 30, 30d supply, fill #0

## 2022-01-06 NOTE — Plan of Care (Signed)

## 2022-01-06 NOTE — Plan of Care (Signed)

## 2022-01-06 NOTE — Inpatient Diabetes Management (Signed)
Inpatient Diabetes Program Recommendations  AACE/ADA: New Consensus Statement on Inpatient Glycemic Control (2015)  Target Ranges:  Prepandial:   less than 140 mg/dL      Peak postprandial:   less than 180 mg/dL (1-2 hours)      Critically ill patients:  140 - 180 mg/dL   Lab Results  Component Value Date   GLUCAP 307 (H) 01/06/2022   HGBA1C 14.4 (H) 01/01/2022    Review of Glycemic Control  Latest Reference Range & Units 01/05/22 08:12 01/05/22 12:16 01/05/22 16:24 01/06/22 08:37  Glucose-Capillary 70 - 99 mg/dL 372 (H) 374 (H) 293 (H) 307 (H)  (H): Data is abnormally high  Diabetes history: DM2 Outpatient Diabetes medications: None, was taking basal insulin BID and stopped in May when her ran out Current orders for Inpatient glycemic control: Semglee 20 units QD, Novolog 0-15 units TID  Inpatient Diabetes Program Recommendations:    Glucose is consistently elevated in the 300's.  Please consider:  Semglee 25 units BID Novolog 0-5 QHS  Will continue to follow while inpatient.  Thank you, Reche Dixon, MSN, [place] Diabetes Coordinator Inpatient Diabetes Program [phone] (team pager from 8a-5p)

## 2022-01-06 NOTE — Discharge Summary (Signed)
Physician Discharge Summary   Patient: Cody Cowan MRN: [id number]  DOB: 05-Jun-1965   Admit:     Date of Admission: 01/01/2022 Admitted from: home   Discharge: Date of discharge: 01/06/22 Disposition: Home health Condition at discharge: fair  CODE STATUS: FULL CODE     Discharge Physician: Emeterio Reeve, DO [facility]     PCP: Patient, No Pcp Per  Recommendations for Outpatient Follow-up:  Follow up with PCP w/ [facility] asap Please obtain labs/tests: CBC, CMP at primary care visit  Follow w/ Podiatry  Please follow up on the following pending results: none PCP AND OTHER OUTPATIENT PROVIDERS: SEE [place] AVS PATIENT INFO    Discharge Instructions     Diet - low sodium heart healthy   Complete by: As directed    Discharge wound care:   Complete by: As directed    Keep clean and dry, follow with podiatry in 1 week   Increase activity slowly   Complete by: As directed          Discharge Diagnoses: Principal Problem:   Acute osteomyelitis ([place]) Active Problems:   Hyperglycemic crisis in diabetes mellitus ([place])   Uncontrolled type 2 diabetes mellitus with hyperglycemia, without long-term current use of insulin ([place])   AKI (acute kidney injury) ([place])   Hypertension   EtOH dependence ([place])   Cellulitis of left foot       Hospital Course: Cody Cowan is a 56 y.o. male with a PMH significant for type 2 diabetes, hyperlipidemia, prior osteomyelitis. Patient has no PCP and unable to afford purchase medications or transportation to obtain medications or to go to PCP appointments which interferes with his chronic treatment adherence and likely directly resulted in needing hospitalization. They presented from home to the ED on 01/01/2022 with infected left foot ulcer worsening x several days. Systemic symptoms included abdominal pain,  nausea. 10/28-10/29: In the ED, it was found that they had hypertensive at 143/99, with heart rate of 123.  He was afebrile. Significant findings included glucose of 549, lactic acidosis of 2.5.  Left foot x-ray obtained that demonstrates acute osteomyelitis. Started IV Abx. Podiatry was consulted 10/30: L transmetatarsal amputation performed  10/31: stable, PT/OT rec SNF. Pend cultures 11/01: Cultures (+)proteus pan-sensitive. Stable but no insurance = unable to arrange SNF, pt prefers to go home w/ [facility] so this is in process to arrange HH/DME through charity sources  11/02: TOC confirmed HH/DME in place and pt was discharged - went over detailed instructions re: meds, follow-ups   Consultants:  podiatry  Procedures: 01/03/22: L transmetatarsal amputation       ASSESSMENT & PLAN:   Principal Problem:   Acute osteomyelitis ([place]) Active Problems:   Hyperglycemic crisis in diabetes mellitus ([place])   Uncontrolled type 2 diabetes mellitus with hyperglycemia, without long-term current use of insulin ([place])   AKI (acute kidney injury) ([place])   Hypertension   EtOH dependence ([place])   Cellulitis of left foot  Acute osteomyelitis ([place]) proximal and distal phalanges of left great toe, 5th phalanx and 5th metatarsal head, and soft tissue involvement. Signs of probable osteomyelitis of the second and third digits as well S/p amputation 10/30 Can d/c on 10 days PO abx per culture data --> Augmentin + Doxy F/U 1 week in clinic w/ Dr Sherryle Lis - per notes, office will call for f/u  PT/OT rec SNF but barriers to access this  w/ no insurance, TOC to arrange [facility] and DME   Hyperglycemic crisis in diabetes mellitus ([place])-  A1c this admission 14.4. Basal and meal time insulin on discharge  diabetes education Stressed importance of outpatient f/u    AKI (acute kidney injury) ([place]) - resolved Repeat BMP as needed   Pseudo-Hyponatremia d/t hyperglycemia - persists BMP am continue to improve glucose  control   Hypertension initiated losartan in setting of chronic comorbidities and normalized kidney function which has improved   Access to care barriers-  TOC engaged for assistance with medications, PCP, and transportation to medical appointments after dc            Discharge Instructions  Allergies as of 01/06/2022   No Known Allergies      Medication List     STOP taking these medications    metFORMIN 500 MG 24 hr tablet Commonly known as: GLUCOPHAGE-XR       TAKE these medications    amoxicillin-clavulanate 875-125 MG tablet Commonly known as: AUGMENTIN Take 1 tablet by mouth every 12 (twelve) hours for 7 days.   atorvastatin 40 MG tablet Commonly known as: Lipitor Tome 1 tableta (40 mg en total) por va oral diariamente. (Take 1 tablet (40 mg total) by mouth daily.)   Basaglar KwikPen 100 UNIT/ML Inject 30 Units into the skin daily. What changed: when to take this   blood glucose meter kit and supplies Kit Dispense based on patient and insurance preference. Use up to four times daily as directed.   doxycycline 100 MG tablet Commonly known as: VIBRA-TABS Take 1 tablet (100 mg total) by mouth 2 (two) times daily.   fenofibrate 145 MG tablet Commonly known as: TRICOR Tome 1 tableta (145 mg en total) por va oral diariamente (Take 1 tablet (145 mg total) by mouth daily.)   HumaLOG KwikPen 100 UNIT/ML KwikPen Generic drug: insulin lispro Inject 10 Units into the skin 3 (three) times daily with meals.   losartan 50 MG tablet Commonly known as: COZAAR Tome 1 tableta (50 mg en total) por va oral diariamente. (Take 1 tablet (50 mg total) by mouth daily.)   oxyCODONE 5 MG immediate release tablet Commonly known as: Roxicodone Take 1 tablet (5 mg total) by mouth every 6 (six) hours as needed for severe pain.   Unifine Pentips 32G X 4 MM Misc Generic drug: Insulin Pen Needle use with insulin pens What changed: Another medication with the same  name was added. Make sure you understand how and when to take each.   Unifine Pentips 31G X 5 MM Misc Generic drug: Insulin Pen Needle use with basaglar and humalog What changed: You were already taking a medication with the same name, and this prescription was added. Make sure you understand how and when to take each.               Durable Medical Equipment  (From admission, onward)           Start     Ordered   01/05/22 1451  For home use only DME standard manual wheelchair with seat cushion  Once       Comments: Patient suffers from osteomyelitis, non weight bearing which impairs their ability to perform daily activities like ADLs  the home.  A walking aide  will not resolve issue with performing activities of daily living. A wheelchair will allow patient to safely perform daily activities. Patient can safely propel the wheelchair in the home or has a caregiver who  can provide assistance. Length of need 6 mths. Accessories: elevating leg rests (ELRs), wheel locks, extensions and anti-tippers back cushion.   01/05/22 1452              Discharge Care Instructions  (From admission, onward)           Start     Ordered   01/06/22 0000  Discharge wound care:       Comments: Keep clean and dry, follow with podiatry in 1 week   01/06/22 1046              No Known Allergies   Subjective: pt reports foot is sore but otherwise no concerns    Discharge Exam: BP 95/69 (BP Location: Left Arm)   Pulse 96   Temp 98.6 F (37 C)   Resp 16   Ht 5' 10" (1.778 m)   Wt 53.5 kg   SpO2 100%   BMI 16.93 kg/m  General: Pt is alert, awake, not in acute distress Cardiovascular: RRR, S1/S2 +, no rubs, no gallops Respiratory: CTA bilaterally, no wheezing, no rhonchi Abdominal: Soft, NT, ND, bowel sounds + Extremities: no edema, no cyanosis     The results of significant diagnostics from this hospitalization (including imaging, microbiology, ancillary and  laboratory) are listed below for reference.     Microbiology: Recent Results (from the past 240 hour(s))  Culture, blood (Routine x 2)     Status: None   Collection Time: 01/01/22 12:25 PM   Specimen: BLOOD  Result Value Ref Range Status   Specimen Description BLOOD RIGHT FOREARM  Final   Special Requests   Final    BOTTLES DRAWN AEROBIC AND ANAEROBIC Blood Culture adequate volume   Culture   Final    NO GROWTH 5 DAYS Performed at [facility], [address], [place], [place] [address]    Report Status 01/06/2022 FINAL  Final  Culture, blood (Routine x 2)     Status: None   Collection Time: 01/01/22 12:29 PM   Specimen: BLOOD  Result Value Ref Range Status   Specimen Description BLOOD LEFT ARM  Final   Special Requests   Final    BOTTLES DRAWN AEROBIC AND ANAEROBIC Blood Culture adequate volume   Culture   Final    NO GROWTH 5 DAYS Performed at [facility], [address], [place], [place] [address]    Report Status 01/06/2022 FINAL  Final  Aerobic/Anaerobic Culture w Gram Stain (surgical/deep wound)     Status: None (Preliminary result)   Collection Time: 01/03/22  5:51 PM   Specimen: PATH Other; Tissue  Result Value Ref Range Status   Specimen Description   Final    WOUND Performed at [facility], [address], [place], [place] [address]    Special Requests   Final    LEFT FOOT DEEP TISSUE Performed at [facility], [address] [place], [place] [address]    Gram Stain   Final    RARE WBC PRESENT,BOTH PMN AND MONONUCLEAR ABUNDANT GRAM POSITIVE RODS MODERATE GRAM POSITIVE COCCI MODERATE GRAM NEGATIVE RODS    Culture   Final    FEW PROTEUS MIRABILIS CULTURE REINCUBATED FOR BETTER GROWTH HOLDING FOR POSSIBLE ANAEROBE Performed at [facility], [place] [address] [place], [place] [address]    Report Status PENDING  Incomplete   Organism ID, Bacteria PROTEUS MIRABILIS  Final      Susceptibility   Proteus mirabilis  - MIC*  AMPICILLIN <=2 SENSITIVE Sensitive     CEFAZOLIN 8 SENSITIVE Sensitive     CEFEPIME <=0.12 SENSITIVE Sensitive     CEFTAZIDIME <=1 SENSITIVE Sensitive     CEFTRIAXONE <=0.25 SENSITIVE Sensitive     CIPROFLOXACIN <=0.25 SENSITIVE Sensitive     GENTAMICIN <=1 SENSITIVE Sensitive     IMIPENEM 2 SENSITIVE Sensitive     TRIMETH/SULFA <=20 SENSITIVE Sensitive     AMPICILLIN/SULBACTAM <=2 SENSITIVE Sensitive     PIP/TAZO <=4 SENSITIVE Sensitive     * FEW PROTEUS MIRABILIS     Labs: BNP (last 3 results) No results for input(s): "BNP" in the last 8760 hours. Basic Metabolic Panel: Recent Labs  Lab 01/01/22 1225 01/02/22 0437 01/03/22 0355 01/04/22 0552 01/05/22 0504  NA 130* 131* 132* 130* 130*  K 4.5 3.8 3.8 4.5 3.9  CL 88* 94* 96* 98 99  CO2 _0 GLUCOSE 549* 299* 310* 426* 463*  BUN _1 23* 20  CREATININE 1.01 0.67 0.70 0.73 0.77  CALCIUM 9.9 9.1 9.2 9.0 8.9   Liver Function Tests: Recent Labs  Lab 01/01/22 1225 01/02/22 0437  AST 29 23  ALT 23 16  ALKPHOS 187* 144*  BILITOT 1.1 0.8  PROT 9.3* 6.8  ALBUMIN 3.9 2.8*   No results for input(s): "LIPASE", "AMYLASE" in the last 168 hours. No results for input(s): "AMMONIA" in the last 168 hours. CBC: Recent Labs  Lab 01/01/22 1225 01/02/22 0437  WBC 8.2 7.5  NEUTROABS 5.8 4.5  HGB 12.6* 10.2*  HCT 36.5* 29.5*  MCV 89.2 89.4  PLT 388 277   Cardiac Enzymes: No results for input(s): "CKTOTAL", "CKMB", "CKMBINDEX", "TROPONINI" in the last 168 hours. BNP: Invalid input(s): "POCBNP" CBG: Recent Labs  Lab 01/05/22 0812 01/05/22 1216 01/05/22 1624 01/06/22 0837 01/06/22 1122  GLUCAP 372* 374* 293* 307* 289*   D-Dimer No results for input(s): "DDIMER" in the last 72 hours. Hgb A1c No results for input(s): "HGBA1C" in the last 72 hours. Lipid Profile No results for input(s): "CHOL", "HDL", "LDLCALC", "TRIG", "CHOLHDL", "LDLDIRECT" in the last 72 hours. Thyroid function studies No  results for input(s): "TSH", "T4TOTAL", "T3FREE", "THYROIDAB" in the last 72 hours.  Invalid input(s): "FREET3" Anemia work up No results for input(s): "VITAMINB12", "FOLATE", "FERRITIN", "TIBC", "IRON", "RETICCTPCT" in the last 72 hours. Urinalysis    Component Value Date/Time   COLORURINE STRAW (A) 01/01/2022 1700   APPEARANCEUR CLEAR (A) 01/01/2022 1700   LABSPEC 1.014 01/01/2022 1700   PHURINE 6.0 01/01/2022 1700   GLUCOSEU >=500 (A) 01/01/2022 1700   HGBUR LARGE (A) 01/01/2022 1700   BILIRUBINUR NEGATIVE 01/01/2022 1700   KETONESUR 20 (A) 01/01/2022 1700   PROTEINUR NEGATIVE 01/01/2022 1700   NITRITE NEGATIVE 01/01/2022 1700   LEUKOCYTESUR TRACE (A) 01/01/2022 1700   Sepsis Labs Recent Labs  Lab 01/01/22 1225 01/02/22 0437  WBC 8.2 7.5   Microbiology Recent Results (from the past 240 hour(s))  Culture, blood (Routine x 2)     Status: None   Collection Time: 01/01/22 12:25 PM   Specimen: BLOOD  Result Value Ref Range Status   Specimen Description BLOOD RIGHT FOREARM  Final   Special Requests   Final    BOTTLES DRAWN AEROBIC AND ANAEROBIC Blood Culture adequate volume   Culture   Final    NO GROWTH 5 DAYS Performed at [facility], [address] [place], [place] [address]    Report Status 01/06/2022 FINAL  Final  Culture, blood (  Routine x 2)     Status: None   Collection Time: 01/01/22 12:29 PM   Specimen: BLOOD  Result Value Ref Range Status   Specimen Description BLOOD LEFT ARM  Final   Special Requests   Final    BOTTLES DRAWN AEROBIC AND ANAEROBIC Blood Culture adequate volume   Culture   Final    NO GROWTH 5 DAYS Performed at [facility], [address], [place], [place] [address]    Report Status 01/06/2022 FINAL  Final  Aerobic/Anaerobic Culture w Gram Stain (surgical/deep wound)     Status: None (Preliminary result)   Collection Time: 01/03/22  5:51 PM   Specimen: PATH Other; Tissue  Result Value Ref Range Status   Specimen  Description   Final    WOUND Performed at [facility], [address], [place], [place] [address]    Special Requests   Final    LEFT FOOT DEEP TISSUE Performed at [facility], [address] [place], [place] [address]    Gram Stain   Final    RARE WBC PRESENT,BOTH PMN AND MONONUCLEAR ABUNDANT GRAM POSITIVE RODS MODERATE GRAM POSITIVE COCCI MODERATE GRAM NEGATIVE RODS    Culture   Final    FEW PROTEUS MIRABILIS CULTURE REINCUBATED FOR BETTER GROWTH HOLDING FOR POSSIBLE ANAEROBE Performed at [facility], [place] [address] [place], [place] [address]    Report Status PENDING  Incomplete   Organism ID, Bacteria PROTEUS MIRABILIS  Final      Susceptibility   Proteus mirabilis - MIC*    AMPICILLIN <=2 SENSITIVE Sensitive     CEFAZOLIN 8 SENSITIVE Sensitive     CEFEPIME <=0.12 SENSITIVE Sensitive     CEFTAZIDIME <=1 SENSITIVE Sensitive     CEFTRIAXONE <=0.25 SENSITIVE Sensitive     CIPROFLOXACIN <=0.25 SENSITIVE Sensitive     GENTAMICIN <=1 SENSITIVE Sensitive     IMIPENEM 2 SENSITIVE Sensitive     TRIMETH/SULFA <=20 SENSITIVE Sensitive     AMPICILLIN/SULBACTAM <=2 SENSITIVE Sensitive     PIP/TAZO <=4 SENSITIVE Sensitive     * FEW PROTEUS MIRABILIS   Imaging MR FOOT LEFT W WO CONTRAST  Result Date: 01/02/2022 CLINICAL DATA:  Ongoing left foot ulcers, clinical concern for osteomyelitis EXAM: MRI OF THE LEFT FOREFOOT WITHOUT AND WITH CONTRAST TECHNIQUE: Multiplanar, multisequence MR imaging of the left forefoot was performed both before and after administration of intravenous contrast. CONTRAST:  63m GADAVIST GADOBUTROL 1 MMOL/ML IV SOLN COMPARISON:  Radiographs 01/01/2022 FINDINGS: Despite efforts by the technologist and patient, prominent motion artifact is present on today's exam and could not be eliminated. This reduces exam sensitivity and specificity. Bones/Joint/Cartilage Prior fourth toe amputation at the level of the MTP joint. There is abnormal  edema and enhancement in the head of the fifth metatarsal and in the proximal phalanx of the fifth toe compatible with active osteomyelitis. Severe thinning of overlying cutaneous and subcutaneous structures potentially with lateral drainage as suggested on image 16 series 16. Associated substantial marrow enhancement. Active osteomyelitis in the proximal and distal phalanges of the great toe with accentuated T2 signal and associated enhancement. Extensive edema and some thinning in the soft tissues of the lateral great toe along the lateral margin of involvement near the interphalangeal joint. There is abnormal edema signal and osseous enhancement in the proximal phalanges of the second and third toes compatible with osteomyelitis. Ligaments The Lisfranc ligament appears intact. Muscles and Tendons Regional muscular atrophy, cannot exclude mild neurogenic edema. No  intramuscular abscess observed. Soft tissues Widespread subcutaneous edema and enhancement in the forefoot suggesting cellulitis. Possible sinus track extending from the fifth MTP joint laterally to the cutaneous surface. Small amount gas loculations in the plantar soft tissues at the great toe. IMPRESSION: 1. Active osteomyelitis of the fifth metatarsal head, proximal phalanx of the fifth toe, proximal and distal phalanges of the great toe, and proximal phalanges of the second and third toes. 2. Widespread subcutaneous edema and enhancement in the forefoot suggesting cellulitis. Possible sinus track extending from the fifth MTP joint to the cutaneous surface laterally. 3. Small amount of gas loculations in the plantar soft tissues at the great toe. 4. Regional muscular atrophy, cannot exclude mild neurogenic edema. 5. 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 Electronically Signed   By: Van Clines M.D.   On: 01/02/2022 09:16   DG Foot Complete Left  Result Date: 01/01/2022 CLINICAL DATA:  Foot infection for 1 week.  History  of diabetes. EXAM: LEFT FOOT - COMPLETE 3+ VIEW COMPARISON:  08/03/2021. FINDINGS: Soft tissue ulceration and air lies lateral to the fifth metatarsophalangeal joint. There is resorption along the lateral base of the fifth toe proximal phalanx and the adjacent lateral fifth metatarsal head consistent with osteomyelitis. There is additional soft tissue air along the proximal great toe, most evident laterally just proximal to the IP joint. There is more subtle cortical resorption along the lateral distal margin of the proximal phalanx, also consistent with osteomyelitis. No other convincing osteomyelitis.  No fracture. Previous amputation of the fourth toe. Joints are normally aligned. Small plantar and dorsal calcaneal spurs. IMPRESSION: 1. Osteomyelitis of the lateral base of the fifth toe proximal phalanx, lateral aspect of the fifth metatarsal head and more subtly along the lateral margin of the great toe proximal phalanx distally near the IP joint. Electronically Signed   By: Lajean Manes M.D.   On: 01/01/2022 13:54   DG Chest 2 View  Result Date: 01/01/2022 CLINICAL DATA:  Foot infection for a week.  Cough and chest pain. EXAM: CHEST - 2 VIEW COMPARISON:  01/14/2020. FINDINGS: Normal heart, mediastinum and hila. Clear lungs.  No pleural effusion or pneumothorax. Skeletal structures are intact. IMPRESSION: No active cardiopulmonary disease. Electronically Signed   By: Lajean Manes M.D.   On: 01/01/2022 13:01      Time coordinating discharge: over 30 minutes  SIGNED:  Emeterio Reeve DO [facility]

## 2022-01-07 LAB — AEROBIC/ANAEROBIC CULTURE W GRAM STAIN (SURGICAL/DEEP WOUND)
